# Patient Record
Sex: Male | Born: 1960 | Race: Black or African American | Hispanic: No | Marital: Single | Smoking: Former smoker
Health system: Southern US, Community
[De-identification: ages and names within clinical notes are randomized; demographics above are authoritative.]

## PROBLEM LIST (undated history)

## (undated) DIAGNOSIS — Z973 Presence of spectacles and contact lenses: Secondary | ICD-10-CM

## (undated) DIAGNOSIS — I779 Disorder of arteries and arterioles, unspecified: Secondary | ICD-10-CM

## (undated) DIAGNOSIS — E119 Type 2 diabetes mellitus without complications: Secondary | ICD-10-CM

## (undated) DIAGNOSIS — B1920 Unspecified viral hepatitis C without hepatic coma: Secondary | ICD-10-CM

## (undated) DIAGNOSIS — R519 Headache, unspecified: Secondary | ICD-10-CM

## (undated) DIAGNOSIS — I739 Peripheral vascular disease, unspecified: Secondary | ICD-10-CM

## (undated) DIAGNOSIS — I10 Essential (primary) hypertension: Secondary | ICD-10-CM

## (undated) DIAGNOSIS — G4700 Insomnia, unspecified: Secondary | ICD-10-CM

## (undated) DIAGNOSIS — M545 Low back pain, unspecified: Secondary | ICD-10-CM

## (undated) DIAGNOSIS — G8929 Other chronic pain: Secondary | ICD-10-CM

## (undated) DIAGNOSIS — E785 Hyperlipidemia, unspecified: Secondary | ICD-10-CM

## (undated) DIAGNOSIS — R51 Headache: Secondary | ICD-10-CM

## (undated) HISTORY — PX: COLONOSCOPY: SHX174

## (undated) HISTORY — PX: ANTERIOR CERVICAL DECOMP/DISCECTOMY FUSION: SHX1161

## (undated) HISTORY — DX: Insomnia, unspecified: G47.00

## (undated) HISTORY — PX: BACK SURGERY: SHX140

## (undated) HISTORY — DX: Peripheral vascular disease, unspecified: I73.9

## (undated) HISTORY — DX: Hyperlipidemia, unspecified: E78.5

## (undated) HISTORY — DX: Unspecified viral hepatitis C without hepatic coma: B19.20

## (undated) HISTORY — PX: CARPAL TUNNEL RELEASE: SHX101

## (undated) HISTORY — DX: Essential (primary) hypertension: I10

## (undated) HISTORY — DX: Disorder of arteries and arterioles, unspecified: I77.9

---

## 2003-11-15 ENCOUNTER — Emergency Department (HOSPITAL_COMMUNITY): Admission: EM | Admit: 2003-11-15 | Discharge: 2003-11-15 | Payer: Self-pay | Admitting: Emergency Medicine

## 2003-12-03 ENCOUNTER — Inpatient Hospital Stay (HOSPITAL_COMMUNITY): Admission: EM | Admit: 2003-12-03 | Discharge: 2003-12-05 | Payer: Self-pay | Admitting: Emergency Medicine

## 2003-12-06 ENCOUNTER — Emergency Department (HOSPITAL_COMMUNITY): Admission: EM | Admit: 2003-12-06 | Discharge: 2003-12-06 | Payer: Self-pay | Admitting: Family Medicine

## 2004-01-04 ENCOUNTER — Ambulatory Visit: Payer: Self-pay | Admitting: Family Medicine

## 2004-01-30 ENCOUNTER — Ambulatory Visit: Payer: Self-pay | Admitting: Family Medicine

## 2004-02-06 ENCOUNTER — Emergency Department (HOSPITAL_COMMUNITY): Admission: EM | Admit: 2004-02-06 | Discharge: 2004-02-06 | Payer: Self-pay | Admitting: Family Medicine

## 2004-02-20 ENCOUNTER — Ambulatory Visit: Payer: Self-pay | Admitting: Family Medicine

## 2004-02-26 ENCOUNTER — Ambulatory Visit: Payer: Self-pay | Admitting: Sports Medicine

## 2004-03-12 ENCOUNTER — Ambulatory Visit: Payer: Self-pay

## 2004-04-14 ENCOUNTER — Ambulatory Visit: Payer: Self-pay | Admitting: Family Medicine

## 2004-05-16 ENCOUNTER — Ambulatory Visit: Payer: Self-pay | Admitting: Sports Medicine

## 2004-07-17 ENCOUNTER — Ambulatory Visit: Payer: Self-pay | Admitting: Family Medicine

## 2004-10-02 ENCOUNTER — Ambulatory Visit: Payer: Self-pay | Admitting: Family Medicine

## 2004-10-27 ENCOUNTER — Ambulatory Visit: Payer: Self-pay | Admitting: Family Medicine

## 2004-11-26 ENCOUNTER — Ambulatory Visit: Payer: Self-pay | Admitting: Physical Medicine & Rehabilitation

## 2004-11-26 ENCOUNTER — Encounter
Admission: RE | Admit: 2004-11-26 | Discharge: 2005-02-24 | Payer: Self-pay | Admitting: Physical Medicine & Rehabilitation

## 2005-01-29 ENCOUNTER — Ambulatory Visit: Payer: Self-pay | Admitting: Physical Medicine & Rehabilitation

## 2005-02-18 ENCOUNTER — Ambulatory Visit: Payer: Self-pay | Admitting: Family Medicine

## 2005-02-20 ENCOUNTER — Ambulatory Visit: Payer: Self-pay | Admitting: Sports Medicine

## 2005-02-27 ENCOUNTER — Encounter
Admission: RE | Admit: 2005-02-27 | Discharge: 2005-05-28 | Payer: Self-pay | Admitting: Physical Medicine & Rehabilitation

## 2005-04-29 ENCOUNTER — Ambulatory Visit: Payer: Self-pay | Admitting: Physical Medicine & Rehabilitation

## 2005-05-06 ENCOUNTER — Ambulatory Visit: Payer: Self-pay | Admitting: Family Medicine

## 2005-05-15 ENCOUNTER — Ambulatory Visit: Payer: Self-pay | Admitting: Sports Medicine

## 2005-05-29 ENCOUNTER — Ambulatory Visit: Payer: Self-pay | Admitting: Family Medicine

## 2005-06-11 ENCOUNTER — Ambulatory Visit: Payer: Self-pay | Admitting: Physical Medicine & Rehabilitation

## 2005-06-11 ENCOUNTER — Encounter
Admission: RE | Admit: 2005-06-11 | Discharge: 2005-09-09 | Payer: Self-pay | Admitting: Physical Medicine & Rehabilitation

## 2005-07-14 ENCOUNTER — Ambulatory Visit: Payer: Self-pay | Admitting: Physical Medicine & Rehabilitation

## 2005-08-13 ENCOUNTER — Ambulatory Visit: Payer: Self-pay | Admitting: Physical Medicine & Rehabilitation

## 2005-10-08 ENCOUNTER — Ambulatory Visit: Payer: Self-pay | Admitting: Family Medicine

## 2005-10-08 ENCOUNTER — Ambulatory Visit: Payer: Self-pay | Admitting: Physical Medicine & Rehabilitation

## 2005-10-08 ENCOUNTER — Encounter
Admission: RE | Admit: 2005-10-08 | Discharge: 2006-01-06 | Payer: Self-pay | Admitting: Physical Medicine & Rehabilitation

## 2006-01-05 ENCOUNTER — Ambulatory Visit: Payer: Self-pay | Admitting: Physical Medicine & Rehabilitation

## 2006-01-05 ENCOUNTER — Encounter
Admission: RE | Admit: 2006-01-05 | Discharge: 2006-04-05 | Payer: Self-pay | Admitting: Physical Medicine & Rehabilitation

## 2006-03-30 ENCOUNTER — Ambulatory Visit: Payer: Self-pay | Admitting: Physical Medicine & Rehabilitation

## 2006-05-26 ENCOUNTER — Ambulatory Visit: Payer: Self-pay | Admitting: Physical Medicine & Rehabilitation

## 2006-05-26 ENCOUNTER — Encounter
Admission: RE | Admit: 2006-05-26 | Discharge: 2006-08-24 | Payer: Self-pay | Admitting: Physical Medicine & Rehabilitation

## 2006-07-01 DIAGNOSIS — E785 Hyperlipidemia, unspecified: Secondary | ICD-10-CM

## 2006-07-01 DIAGNOSIS — F17200 Nicotine dependence, unspecified, uncomplicated: Secondary | ICD-10-CM | POA: Insufficient documentation

## 2006-07-01 DIAGNOSIS — E782 Mixed hyperlipidemia: Secondary | ICD-10-CM | POA: Insufficient documentation

## 2006-07-01 DIAGNOSIS — E1159 Type 2 diabetes mellitus with other circulatory complications: Secondary | ICD-10-CM | POA: Insufficient documentation

## 2006-07-01 DIAGNOSIS — E119 Type 2 diabetes mellitus without complications: Secondary | ICD-10-CM | POA: Insufficient documentation

## 2006-07-01 DIAGNOSIS — E1169 Type 2 diabetes mellitus with other specified complication: Secondary | ICD-10-CM | POA: Insufficient documentation

## 2006-07-01 HISTORY — DX: Type 2 diabetes mellitus without complications: E11.9

## 2006-07-01 HISTORY — DX: Mixed hyperlipidemia: E78.2

## 2006-07-21 ENCOUNTER — Ambulatory Visit: Payer: Self-pay | Admitting: Physical Medicine & Rehabilitation

## 2006-09-14 ENCOUNTER — Ambulatory Visit: Payer: Self-pay | Admitting: Physical Medicine & Rehabilitation

## 2006-09-14 ENCOUNTER — Encounter
Admission: RE | Admit: 2006-09-14 | Discharge: 2006-12-13 | Payer: Self-pay | Admitting: Physical Medicine & Rehabilitation

## 2006-11-12 ENCOUNTER — Ambulatory Visit: Payer: Self-pay | Admitting: Physical Medicine & Rehabilitation

## 2007-01-14 ENCOUNTER — Ambulatory Visit: Payer: Self-pay | Admitting: Family Medicine

## 2007-01-14 LAB — CONVERTED CEMR LAB
Cholesterol, target level: 200 mg/dL
LDL Goal: 100 mg/dL

## 2007-01-21 ENCOUNTER — Ambulatory Visit: Payer: Self-pay | Admitting: Family Medicine

## 2007-01-21 ENCOUNTER — Encounter (INDEPENDENT_AMBULATORY_CARE_PROVIDER_SITE_OTHER): Payer: Self-pay | Admitting: Family Medicine

## 2007-01-21 LAB — CONVERTED CEMR LAB
BUN: 13 mg/dL (ref 6–23)
CO2: 21 meq/L (ref 19–32)
Cholesterol: 157 mg/dL (ref 0–200)
Creatinine, Ser: 1.07 mg/dL (ref 0.40–1.50)
Glucose, Bld: 100 mg/dL — ABNORMAL HIGH (ref 70–99)
HDL: 54 mg/dL (ref >39)
Sodium: 140 meq/L (ref 135–145)
Total Bilirubin: 0.3 mg/dL (ref 0.3–1.2)
Total Protein: 7.4 g/dL (ref 6.0–8.3)
Triglycerides: 135 mg/dL (ref <150)
VLDL: 27 mg/dL (ref 0–40)

## 2007-02-08 ENCOUNTER — Ambulatory Visit: Payer: Self-pay | Admitting: Physical Medicine & Rehabilitation

## 2007-02-08 ENCOUNTER — Encounter
Admission: RE | Admit: 2007-02-08 | Discharge: 2007-02-09 | Payer: Self-pay | Admitting: Physical Medicine & Rehabilitation

## 2007-02-16 ENCOUNTER — Ambulatory Visit: Payer: Self-pay | Admitting: Family Medicine

## 2007-03-25 ENCOUNTER — Ambulatory Visit: Payer: Self-pay | Admitting: Family Medicine

## 2007-05-03 ENCOUNTER — Encounter
Admission: RE | Admit: 2007-05-03 | Discharge: 2007-07-14 | Payer: Self-pay | Admitting: Physical Medicine & Rehabilitation

## 2007-05-03 ENCOUNTER — Ambulatory Visit: Payer: Self-pay | Admitting: Physical Medicine & Rehabilitation

## 2007-05-27 ENCOUNTER — Ambulatory Visit: Payer: Self-pay | Admitting: Family Medicine

## 2007-05-27 DIAGNOSIS — L280 Lichen simplex chronicus: Secondary | ICD-10-CM | POA: Insufficient documentation

## 2007-06-08 ENCOUNTER — Telehealth: Payer: Self-pay | Admitting: *Deleted

## 2007-06-13 ENCOUNTER — Ambulatory Visit: Payer: Self-pay | Admitting: Sports Medicine

## 2007-06-13 ENCOUNTER — Encounter (INDEPENDENT_AMBULATORY_CARE_PROVIDER_SITE_OTHER): Payer: Self-pay | Admitting: Family Medicine

## 2007-06-13 DIAGNOSIS — L0100 Impetigo, unspecified: Secondary | ICD-10-CM

## 2007-06-13 DIAGNOSIS — R042 Hemoptysis: Secondary | ICD-10-CM | POA: Insufficient documentation

## 2007-06-13 LAB — CONVERTED CEMR LAB
ALT: 58 units/L — ABNORMAL HIGH (ref 0–53)
AST: 35 units/L (ref 0–37)
Bilirubin, Direct: 0.1 mg/dL (ref 0.0–0.3)

## 2007-06-15 ENCOUNTER — Telehealth (INDEPENDENT_AMBULATORY_CARE_PROVIDER_SITE_OTHER): Payer: Self-pay | Admitting: Family Medicine

## 2007-06-24 ENCOUNTER — Encounter (INDEPENDENT_AMBULATORY_CARE_PROVIDER_SITE_OTHER): Payer: Self-pay | Admitting: *Deleted

## 2007-06-24 ENCOUNTER — Ambulatory Visit: Payer: Self-pay | Admitting: Family Medicine

## 2007-06-24 DIAGNOSIS — L02219 Cutaneous abscess of trunk, unspecified: Secondary | ICD-10-CM

## 2007-06-24 DIAGNOSIS — L03319 Cellulitis of trunk, unspecified: Secondary | ICD-10-CM

## 2007-06-25 ENCOUNTER — Encounter (INDEPENDENT_AMBULATORY_CARE_PROVIDER_SITE_OTHER): Payer: Self-pay | Admitting: *Deleted

## 2007-07-06 ENCOUNTER — Encounter (INDEPENDENT_AMBULATORY_CARE_PROVIDER_SITE_OTHER): Payer: Self-pay | Admitting: Family Medicine

## 2007-07-06 ENCOUNTER — Telehealth (INDEPENDENT_AMBULATORY_CARE_PROVIDER_SITE_OTHER): Payer: Self-pay | Admitting: Family Medicine

## 2007-07-21 ENCOUNTER — Encounter
Admission: RE | Admit: 2007-07-21 | Discharge: 2007-07-22 | Payer: Self-pay | Admitting: Physical Medicine & Rehabilitation

## 2007-07-21 ENCOUNTER — Ambulatory Visit: Payer: Self-pay | Admitting: Physical Medicine & Rehabilitation

## 2010-06-22 ENCOUNTER — Encounter: Payer: Self-pay | Admitting: *Deleted

## 2010-08-05 ENCOUNTER — Encounter: Payer: Self-pay | Admitting: Home Health Services

## 2010-08-27 ENCOUNTER — Encounter: Payer: Self-pay | Admitting: Home Health Services

## 2010-08-27 ENCOUNTER — Ambulatory Visit (INDEPENDENT_AMBULATORY_CARE_PROVIDER_SITE_OTHER): Payer: Medicare Other | Admitting: Home Health Services

## 2010-08-27 VITALS — BP 141/87 | HR 105 | Temp 97.9°F | Ht 67.0 in | Wt 211.8 lb

## 2010-08-27 DIAGNOSIS — Z0000 Encounter for general adult medical examination without abnormal findings: Secondary | ICD-10-CM

## 2010-08-27 NOTE — Progress Notes (Signed)
Patient here for annual wellness visit, patient reports: Risk Factors/Conditions needing evaluation or treatment: Patient does not have any new risk factors that need evaluation. Home Safety: Patient is currently in the process of purchasing a home in [place].  Other Information: Corrective lens: Patient has daily corrective lens and visits eye doctor as needed. Dentures: Patient does not have dentures and visits dentist as needed.  Memory: Patient reports some memory loss. Patient's Mini Mental Score (recorded in doc. flowsheet): 30   Balance max value patientvalue  Sitting balance 1 1  Arise 2 2  Attempts to arise 2 2  Immediate standing balance 2 2  Standing balance 1 1  Nudge 2 2  Eyes closed 1 1  360 degree turn 1 1  Sitting down 2 2   Gait max value patient value  Initiation of gait 1 1  Step length-left 1 1  Step length-right 1 1  Step height-left 1 1  Step height-right 1 1  Step symmetry 1 1  Step continuity 1 1  Path 2 2  Trunk 2 2  Walking stance 1 1   Balance/Gait Score: 26/26    Annual Wellness Visit Requirements Recorded Today In  Medical, family, social history Past Medical, Family, Social History Section  Current providers Care team  Current medications Medications  Wt, BP, Ht, BMI Vital signs  Hearing assessment (welcome visit) declined  Tobacco, alcohol, illicit drug use History  ADL Nurse Assessment  Depression Screening Nurse Assessment  Cognitive impairment Nurse Assessment  Mini Mental Status Document Flowsheet  Fall Risk Nurse Assessment  Home Safety Progress Note  End of Life Planning (welcome visit) Social Documentation  Medicare preventative services Progress Note  Risk factors/conditions needing evaluation/treatment Progress Note  Personalized health advice Patient Instructions, goals, letter  Diet & Exercise Social Documentation  Emergency Contact Social Documentation  Seat Belts Social Documentation  Sun exposure/protection  Social Documentation    Medicare Prevention Plan: Patient has not had an appointment with [facility] for past two years.  He will complete most of the preventative service upon a visit with his pcp.  Recommended Medicare Prevention Screenings Men over 50 Test For Frequency Date of Last- BOLD if needed  Colorectal Cancer 1-10 yrs Recommended when pt turns 50   Prostate Cancer Never or yearly Discuss with pcp  Aortic Aneurysm Once if 65-75 with hx of smoking Not indicated  Cholesterol 5 yrs 9/08  Diabetes yearly Will check with pcp  HIV yearly Declined  Influenza Shot yearly recommended  Pneumonia Shot once recommended  Zostavax Shot once recommended

## 2010-08-27 NOTE — Patient Instructions (Signed)
1. Congratulations on quitting smoking!  Keep up the great work! 2. Continue to take all your medicines regularly. 3. Start to exercise 2-3 times a week.

## 2010-09-11 ENCOUNTER — Ambulatory Visit (INDEPENDENT_AMBULATORY_CARE_PROVIDER_SITE_OTHER): Payer: Medicare Other | Admitting: Family Medicine

## 2010-09-11 ENCOUNTER — Encounter: Payer: Self-pay | Admitting: Family Medicine

## 2010-09-11 VITALS — BP 128/75 | HR 106 | Temp 97.4°F | Ht 67.0 in | Wt 215.3 lb

## 2010-09-11 DIAGNOSIS — Z20828 Contact with and (suspected) exposure to other viral communicable diseases: Secondary | ICD-10-CM

## 2010-09-11 DIAGNOSIS — E119 Type 2 diabetes mellitus without complications: Secondary | ICD-10-CM

## 2010-09-11 DIAGNOSIS — M549 Dorsalgia, unspecified: Secondary | ICD-10-CM

## 2010-09-11 DIAGNOSIS — R300 Dysuria: Secondary | ICD-10-CM

## 2010-09-11 DIAGNOSIS — Z202 Contact with and (suspected) exposure to infections with a predominantly sexual mode of transmission: Secondary | ICD-10-CM

## 2010-09-11 DIAGNOSIS — R7309 Other abnormal glucose: Secondary | ICD-10-CM

## 2010-09-11 DIAGNOSIS — Z113 Encounter for screening for infections with a predominantly sexual mode of transmission: Secondary | ICD-10-CM

## 2010-09-11 DIAGNOSIS — G8929 Other chronic pain: Secondary | ICD-10-CM

## 2010-09-11 DIAGNOSIS — R739 Hyperglycemia, unspecified: Secondary | ICD-10-CM

## 2010-09-11 NOTE — Patient Instructions (Signed)
We'll wait for the records from the pain clinic. I'll let you know the results from your test. Your A1C was 7.2.

## 2010-09-12 LAB — GC/CHLAMYDIA PROBE AMP, URINE
Chlamydia, Swab/Urine, PCR: NEGATIVE
GC Probe Amp, Urine: NEGATIVE

## 2010-09-12 LAB — RPR

## 2010-09-13 ENCOUNTER — Encounter: Payer: Self-pay | Admitting: Family Medicine

## 2010-09-14 NOTE — Progress Notes (Signed)
  Subjective:    Patient ID: Wyatt Sandoval, male    DOB: [birth year], 50 y.o.   MRN: [id number]  HPI Patient previously a patient here but has not been seen for past 3 years so filled out "new patient" paperwork.  Lived in [place] for past several years, now moving back to [place] and wants to establish care again.     1.  Diabetes:  Currently on Metformin.  No adverse effects.  No hypoglycemic events.  No paresthesia or peripheral nerve pain.  Measures blood sugars at home every:     Lab Results  Component Value Date   HGBA1C 7.2 09/11/2010      Review of Systems See HPI above for review of systems.       Objective:   Physical Exam Gen:  Alert, cooperative patient who appears stated age in no acute distress.  Vital signs reviewed. HEENT:  [place]/AT.  EOMI, PERRL.  MMM, tonsils non-erythematous, non-edematous.  External ears WNL, Bilateral TM's normal without retraction, redness or bulging.  Cardiac:  Regular rate and rhythm without murmur auscultated.  Good S1/S2. Pulm:  Clear to auscultation bilaterally with good air movement.  No wheezes or rales noted.   Abd:  Soft/nondistended/nontender.  Good bowel sounds throughout all four quadrants.  No masses noted.  MSK:  some tenderness along lumbosacral back bilaterally to palpation.   Ext:  No clubbing/cyanosis/erythema.  No edema noted bilateral lower extremities.      Assessment & Plan:

## 2010-09-15 DIAGNOSIS — G8929 Other chronic pain: Secondary | ICD-10-CM | POA: Insufficient documentation

## 2010-09-15 HISTORY — DX: Other chronic pain: G89.29

## 2010-09-15 NOTE — Assessment & Plan Note (Signed)
A1C 7.2, on Metformin.  Plan to continue current dose of Metformin, patient has been using infrequently.  Recheck A1C in 3 months, await for records from current PCP.

## 2010-09-15 NOTE — Assessment & Plan Note (Signed)
Awaiting records from [facility] pain clinic.  Patient asking for Oxycodone today but has prescription from pain clinic.  Takes high dose narcotics, will refer to pain clinic here rather than provide these.

## 2010-09-16 NOTE — Assessment & Plan Note (Signed)
Wyatt Sandoval returns to the clinic today for followup evaluation.  He  reports that he is getting reasonable relief from his methadone and his  oxycodone as noted below.  Does need refills on each of those over the  next couple days. He reports that he still has no primary care  physician, but plans to follow up with [facility] where  he has received prior medical attention.  He also needs to see an  ophthalmologist as he still reports vision difficulties, but he has set  no appointment at this time.  He reports his blood sugars has been as  low as 70 and occasionally are elevated above 200.   MEDICATIONS:  1. Trazodone 50 mg 3 tablets p.o. nightly p.r.n.  2. Methadone 10 mg 2 tablets b.i.d.  3. Oxycodone 5 mg 1-2 tablets p.o. t.i.d.  p.r.n.  (approximately 6      per day).  4. Lipitor 10 mg p.o. nightly.  5. Metformin 500 mg b.i.d.   REVIEW OF SYSTEMS:  Positive for weight gain.   PHYSICAL EXAMINATION:  A well-appearing, well-nourished adult male in  mild to no acute discomfort.  Blood pressure 119/70 with a pulse of 93,  respiratory rate 18 and O2 saturation 97% on room air.  He has 5/5 strength throughout. He ambulates without any assistive  device.   IMPRESSION:  1. Lumbar degenerative disc disease with probable S1 nerve root      irritation causing left lower extremity pain.  2. Cervical degenerative disc disease with history of cervical pain.    In the office today, we did refill the patient's methadone along with  his oxycodone as of November 19, 2006.  We will plan on seeing the patient  in followup in approximately 3 to 4 months' time with refills prior to  that appointment as necessary.           ______________________________  Ellwood Dense, M.D.     DC/MedQ  D:  11/15/2006 10:32:49  T:  11/15/2006 15:53:42  Job #:  [id number]

## 2010-09-16 NOTE — Progress Notes (Signed)
I have reviewed this visit and discussed with Suzanne Lineberry and agree with her documentation  

## 2010-09-16 NOTE — Assessment & Plan Note (Signed)
[name] returns to clinic today for follow-up evaluation.  Due to  the shortage of oxycodone, we have had to change him to Percocet 5/325  used 1-2 tablets p.o. t.i.d. p.r.n.  He does not feel that he is getting  the same amount of relief with the Percocet 5/325 as he was with the  oxycodone 5 mg.  I have tried to explain to him that the only difference  is the addition of Tylenol, but he is fairly sure that the relief is not  exactly the same.  He continues to take the methadone 2 tablets twice a  day.  His blood sugars have been under reasonable control according to  him.   MEDICATIONS:  Trazodone 50 mg 3 tablets p.o. q.h.s. p.r.n.; methadone 10  mg 2 tablets b.i.d.; Percocet 5/325 1-2 tablets p.o. t.i.d. p.r.n.;  Lipitor 10 mg p.o. q.h.s.; metformin 500 mg p.o. b.i.d.   REVIEW OF SYSTEMS:  Positive for high blood sugar.   PHYSICAL EXAMINATION:  Well-appearing, well-nourished adult male in mild  to no acute discomfort.  Blood pressure 119/54 with pulse 94.  Respiratory rate 18.  O2 saturation 96% on room air.  He has 5/5  strength throughout.  He ambulates without any assistive device.   IMPRESSION:  1. Lumbar degenerative disc disease with probable S1 nerve root      irritation causing left lower extremity pain.  2. Cervical degenerative disc disease with history of cervical pain.   In the office today we did switch the patient to a Percocet 7.5/325  instead of a 5/325.  It would still allow him the use of maximum of 6  per day.  We have also refilled his methadone and each was written for  August 05, 2007.  We will plan on seeing the patient in follow-up in  approximately 2-3 months' time with refills prior to that appointment as  necessary.           ______________________________  Ellwood Dense, M.D.     DC/MedQ  D:  07/22/2007 10:23:44  T:  07/22/2007 11:47:53  Job #:  [id number]

## 2010-09-16 NOTE — Assessment & Plan Note (Signed)
[name] returns to the clinic today for followup evaluation.  He  reports the [facility] has recommended that he  start taking blood pressure medicine and aspirin.  He has really not  filled those prescriptions and he is not sure that he wants to at the  present time.  His blood sugars have been in the 95-100 range with  occasional readings being the 50-60 range, tested first thing in the  morning.  He does not check his blood sugar other times during the day.   He is getting reasonable relief from the methadone and oxycodone and  needs refills on each of those over the next few days.   MEDICATIONS:  1. Trazodone 50 mg, three tablets p.o. q.h.s. p.r.n.  2. Methadone 10 mg two tablets b.i.d.  3. Oxycodone 5 mg one to two tablets p.o. t.i.d. p.r.n. (approximately      6 per day).  4. Lipitor 10 mg p.o. q.h.s.  5. Metformin 500 mg p.o. b.i.d.   REVIEW OF SYSTEMS:  Noncontributory.   PHYSICAL EXAMINATION:  GENERAL:  A well-appearing, well-nourished, adult  male in mild to no acute discomfort.  VITAL SIGNS:  Blood pressure 148/92, with a pulse of 89, respiratory  rate is 18, and O2 saturation 98% on room air.  MUSCULOSKELETAL:  He has 5/5 strength throughout.  He ambulates without  any assistive device.   IMPRESSION:  1. Lumbar degenerative disk disease with probable S1 nerve root      irritation causing left lower extremity  pain.  2. Cervical degenerative disk disease with a history of cervical pain.   1. In the office today, we did refill the patient's methadone along      with his oxycodone as of May 13, 2007.  2. I have asked him to check his blood sugar in the afternoon before      the evening meal to see if he as good control as he does  first thing in the morning.  1. I have also asked him to reconsider using the blood pressure      medicines since he has had occasional readings which have been      elevated in the office.  Other times his blood  pressure has been      well controlled off medication when we have seen him in followup.      He needs to make that decision on his own but it has been      recommended and he may be developing more hypertension lately with      the readings slightly more elevated.  2. We will plan to see the patient in followup in this office in      approximately 3 months' time with refills prior to that appointment      as necessary.           ______________________________  Ellwood Dense, M.D.     DC/MedQ  D:  05/06/2007 13:31:46  T:  05/06/2007 14:37:23  Job #:  [id number]

## 2010-09-16 NOTE — Assessment & Plan Note (Signed)
The patient returns to clinic today for followup evaluation.  He puts it  overall he is doing well.  He continues to use his methadone and  oxycodone - as noted below.  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  He  has lost 20 pounds as he has been more active.  He tries to stay as  active as possible even when he is not working.  He has not been  checking his blood sugars but knows he needs to get back in to see his  primary care physician for diabetes and blood pressure along with  cholesterol control.   MEDICATIONS:  1. Trazodone 50 mg, 3 tablets p.o. q. h.s. p.r.n.  2. Methadone 10 mg, 2 tablets p.o. b.i.d.  3. Oxycodone 5 mg, 1-2 tablets p.o. t.i.d. p.r.n. (approximately 6 per      day).  4. Lipitor 10 mg q. h.s.  5. Metformin 500 mg b.i.d.   REVIEW OF SYSTEMS:  Positive for high blood sugar.   PHYSICAL EXAMINATION:  A reasonably well-appearing, well-nourished,  adult male in mild to no acute discomfort.  Blood pressure is 117/55  with a pulse of 90, respiratory rate 16 and 02 saturation 96% on room  air.  He has 5/5 strength throughout.  He ambulates without any  assistive device.   IMPRESSION:  1. Lumbar degenerative disc disease with probable S1 nerve root      irritation causing left lower extremity pain.  2. Cervical degenerative disc disease with history of cervical pain.   PLAN:  In the office today, we did refill the patient's methadone and  his oxycodone - each as of Sep 23, 2006.  We will plan on seeing him in  followup in approximately 2-3 months' time with refills prior to that  appointment as necessary.  He continues to get good analgesic effect.  I  will allow him to return to work.  No significant adverse side effects  are noted.           ______________________________  Ellwood Dense, M.D.     DC/MedQ  D:  09/16/2006 10:41:51  T:  09/16/2006 11:59:40  Job #:  [id number]

## 2010-09-16 NOTE — Assessment & Plan Note (Signed)
[name] returns to clinic today for followup evaluation.  He  continues to get his primary care through [facility].  The patient reports that he is getting good relief from the methadone  and oxycodone as noted below.  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  He also reports that he tries to be  active, walking on a regular basis.  He reports that he has increased  pain when he is seated for any prolonged period of time, especially if  he is leaning towards the right.  He reports that his blood sugars have  been in the 95 to 100 range.   MEDICATIONS:  1. Trazodone 50 mg 3 tablets p.o. nightly p.r.n.  2. Methadone 10 mg 2 tablets b.i.d.  3. Oxycodone 5 mg 1-2 tablets p.o. t.i.d. p.r.n. (approximately 6 per      day).  4. Lipitor 10 mg p.o. nightly.  5. Metformin 500 mg p.o. b.i.d.   REVIEW OF SYSTEMS:  Noncontributory.   PHYSICAL EXAMINATION:  A well-appearing, well-nourished adult male in  mild, acute discomfort.  Blood pressure 104/48 with a pulse of 107, respiratory rate 18, and O2  saturation 96% on room air.  He has 5/5 strength throughout the bilateral upper and lower  extremities.  He has decreased range of motion of his lumbar spine.  He  ambulates without any assistive device.   IMPRESSION:  1. Lumbar degenerative disk disease with probable S1 nerve root      irritation causing left lower extremity pain.  2. Cervical degenerative disk disease with history of cervical pain.  3. In the office today, we did refill the patient's oxycodone and      methadone as of February 13, 2007.  Will plan on seeing the patient      in followup in this office in approximately 3 months' time with      refills prior to that appointment if necessary.           ______________________________  Ellwood Dense, M.D.     DC/MedQ  D:  02/09/2007 09:20:00  T:  02/09/2007 12:12:06  Job #:  [id number]

## 2010-09-17 ENCOUNTER — Other Ambulatory Visit: Payer: Self-pay | Admitting: Family Medicine

## 2010-09-17 DIAGNOSIS — M549 Dorsalgia, unspecified: Secondary | ICD-10-CM

## 2010-09-18 ENCOUNTER — Telehealth: Payer: Self-pay | Admitting: Family Medicine

## 2010-09-18 NOTE — Telephone Encounter (Signed)
I am going to fax this to another pain clinic.Wyatt Sandoval

## 2010-09-18 NOTE — Telephone Encounter (Signed)
April calling to let MD know their doctor reviewed pts chart, pt was referred there in the past by previous doctor & is declining to see pt there, he does not have anything to offer pt.

## 2010-09-19 NOTE — Assessment & Plan Note (Signed)
INTERVAL HISTORY:  Mr. Pottenger returns to clinic today for followup  evaluation.  I last saw the patient in this office April 30, 2005.  We  had been using oxycodone 5 mg one to two tablets p.o. t.i.d. p.r.n. a  maximum of six per day had been prescribed.  Unfortunately, the patient has  experienced substantially increased low back pain without any exacerbating  event.  He apparently has used 153 tablets in the past 13 days.  He has 27  remaining from a prescription dated May 31, 2005.  The patient is not  getting adequate relief with the oxycodone and would like to have his pain  medicines adjusted.  He reports that he is using the trazodone 50 mg three  tablets at night and only getting mild help with his sleep.  He also has  previously tried Lorcet, hydrocodone and Percocet without significant  benefit.   MEDICATIONS:  1.  Trazodone 50 mg three tablets p.o. at bedtime.  2.  Oxycodone 5 mg one to two tablets p.o. five to six times per day (10-12      per day).   REVIEW OF SYSTEMS:  Noncontributory.   PHYSICAL EXAMINATION:  Well-appearing fit adult male in mild acute  discomfort.  Blood pressure __________ .  Reflexes were 2+ and symmetrical  and sensation was intact to light touch throughout.  Lumbar range of motion  was decreased in all planes.   IMPRESSION:  1.  Lumbar degenerative disk disease with probable S1 nerve root irritation      causing lower extremity pain.  2.  Cervical degenerative disk disease with history of cervical pain.   In the office today we did refill the patient's oxycodone but asked him to  use no more than six tablets per day.  We have added oxycodone extended  release 20 mg one tablet q.12 h and that prescription is written for today.  The oxycodone that I mentioned above is written for June 15, 2005 as he  has 27 remaining from his last prescription.  I have told him that he needs  to limit the use of oxycodone   Dictation ended at this  point.           ______________________________  Ellwood Dense, M.D.     DC/MedQ  D:  06/12/2005 13:23:41  T:  06/12/2005 23:21:46  Job #:  [id number]

## 2010-09-19 NOTE — Assessment & Plan Note (Signed)
HISTORY OF PRESENT ILLNESS:  [name] returns to the clinic today for  follow up evaluation.  Overall, he is doing reasonably well.  He still gets  a good amount of relief with his methadone and oxycodone as noted below.  He  does still have persistent numbness in the toes of his left foot.  He does  need a refill on his Trazodone which he uses for his sleep.   The patient reports that his blood sugars have been in the 110 to 120 range  at home.   MEDICATIONS:  1. Trazodone 50 mg 3 tablets p.o. q.h.s.  2. Methadone 10 mg 2 tablets p.o. b.i.d.  3. Oxycodone 5 mg 1-2 tablet p.o. t.i.d. p.r.n. (approximately 2-6 per      day).  4. Lipitor 10 mg daily.  5. Metformin 500 mg b.i.d.   REVIEW OF SYSTEMS:  Noncontributory.   PHYSICAL EXAMINATION:  GENERAL APPEARANCE:  Well-appearing, well-nourished  adult male in mild acute discomfort.  VITAL SIGNS:  Blood pressure 152/70, pulse 113, respirations 18, O2  saturation 98% on room air.  EXTREMITIES:  He has 5/5 strength throughout the bilateral upper and lower  extremities.  Bulk and tone were normal.  Reflexes were 3+ and symmetrical.   IMPRESSION:  1. Lumbar degenerative disk disease with probable S1 nerve root irritation      causing left lower extremity pain.  2. Cervical degenerative disk disease with history of cervical pain.   PLAN:  In the office today, we did refill the patient's Oxycodone, methadone  and trazodone as noted above.  He is getting good analgesic effect, and  there is no sign of adverse side effects.  We plan on seeing him in follow  up in this office in approximately three months time with refills prior to  that appointment if necessary.           ______________________________  Ellwood Dense, M.D.     DC/MedQ  D:  01/06/2006 15:16:07  T:  01/07/2006 06:50:22  Job #:  [id number]

## 2010-09-19 NOTE — Group Therapy Note (Signed)
MEDICAL RECORD #[id number]   REFERRAL:  [name] (primary care physician).   PURPOSE OF EVALUATION:  Evaluate and treat chronic back pain and neck pain.   HISTORY OF THE PRESENT ILLNESS:  Wyatt Sandoval is a 50 year old adult male  referred to this office by his primary care physician for chronic pain  management.   I have some minimal medical records that I will review in preparation of  this dictation.   It appears that the patient was referred to [name], a local  neurosurgeon, November 26, 2003 on referral from the emergency room for low-back  pain. At that time, [name] requested an MRI scan of his lumbar spine  and prescribed the patient Vicodin and Motrin.   November 29, 2003 an MRI scan of the lumbar spine reportedly showed L5-S1  posterolateral disc protrusion on the left posteriorly, displacing the left  S1 nerve root. At L4-5 there was shallow extraforaminal disc protrusion on  the right which could encroach on the right L4 nerve root. There was mild  disc bulge at L3-4 without resulting spinal stenosis or nerve root  encroachment. There was mild facet degenerative changes bilaterally in the  lower three levels.   The patient was seen back by [name] December 11, 2003. At that time the  patient declined back injections. He did not wish for any type of surgery.  He was started on Voltaren and told to follow up with [name] to evaluate  his cervical spine. The patient had a history of multiple neck surgery with  persistent neck pain.   January 29, 2004 the patient saw [name], a local orthopedist. At that  time, [name] requested a cervical MRI scan. The patient was requesting  narcotics and [name] gave him Ultram t.i.d. p.r.n.   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 AP diameter of the canal from C4 to the  level of C5-6 is 7-8 mm with deformation at these levels. No abnormal cord  signal was identified.   February 13, 2004 [name] saw the patient in follow-up. He reported that  the MRI scan showed solid fusion at C3-4 and C6-7. Again, the patient  declined epidural steroid injections for the lumbar herniated nucleus  pulposus. They apparently discussed surgery but the patient decided to  decline. [name] had recommended no narcotic medications for this patient.   July 17, 2004 the patient saw [name] back and continued to complain of  neck pain. He was referred here at that time.   Since being seen by [name], the patient reports that he did see a Dr.  Ladon Applebaum at [facility] He was apparently prescribed hydrocodone  7.5/650 one tablet p.o. t.i.d. to q.i.d. as of approximately 2-3 months ago.  He has been using that and getting some relief.   Presently, the patient complains of low-back and cervical pain. He reports  that his pain is worse in his low back with radiation into his left leg. He  reports that he does get some relief with the hydrocodone. He also reports  very poor sleep despite using Ambien 10 mg q.h.s. He also reports having  used Ambien CR in the past. He reports getting no relief with the Ultram. He  reports that his blood sugars have been in the 100-120 range. He denies any  bowel or bladder incontinence. He reports that he tries to do what he can  around the home and tries to walk as much as he can tolerate. He complains  of a slight numbness of his left hand with good upper extremity strength. He  reports that his back is stiff, especially in the morning.   The patient subsequently almost casually reports that he has had abnormal  liver function tests. He is not sure exactly the cause of this but he  reports that he was told by an unknown physician that he had  abnormalities  of his liver.   PAST MEDICAL HISTORY:  1.  Non-insulin-dependent diabetes mellitus on Glucophage.  2.  Tobacco abuse.  3.  Dyslipidemia, on Lipitor.  4.  Cervical surgery x2 in 2000 and 2003.  5.  Reported abnormal liver function.   FAMILY HISTORY:  Positive for heart disease, hypertension, cerebral palsy.  His mother has cerebral palsy and is living. His dad is deceased from heart  disease.   ALLERGIES:  No known drug allergies.   SOCIAL HISTORY:  The patient is single although he has fathered three  children. He lives with a girlfriend at this time. He is on disability since  2005 for neck pain. He smokes one-and-a-half packs per day of cigarettes and  diagnosis alcohol intake.   MEDICATIONS:  1.  Hydrocodone 7.5/650 one tablet t.i.d. p.r.n.  2.  Ambien 10 mg q.h.s.  3.  Metformin 500 mg b.i.d.  4.  Lipitor 10 mg daily.   REVIEW OF SYSTEMS:  Positive for high blood sugar.   PHYSICAL EXAMINATION:  A reasonably well-appearing, moderately-overweight  adult male in mild to no acute discomfort. Blood pressure 114/64 with a  pulse of 94, respiratory rate 16, and O2 saturation 98% on room air. The  patient was pleasant and cooperative throughout the evaluation. He reports a  height of 5 feet 7 inches and a weight of 230 pounds. He is able to toe-walk  and heel-walk without difficulty. Lumbar range of motion was only minimally  decreased in flexion and good in extension. Lateral bending and rotation  were within normal limits. Cervical range of motion showed only slight  decrease in forward flexion and extension along with lateral bending and  rotation. Upper extremity range of motion was normal bilaterally. Upper  extremity exam showed 5-/5 strength throughout. Normal sensation was  reported and bulk and tone were normal. Reflexes are 2+ and symmetrical. The  patient did complain of some slight numbness of his left hand but then reported that his sensation was  normal in exam. Lower extremity exam showed  5/5 strength with slight decrease in sensation of the left instep. Reflexes  were 2+ at the bilateral knees and ankles. In the supine position, straight  leg raise was negative bilaterally. Hip range of motion was normal  bilaterally.   IMPRESSION:  1.  Lumbar degenerative disc disease without neurological compromise.  2.  Cervical degenerative disc disease with history of cervical surgery x2      with persistent pain.   In the office today, the patient certainly has abnormalities of his lumbar  spine as evidence by the November 29, 2003 MRI study. He has denied offering of  epidural steroid injections and [name] had also discussed surgery with  the patient but he is not interested in that. He has had less than full  benefit  from the cervical surgery and he is worried that the lumbar surgery  would be as problematic. The patient does get a fair amount of relief with  the hydrocodone but he also reports that he has had some liver  abnormalities. I have told him that we will need to get a liver function  test in the office today to see exactly what the status of his liver is  before we can prescription him any medication. He seems to actually want  stronger medication than the hydrocodone but I have told him that we need to  at least evaluate his liver, given the reports that he is giving [place] in the  office. I have offered to give him sleep medication and have written a  prescription for trazodone 50 mg one to two tablets p.o. q.h.s. I have told  him that we will see him follow-up in approximately 1 month's time to review  his liver function tests at that time to see if we can treat him more  aggressively than he is at present. Will plan on seeing him in follow-up in  approximately 1 month's time.       DC/MedQ  D:  11/28/2004 12:13:24  T:  11/28/2004 15:16:05  Job #:  [id number]

## 2010-09-19 NOTE — Assessment & Plan Note (Signed)
MEDICAL RECORD NUMBER:  [id number]   [name] returns to clinic today for follow-up evaluation. He reports  that he is getting reasonable relief with his oxycodone used 5 mg one to two  tablets p.o. t.i.d. He has occasionally used greater than six tablets per  day but for the most part uses up to six per day. 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 He needs to be certified as disabled,  according to the patient, before the vocational people will help him get a  future job which does not seem to make sense, but that is what he tells me.  He continues to smoke two packs of cigarettes per day and asks about  possible Nicorette gum for smoking cessation.   MEDICATIONS:  1.  Trazodone 50 mg three tablets p.o. q.h.s.  2.  Oxycodone 5 mg one to two tablets p.o. t.i.d. p.r.n. (maximum six per      day).   REVIEW OF SYSTEMS:  Positive for high blood sugar.   PHYSICAL EXAMINATION:  Well-appearing, fit adult male in mild acute  discomfort. Blood pressure 110/69 with a pulse of 75, respiratory rate 16,  and O2 saturation 99% on room air. He has 5/5 strength throughout the  bilateral upper and lower extremities. Reflexes were 2+ and symmetrical. He  ambulates without any assistive device.   IMPRESSION:  1.  Lumbar degenerative disc disease with probable S1 nerve root irritation      causing lower extremity pain.  2.  Cervical degenerative disc disease with history of cervical surgery x2      with persistent pain.   In the office today we did refill the patient's oxycodone at 5 mg one to two  tablets p.o. t.i.d. p.r.n., a total of 180. We also prescribed Nicorette  chewing gum 4 mg one every 2 hours, approximately eight to nine pieces per  day. He understands that he should not smoke when he uses the Nicorette gum.  We will plan on seeing the patient in follow-up in approximately 2-3 months'   time.           ______________________________  Ellwood Dense, M.D.     DC/MedQ  D:  04/30/2005 10:09:18  T:  04/30/2005 11:28:13  Job #:  [id number]

## 2010-09-19 NOTE — H&P (Signed)
NAME:  Wyatt Sandoval, Wyatt Sandoval NO.:  [id number]   MEDICAL RECORD NO.:  [id number]                   PATIENT TYPE:  INP   LOCATION:  3315                                 FACILITY:  [facility]   PHYSICIAN:  Jackie Plum, M.D.             DATE OF BIRTH:  [birth year]   DATE OF ADMISSION:  12/03/2003  DATE OF DISCHARGE:                                HISTORY & PHYSICAL   CHIEF COMPLAINT:  Polyuria, polydipsia and weakness.   HISTORY OF PRESENT ILLNESS:  The patient is a 50 year old African American  gentleman without any prior history of diabetes mellitus.  He presented with  2-week history of progressively worsening polyuria, polydipsia and visual  blurring.  Apparently, he had had some abdominal pain which was nonspecific  and complained of this to the ED staff, however, at the time of my  evaluation, the patient denied any abdominal pain whatsoever and did not  have any nausea or vomiting, though he had vomited once previously.  He said  that he was apparently in his usual state of health until about a week ago,  when he started having worsening neck and back pain (he has history of a  herniated disk).  At that time, he was started on prednisone with other  medicines.  Since then, he has been having problems with polyuria,  polydipsia and subsequent visual blurring.  He denies any history of nausea,  loss of appetite, diarrhea, constipation, weight loss, black or bloody  stools or dysuria.  He denies any history of fever or chills, headaches,  sore throat, cough, chest pain, shortness of breath, any skin rash or joint  pains.   PAST MEDICAL HISTORY:  Past medical history is notable for history of  herniated disk, status post cervical surgery in [place] in 2003.  He  denies any history of diabetes mellitus, heart disease or high blood  pressure.   CURRENT MEDICINES:  His current medicines include hydrocodone and prednisone  as well as a muscle relaxant.   These medications were prescribed for him by  Dr. Vanita Panda. Magnus Ivan, who also requested for an MRI for further  evaluation of his spine problems.  MRI report is not available in the  computer at [facility] at the time of my evaluation.   ALLERGIES:  He does not have any known medication allergies.   FAMILY HISTORY:  Family history is positive for heart disease in his  grandmother.  His grandfather had heart disease and diabetes mellitus as  well.   SOCIAL HISTORY:  The patient is divorced, has 3 children.  He does not drink  alcohol.  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  He smokes 1-1/2 packs of cigarettes per day for more than  20 years.   REVIEW OF SYSTEMS:  Significant positive and negatives as stated in HPI but  other review of systems  was unremarkable.   PHYSICAL EXAMINATION:  VITAL SIGNS:  BP was 140/95, temperature 97.8 degrees  Fahrenheit, heart rate of 98, respiratory rate of 20, saturation of 96% on  room air.  GENERAL:  During his admission, he was not acutely toxic-looking.  He was in  no acute cardiopulmonary distress.  HEENT:  Normocephalic, atraumatic.  Pupils were equal, round and reactive to  light.  Extraocular movements were intact.  His oropharynx was dry.  He did  not have any exudation or tumor of his pharynx.  NECK:  Neck was supple with no JVD.  LUNGS:  The lungs were clear to auscultation.  CARDIAC:  Exam was notable for mildly tachycardic without any gallops or  murmur.  ABDOMEN:  Abdomen was obese.  It was soft and nontender.  Bowel sounds were  present and normoactive.  EXTREMITIES:  He did not have any edema.  Dorsalis pedis pulses were  present.  They were adequate.  NEUROLOGIC:  The patient was alert and oriented x3 with no acute focal  deficits.   LABORATORY WORK:  His pH is 7.349, PCO2 of 51.9, bicarbonate of 28.6.  He  has a WBC of 10.4, hemoglobin 18.7, __________ , hematocrit is 63.0, MCV of   78.7, platelet count of 224,000.  Sodium was 119, potassium 6.6, chloride  94, glucose more than 700, CO2 26, BUN 51, creatinine 1.7.  Calcium 10.6.  Acetone negative; specimen was said to be hemolyzed.   IMPRESSION:  1. Hyperosmolar hyperglycemic state.  There is no evidence of diabetic     ketoacidosis.  2. Dehydration with prerenal azotemia.  3. Hyponatremia, likely pseudo-hyponatremia secondary to hyperglycemia.  4. Polycythemia likely secondary to dehydration.  5. Hyperkalemia probably related to hemolyzed specimen.   PLAN OF CARE:  The patient will be admitted to a telemetry bed.  We will  continue Glucomander protocol that has been initiated in the ED, however, we  will switch from DKA protocol to hyperglycemic protocol.  We will monitor  his electrolytes carefully and hydrate him adequately.  We will check his  hemoglobin A1c, his UA for urine protein.  The patient does not have a  primary care physician and we will take the opportunity to obtain a lipid  panel.  We will also obtain a 12-lead EKG for completeness-sake.  The  patient surely has diabetes mellitus and therefore he is newly diagnosed and  he will need diabetes nurse educator to see the patient and also possibly  outpatient followup with diabetes center on discharge.  The patient states  that he was supposed to have seen [name] tomorrow, so Dr. Eliberto Ivory  office will be notified so that if he desires, he may see the patient while  he is in the hospital.  On discharge, he will have an appointment for  dilated eye exam by ophthalmology as well as other routine diabetic care  including podiatrist care.                                                Jackie Plum, M.D.    GO/MEDQ  D:  12/04/2003  T:  12/04/2003  Job:  [id number]   cc:   Vanita Panda. Magnus Ivan, M.D.  Fax: [phone]

## 2010-09-19 NOTE — Assessment & Plan Note (Signed)
Wyatt Sandoval returns to the clinic today for follow up evaluation. He  reports that he is doing reasonably well over all. He continues to get  benefit from the methadone and Oxycodone as noted below. He does need  refills on both of those in the office today. He still has increased  pain in his back after he is an awkward position for any prolonged  period of time, especially while seated. He reports that his blood  sugars have been generally less than 126.   MEDICATIONS:  1. Trazodone 50 mg three tablets p.o. q.h.s. p.r.n.  2. Methadone 10 mg two tablets b.i.d.  3. Oxycodone 5 mg 1-2 tablets p.o. t.i.d. p.r.n. (approximately 6 per      day).  4. Lipitor 10 mg q.s.  5. Metformin 500 mg b.i.d.   REVIEW OF SYSTEMS:  Noncontributory.   PHYSICAL EXAMINATION:  A well-appearing, well-nourished adult male in  mild to no acute discomfort. Blood pressure 127/58 with a pulse of 97,  respiratory rate 16 and O2 saturation 99% on room air. He has 5/5  strength throughout the bilateral upper and lower extremities. Reflexes  were 2+ and symmetrical. Lumbar range of motion was decreased especially  in flexion.   IMPRESSION:  1. Lumbar degenerative disc disease with probable S1 nerve root      irritation causing left lower extremity pain.  2. Cervical degenerative disc disease with history of cervical pain.   In the office today we did refill the patient's methadone along with his  Oxycodone as of May 29, 2006. We will plan on seeing the patient in  follow up in approximately two months time with refills prior to that  appointment as necessary.           ______________________________  Ellwood Dense, M.D.     DC/MedQ  D:  05/27/2006 09:28:36  T:  05/27/2006 09:50:14  Job #:  [id number]

## 2010-09-19 NOTE — Assessment & Plan Note (Signed)
SUBJECTIVE:  [name] returns to the clinic today for followup  evaluation. He reports that he does have increased numbness and tingling  of his left leg, especially in the calf and heel region when he leans  towards the left side while seated. This appears to put more pressure on  the S1 nerve root where he has had degenerative changes present. He  reports that his blood sugars have been in the 150 to 180 range. He had  been seen by family physicians at [facility], but had not  been back to see them yet. The patient does need a refill on his  methadone and oxycodone over the next few days.   MEDICATIONS:  1. Trazodone 50 mg 3 tablets p.o. nightly p.r.n.  2. Methadone 10 mg 2 tablets b.i.d.  3. Oxycodone 5 mg 1 to 2 tablets p.o. t.i.d. p.r.n. (approximately 6      per day)  4. Lipitor 10 mg nightly  5. Metformin 500 mg b.i.d.   REVIEW OF SYSTEMS:  Noncontributory.   PHYSICAL EXAMINATION:  GENERAL:  Well-appearing, well-nourished adult  male in mild to no acute discomfort.  VITAL SIGNS:  Blood pressure 145/55 with a pulse of 91, respiratory rate  16 and O2 saturation 99% on room air.  EXTREMITIES:  He had 5/5 strength throughout. Bulk and tone were normal.   IMPRESSION:  1. Lumbar degenerative disc disease with probable S1 nerve root      irritation causing left lower extremity pain  2. Cervical degenerative disc disease with history of cervical pain   PLAN:  In the office today, we did refill the patient's methadone and  oxycodone each as of March 25th, 2008. We will plan on seeing the  patient in followup in this office in approximately 2 to 3 months' time  with refills prior to that appointment as necessary.           ______________________________  Wyatt Sandoval, M.D.     DC/MedQ  D:  07/22/2006 10:27:03  T:  07/22/2006 11:47:20  Job #:  [id number]
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
## 2010-09-19 NOTE — Assessment & Plan Note (Signed)
MEDICAL RECORD NUMBER:  [id number].   Mr. Glance returns to clinic today for followup evaluation. I last saw the  patient in this office January 30, 2005 for evaluation and treatment of  chronic neck and low back pain. We had decided to try him on Lyrica at 75 mg  daily. He reports that he did not get any relief with that medication. We  also did switch him from hydrocodone to oxycodone, and he was allowed to  take 5 mg 1 to 2 tablets p.o. t.i.d. p.r.n. He reports that he is getting a  fair amount of relief from that medicine and would like to continue that. He  also uses the trazodone at 150 mg nightly, and I have told him that I cannot  recommend using a chronic sleeping medication as it will build up dependency  on his part. We have decided to increase his trazodone medication. We have  also decided to discontinue the Lyrica since he was not getting any benefit,  and he was not really anxious to go up on the dose.   MEDICATIONS:  1.  Trazodone 50 mg 3 tablets p.o. nightly.  2.  Oxycodone 5 mg 1 to 2 tablets p.o. t.i.d. p.r.n. maximum 6 per day.   REVIEW OF SYSTEMS:  Positive for high blood sugar.   PHYSICAL EXAMINATION:  Well appearing fit adult male in no acute discomfort.  Blood pressure 130/64 with a pulse of 100, respiratory rate 16 and O2  saturation 100% on room air. He has 5/5 strength throughout the bilateral  upper and lower extremities. Bulk and tone were normal. Reflexes were 2+ and  symmetrical. Sensation was intact to light touch throughout the bilateral  upper and lower extremities. He ambulates without any assistive device.   IMPRESSION:  1.  Lumbar degenerative disk disease with probable S1 nerve root irrigation      causing lower extremity pain.  2.  Cervical degenerative disk disease with history of cervical surgery x2      with persistent pain.   In the office today, we did refill the patient's oxycodone at 5 mg as  written above. We also increased his  trazodone to 4 tablets p.o. nightly for  a total 200 mg as needed. We have asked him to discontinue his Lyrica, asked  him to follow up in two months' time. We will refill his medications as  necessary one month from now.           ______________________________  Ellwood Dense, M.D.     DC/MedQ  D:  03/02/2005 13:58:30  T:  03/02/2005 14:21:27  Job #:  [id number]

## 2010-09-19 NOTE — Assessment & Plan Note (Signed)
HISTORY OF PRESENT ILLNESS:  Mr. Ruffini returned to the clinic today for  follow up evaluation.  I last saw the patient in this office June 12, 2005.  At that time, we had tried to switch his pain medications, especially  the long term medication that he had been on.  When we first saw him in July  2006, we had him taking hydrocodone 7.5/650 one tablet t.i.d..  That has  been progressed to Oxycodone, but when I saw him in February 2007, he was  taking as many as 10-12 Oxycodone per day.  At that time, we started him on  OxyContin Extended Release 20 mg q.12 h.  He had someone to call us back  saying that his insurance would not pay for the OxyContin but they would pay  for morphine.  At that time, then, we started him on morphine sulfate  extended release 15 mg q.12 h.  He is coming into the office today saying  that he is not liking the side effects, especially the sexual dysfunction  that he feels is related to the morphine.  He would like to be tried on a  different long-term medication.  He continues to take the oxycodone  immediate release, up to approximately six tablets per day.   MEDICATIONS:  1.  Trazodone 50 mg three tablets p.o. q.h.s.  2.  Oxycodone 5 mg 1-2 tablets p.o. t.i.d. p.r.n.  3.  Morphine sulfate extended release 15 mg q.12 h.   REVIEW OF SYSTEMS:  Noncontributory.   PHYSICAL EXAMINATION:  GENERAL APPEARANCE:  Well-appearing, fit, adult male  in mild to no acute discomfort.  VITAL SIGNS:  Blood pressure 113/63, pulse 97, respirations 16, O2  saturation 98% on room air.  He has decreased lumbar range of motion in all  planes.  Upper and lower extremity strength was generally 5-/5 with normal  bulk and tone.   IMPRESSION:  1.  Lumbar degenerative disk disease with probable S1 nerve root irritation      causing lower extremity pain.  2.  Cervical degenerative disk disease with history of cervical pain.   PLAN:  In the office today, we did refill the  patient's oxycodone immediate  release at 5 mg 1-2 tablet p.o. t.i.d. p.r.n.  In place of his morphine  sulfate, which was in place of his OxyContin, we decided to start him on  methadone 10 mg two tablet p.o. b.i.d.  We will see how that does as his  long term pain relieving medicine.  Hopefully, we can get him down to maybe  using only that and not requiring any extra Oxycodone.  He has been told to  try to keep the Oxycodone down to maximum of six per day only on an as  needed basis.  We will plan on seeing him in follow up in approximately two  months time.  We did discard the morphine sulfate extended release that he  brings into the office today stating he does not like the side effects as  noted above.           ______________________________  Ellwood Dense, M.D.     DC/MedQ  D:  08/14/2005 12:31:12  T:  08/14/2005 14:18:03  Job #:  [id number]

## 2010-09-19 NOTE — Assessment & Plan Note (Signed)
MEDICAL RECORD NUMBER:  [id number].   Mr. Nall returns to clinic today for followup evaluation. He is a 50-year-  old adult male who I first and last saw in this office November 28, 2004 for  evaluation and treatment of chronic neck and low back pain. At the initial  visit, we had decided not to give him any pain medicines as he was reporting  abnormal liver function. We did obtain liver function tests on that day,  and they came back within normal limits apart from a SGPT of 60. It was also  positive for opiates on a urine drug screen but was taking hydrocodone at  that time.   The patient reports that he still has numbness of the posterior portion of  his left leg in the calf and foot region. Has a MRI scan dated from July  2005 which showed L5-S1 posterior lateral disk protrusion on the left,  displacing the left SI nerve root. There were also some degenerative changes  with disk protrusion at L3-4 and L4-5. He also has a history of cervical  fusion done twice in the past, and [name] has seen him for that problem.  He mostly complains of pain of his left leg at this time. The patient is on  disability.   MEDICATIONS:  Trazodone 50 mg 3 tablets p.o. nightly.   REVIEW OF SYSTEMS:  Noncontributory.   PHYSICAL EXAMINATION:  Well-appearing, adult male in mild acute discomfort.  Blood pressure 122/66 with a pulse of 96, respiratory rate 16, and O2  saturation 100% on room air. He has 5-/5 strength throughout the bilateral  upper and lower extremities. Bulk and tone were normal, and reflexes were 2+  and symmetrical. Sensation was intact to light touch throughout the upper  and lower extremities. The patient ambulates without any assistive device.   IMPRESSION:  1.  Lumbar degenerative disk disease with probably S1 nerve root irritation      causing lower extremity pain.  2.  Cervical degenerative disk disease with history of cervical surgery x2      with persistent pain.   In the  office today, we did start the patient on Lyrica 75 mg daily. We have  given him samples in the office today and asked him to try that over the  next month. We have decided to not use the hydrocodone secondary to the  Tylenol that is present combined with the hydrocodone. In place of that, we  have stared him on oxycodone 5 mg to be used 1 to 2 tablets p.o. t.i.d.  p.r.n. I have asked him to use it only as absolutely necessary and up to a  maximum of six tablets per day. I have encouraged him to use only what is  absolutely needed for control of his pain. We have also give him a refill on  his trazodone 50 mg 3 tablets p.o. nightly. I have asked him to be as active  as possible and to get out and do at least walking on a daily basis. We will  plan on seeing him in followup in approximately one month's time to see how  he does on a combination of the oxycodone and Lyrica.           ______________________________  Ellwood Dense, M.D.     DC/MedQ  D:  01/30/2005 10:05:44  T:  01/30/2005 11:12:31  Job #:  [id number]

## 2010-09-19 NOTE — Assessment & Plan Note (Signed)
HISTORY:  Mr. Hoogendoorn returns today for followup evaluation.  Overall, he is  doing well.  When we last saw him in this office in April 2007, we switched  him to methadone which was in place of morphine sulfate which was in place  of OxyContin.  On the methadone, he used 10 mg two tablets twice a day and  reports that he is getting good relief.  He also uses oxycodone 5 mg one to  two tablets p.o. t.i.d. for break-through pain.  He needs a refill on both  of those in the office today.  He reports that he is getting good analgesic  effect.  He is not having any adverse side effects and tolerating the  medicine well.   MEDICATIONS:  1.  Trazodone 50 mg three tablets p.o. q.h.s.  2.  Oxycodone 5 mg one to two tablets p.o. t.i.d. p.r.n. (approximately 2 to      4 per day).  3.  Methadone 10 mg two tablets p.o. b.i.d.  4.  Lipitor 10 mg daily.  5.  Metformin 500 mg b.i.d.   REVIEW OF SYSTEMS:  Positive for high blood sugar.   PHYSICAL EXAMINATION:  Well-appearing, fit adult male in mild to no acute  discomfort.  Blood pressure 146/80, pulse 89, respiratory rate 16, O2  saturation 96% on room air.  He has 5/5 strength in bilateral upper  extremities and lower extremities.  Bulk and tone normal.   IMPRESSION:  1.  Lumbar degenerative disk disease with probable S1 nerve root irritation      causing lower extremity pain.  2.  Cervical degenerative disk disease with history of cervical pain.   In the office today, we did refill the patient's OxyContin and methadone as  of today.  Will plan on seeing him in followup in approximately two months'  time with refills prior to that appointment as necessary at one month's  time.           ______________________________  Ellwood Dense, M.D.     DC/MedQ  D:  11/09/2005 13:26:44  T:  11/09/2005 15:29:51  Job #:  [id number]

## 2010-09-30 ENCOUNTER — Telehealth: Payer: Self-pay | Admitting: Family Medicine

## 2010-09-30 NOTE — Telephone Encounter (Signed)
Wants to know if other medical records has come in b/c he is wanting to be referred to pain clinic.

## 2010-10-01 NOTE — Telephone Encounter (Signed)
LVM for patient to call back. ?

## 2010-10-02 NOTE — Telephone Encounter (Signed)
Spoke with patient and informed him that I re-[facility] He has agreed with me that this is fine and that if we don't hear anything or if he doesn't by Monday/Tuesday of next week a follow up phone call with center will be made

## 2010-10-02 NOTE — Telephone Encounter (Signed)
Spoke with patient and he was asking about his pain clinic referral. Informed him that referral was sent in 5/17. Haven't heard anything so I will follow up with pain clinic and give him an update

## 2010-10-02 NOTE — Telephone Encounter (Signed)
Faxed out referral again

## 2010-10-07 NOTE — Telephone Encounter (Signed)
Pt called back to see if we have rec'd records.  Was told to call back this week.

## 2010-10-08 NOTE — Telephone Encounter (Signed)
Going to [facility] due to a [facility] call from [facility] stating that patient was seen there and they don't have anything to offer patient

## 2010-10-14 NOTE — Telephone Encounter (Signed)
Wants to know if an appt has been for pain clinic.

## 2010-10-15 ENCOUNTER — Telehealth: Payer: Self-pay | Admitting: Family Medicine

## 2010-10-15 NOTE — Telephone Encounter (Signed)
[facility] faxed over refill request [phone], has not gotten a response and patient is out of all of his meds.  And needs to know the status of referral to pain clinic.

## 2010-10-16 NOTE — Telephone Encounter (Signed)
Called and lvm to inform pt that the referral for pain clinic has been sent and they will call him with the time and date of his appt. Looked for the refill request and could not find it asked pt to call pharmacy to have them to fax request again.Marland KitchenMarland KitchenLoralee Pacas [place]

## 2010-10-17 ENCOUNTER — Other Ambulatory Visit: Payer: Self-pay | Admitting: *Deleted

## 2010-10-17 DIAGNOSIS — E785 Hyperlipidemia, unspecified: Secondary | ICD-10-CM

## 2010-10-17 DIAGNOSIS — M549 Dorsalgia, unspecified: Secondary | ICD-10-CM

## 2010-10-17 MED ORDER — SIMVASTATIN 40 MG PO TABS: 40.0000 mg | ORAL_TABLET | Freq: Every day | ORAL | Status: DC

## 2010-10-17 MED ORDER — METFORMIN HCL 500 MG PO TABS: 500.0000 mg | ORAL_TABLET | Freq: Two times a day (BID) | ORAL | Status: DC

## 2010-10-17 MED ORDER — AMITRIPTYLINE HCL 75 MG PO TABS: 75.0000 mg | ORAL_TABLET | Freq: Every day | ORAL | Status: DC

## 2010-10-17 NOTE — Assessment & Plan Note (Signed)
Received fax request for 40 mg simvastatin refill - yet our records indicated he was on 80 mg dose.  Given recent info that 80 mg dose has more myositis, will go with 40 mg dose.

## 2010-10-17 NOTE — Assessment & Plan Note (Signed)
Refilled neuropathic pain med via fax request

## 2010-10-17 NOTE — Telephone Encounter (Signed)
Dr. Leveda Anna gave 6 refills on metformin rx

## 2010-12-05 ENCOUNTER — Telehealth: Payer: Self-pay | Admitting: Family Medicine

## 2010-12-05 NOTE — Telephone Encounter (Signed)
Pt has never heard anything about pain clinic referral - pls advise

## 2010-12-08 ENCOUNTER — Telehealth: Payer: Self-pay | Admitting: Family Medicine

## 2010-12-08 NOTE — Telephone Encounter (Signed)
Pt moved to [place] from [place] and pharmacy here needs new scripts in order to fill - needs it called to [facility] Metformin Zocor Elavil  Also hasn't heard about pain clinic yet.

## 2010-12-09 NOTE — Telephone Encounter (Signed)
Pt is out of these meds and needs asap

## 2010-12-10 ENCOUNTER — Other Ambulatory Visit: Payer: Self-pay | Admitting: Family Medicine

## 2010-12-10 DIAGNOSIS — G8929 Other chronic pain: Secondary | ICD-10-CM

## 2010-12-10 DIAGNOSIS — E785 Hyperlipidemia, unspecified: Secondary | ICD-10-CM

## 2010-12-10 MED ORDER — METFORMIN HCL 500 MG PO TABS: 500.0000 mg | ORAL_TABLET | Freq: Two times a day (BID) | ORAL | Status: DC

## 2010-12-10 MED ORDER — SIMVASTATIN 40 MG PO TABS: 40.0000 mg | ORAL_TABLET | Freq: Every day | ORAL | Status: DC

## 2010-12-10 MED ORDER — AMITRIPTYLINE HCL 75 MG PO TABS: 75.0000 mg | ORAL_TABLET | Freq: Every day | ORAL | Status: DC

## 2010-12-10 NOTE — Telephone Encounter (Signed)
Refilled meds.  Will flag and ask [facility] if they have heard pain clinic alternatives.  [facility] states they have nothing further to offer this patient.

## 2010-12-15 NOTE — Telephone Encounter (Signed)
Pt is calling again about appt with another pain clinic

## 2010-12-18 ENCOUNTER — Telehealth: Payer: Self-pay | Admitting: *Deleted

## 2010-12-18 NOTE — Telephone Encounter (Signed)
Faxing referral to [facility] in [place]

## 2010-12-23 ENCOUNTER — Ambulatory Visit (INDEPENDENT_AMBULATORY_CARE_PROVIDER_SITE_OTHER): Payer: Medicare Other | Admitting: Family Medicine

## 2010-12-23 DIAGNOSIS — I499 Cardiac arrhythmia, unspecified: Secondary | ICD-10-CM

## 2010-12-23 DIAGNOSIS — R042 Hemoptysis: Secondary | ICD-10-CM

## 2010-12-23 DIAGNOSIS — Z87891 Personal history of nicotine dependence: Secondary | ICD-10-CM

## 2010-12-23 DIAGNOSIS — Z111 Encounter for screening for respiratory tuberculosis: Secondary | ICD-10-CM

## 2010-12-23 DIAGNOSIS — F17201 Nicotine dependence, unspecified, in remission: Secondary | ICD-10-CM

## 2010-12-23 MED ORDER — ASPIRIN 81 MG PO TABS: 81.0000 mg | ORAL_TABLET | Freq: Every day | ORAL | Status: DC

## 2010-12-23 MED ORDER — MIRTAZAPINE 30 MG PO TBDP: 30.0000 mg | ORAL_TABLET | Freq: Every day | ORAL | Status: AC

## 2010-12-23 NOTE — Progress Notes (Addendum)
Addended by: Edd Arbour on: 12/23/2010 01:59 PM  Modules accepted: Level of Service I have reviewed this chart and agree with assessment and plan as stated.

## 2010-12-23 NOTE — Progress Notes (Signed)
  Subjective:    Patient ID: Wyatt Sandoval, male    DOB: 1961-03-15, 50 y.o.   MRN: [id number]  HPI 1. Hemoptysis Patient quit smoking in June. He experienced hemoptysis shortly after. This went away but returned a couple weeks ago. He says he wakes up in the morning and spits up blood clots. He does not experience this throughout the day and does not have a persistent cough. He has no fevers, chills, or night sweats. He has no weight loss. He has no difficulty breathing or swallowing. He is experiencing some left sided chest sensation, like a pinch. He has no left arm or left jaw involvement. He c/o fatigue and headaches. He has a family history of lung CA and CA in the family.  2. Insomnia Currently taking Amitriptyline for his difficulty falling asleep.   3. Hx of Irregular heart rate Was apparently followed by a cardiologist who recommended a stress test. He doesn't have a specific diagnosis. His rate and rhythm were normal today on PE.  No syncope, no palpitations, no drug abuse.  4. Tobacco abuse in remission Quit in June. Experiencing some hemoptysis.   Review of Systems  Constitutional: Positive for fatigue. Negative for fever, chills, diaphoresis, activity change, appetite change and unexpected weight change.  HENT: Negative for nosebleeds, congestion, sore throat, rhinorrhea, mouth sores, trouble swallowing, neck pain, voice change and sinus pressure.   Eyes: Negative for visual disturbance.  Respiratory: Positive for cough. Negative for apnea, chest tightness, shortness of breath and wheezing.   Cardiovascular: Positive for chest pain. Negative for palpitations and leg swelling.  Gastrointestinal: Negative for nausea, vomiting, abdominal pain, diarrhea, constipation and blood in stool.  Genitourinary: Negative for urgency, hematuria, flank pain and difficulty urinating.  Musculoskeletal: Positive for back pain. Negative for myalgias, joint swelling and arthralgias.  Skin:  Negative for rash.  Neurological: Positive for headaches.  Hematological: Negative for adenopathy. Does not bruise/bleed easily.  Psychiatric/Behavioral: Positive for sleep disturbance.       Objective:   Physical Exam  Constitutional: He appears well-developed and well-nourished. No distress.  HENT:  Head: Normocephalic and atraumatic.  Nose: Nose normal.  Mouth/Throat: Oropharynx is clear and moist. No oropharyngeal exudate.  Cardiovascular: Normal rate and regular rhythm.   No murmur heard. Pulmonary/Chest: Effort normal and breath sounds normal. No respiratory distress. He has no wheezes. He has no rales. He exhibits no tenderness.  Skin: No rash noted. He is not diaphoretic.      Assessment & Plan:  1. 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 His respiratory tract is coming back to life and expectorating all the debris. Cannot rule out Lung CA or TB. - Chest Xray - TB skin test - close follow up - pt. Advised to call clinic if symptoms persist past a couple weeks.  2. Insomnia Currently taking Amitriptyline for his difficulty falling asleep.  - adding Remeron  3. Hx of Irregular heart rate - referral to Cardiology - he was seeing one before he move here. - no indication for a stress test at this time  4. Tobacco abuse in remission Continue cessation

## 2010-12-23 NOTE — Patient Instructions (Signed)
It was great seeing you today.  Congratulations on quitting smoking, that's a huge step toward a healthy life.  Make sure you get the chest xray. I will call you with the results.  I prescribed you remeron, which is a sleeping pill.  Please call the office if you continue to spit up blood over the next few weeks.

## 2010-12-24 ENCOUNTER — Encounter: Payer: Self-pay | Admitting: Family Medicine

## 2010-12-24 ENCOUNTER — Ambulatory Visit (HOSPITAL_COMMUNITY)
Admission: RE | Admit: 2010-12-24 | Discharge: 2010-12-24 | Disposition: A | Discharge status: Home / Self Care | Payer: Medicare Other | Source: Ambulatory Visit | Attending: Family Medicine | Admitting: Family Medicine

## 2010-12-24 DIAGNOSIS — R042 Hemoptysis: Secondary | ICD-10-CM | POA: Insufficient documentation

## 2010-12-25 ENCOUNTER — Telehealth: Payer: Self-pay | Admitting: Family Medicine

## 2010-12-25 ENCOUNTER — Ambulatory Visit (INDEPENDENT_AMBULATORY_CARE_PROVIDER_SITE_OTHER): Payer: Medicare Other | Admitting: *Deleted

## 2010-12-25 DIAGNOSIS — IMO0001 Reserved for inherently not codable concepts without codable children: Secondary | ICD-10-CM

## 2010-12-25 DIAGNOSIS — Z111 Encounter for screening for respiratory tuberculosis: Secondary | ICD-10-CM

## 2010-12-25 LAB — TB SKIN TEST: TB Skin Test: NEGATIVE mm

## 2010-12-25 NOTE — Telephone Encounter (Signed)
Told patient about his TB and chest xray results. He is still having small morning hemoptysis. I asked him to schedule an appointment to see me in 3 weeks.

## 2010-12-25 NOTE — Progress Notes (Signed)
PPD negative-0 mm. 

## 2011-01-01 ENCOUNTER — Telehealth: Payer: Self-pay | Admitting: Family Medicine

## 2011-01-01 NOTE — Telephone Encounter (Addendum)
Pt agreable to attend pain clinic in [place].  Also need referral to neurologist

## 2011-01-02 NOTE — Telephone Encounter (Signed)
Faxed ROI to Dr. Jonah Blue office for release of pain records

## 2011-01-07 NOTE — Telephone Encounter (Signed)
I do not know why he needs a Neurology referral.  He will need an OV first.  Thanks

## 2011-01-07 NOTE — Telephone Encounter (Signed)
Wyatt Sandoval, Does this patient need to get a referral for a neurologist? ----[place] Dec

## 2011-01-08 NOTE — Telephone Encounter (Signed)
LVM for patient to call back to inform of below 

## 2011-02-17 ENCOUNTER — Ambulatory Visit (INDEPENDENT_AMBULATORY_CARE_PROVIDER_SITE_OTHER): Payer: Medicare Other | Admitting: Family Medicine

## 2011-02-17 ENCOUNTER — Encounter: Payer: Self-pay | Admitting: Family Medicine

## 2011-02-17 VITALS — BP 132/87 | HR 94 | Temp 97.3°F | Ht 67.0 in | Wt 215.0 lb

## 2011-02-17 DIAGNOSIS — Z23 Encounter for immunization: Secondary | ICD-10-CM

## 2011-02-17 DIAGNOSIS — R042 Hemoptysis: Secondary | ICD-10-CM

## 2011-02-17 DIAGNOSIS — G5600 Carpal tunnel syndrome, unspecified upper limb: Secondary | ICD-10-CM

## 2011-02-17 DIAGNOSIS — E119 Type 2 diabetes mellitus without complications: Secondary | ICD-10-CM

## 2011-02-17 DIAGNOSIS — G5603 Carpal tunnel syndrome, bilateral upper limbs: Secondary | ICD-10-CM

## 2011-02-17 DIAGNOSIS — F17200 Nicotine dependence, unspecified, uncomplicated: Secondary | ICD-10-CM

## 2011-02-17 HISTORY — DX: Carpal tunnel syndrome, bilateral upper limbs: G56.03

## 2011-02-17 LAB — POCT GLYCOSYLATED HEMOGLOBIN (HGB A1C): Hemoglobin A1C: 6.7

## 2011-02-17 MED ORDER — GLUCOSE BLOOD VI STRP: ORAL_STRIP | Status: DC

## 2011-02-17 NOTE — Progress Notes (Signed)
  Subjective:    Patient ID: Wyatt Sandoval, male    DOB: 12-May-1960, 50 y.o.   MRN: [id number]  HPI 1.   Diabetes:  Currently on Metformin.  Last A1C was 7.2.  No adverse effects from medication.  No hypoglycemic events.  No paresthesia or peripheral nerve pain.  Measures blood sugars at home every:  Day.  Of note, he was using a new blood glucose monitor he received on Friday. He states his blood glucose would very between 110 and 170, often only 1-2 minutes apart and checked from the same finger. He did bring his blood glucose monitor to the office today. He had several readings ranging from 105 to 130s. These are the same finger only several seconds apart.  Patient desires ophthalmology followup. Lab Results  Component Value Date   HGBA1C 6.7 02/17/2011   2.  Concern for carpal tunnel syndrome: Patient complains of bilateral hand pain mostly in the first 3 digits. This has been present for the past several years. He states no other physicians really helpful this before in the past. Pain awakens him from sleep at night. Also often has pain first thing the morning. Also reports pain with prolonged driving. Reports numbness and tingling that occurs in all 5 fingers but is worse in the first 3. Unable to tell me which hand hurts worse as he states that the pain is about equal in both hands. The oxycodone he gets from the pain clinic does help with this but again awakens him from sleep at night. No fevers or chills  3.  hemoptysis: Several episodes occurred when he first quit smoking several months ago. Did start smoking about 3 weeks ago the last 4 weeks then quit again. The mom says is totally resolved. His chest x-ray is negative. No fevers chills night sweats. No weight gain or loss.   Review of Systems See HPI above for review of systems.       Objective:   Physical Exam Gen:  Alert, cooperative patient who appears stated age in no acute distress.  Vital signs reviewed. Pulm:  Clear to  auscultation bilaterally with good air movement.  No wheezes or rales noted.   Cardiac:  Regular rate and rhythm without murmur auscultated.  Good S1/S2. MSK:  Phalen's test positive. No paresthesias or numbness currently. No tenderness to palpation bilateral hands currently. Strength 4/5 bilateral hands, limited by pain. Strength 5 out of 5 bilateral upper and lower extremities otherwise except handgrip. Neuro:  No decreased sensation bilateral hands.       Assessment & Plan:

## 2011-02-17 NOTE — Assessment & Plan Note (Signed)
A1C 6.7.   Encouraged patient to continue good control and diet control. Continue Metformin for now.  FU in 3 months

## 2011-02-17 NOTE — Assessment & Plan Note (Signed)
Re-started cigarettes x 1 week, has stopped again about 3 weeks ago

## 2011-02-17 NOTE — Assessment & Plan Note (Signed)
Bilateral. Discussed treatment options, including conservative, splinting, steroid injection, surgery. Patient desires steroid injection.  Admits compliance may be an issue with wearing splint everyday.  [facility]er to [facility] today for ultrasound confirmation of carpal tunnel and possible treatment with steroid injection.

## 2011-02-17 NOTE — Patient Instructions (Signed)
Make an appt on your way out to be seen at [facility] when it is best for you. I will send in the referral to the dentist and the eye doctor. I will also send in test strips for your glucose meter. Nice work on you A1C!

## 2011-02-17 NOTE — Assessment & Plan Note (Signed)
CXR negative. Resolved

## 2011-02-18 ENCOUNTER — Telehealth: Payer: Self-pay | Admitting: *Deleted

## 2011-02-18 NOTE — Telephone Encounter (Signed)
Pt asked that I leave appt information on his vm as follows:  appt made for 10.25.2012 @ 815 am with Dr. Fawn Kirk  [facility] [address] [place], [place] [address] Informed pt that if he cannot keep this appt to call their office at [phone] to cancel/reschedule.Loralee Pacas [place]

## 2011-02-19 ENCOUNTER — Ambulatory Visit (INDEPENDENT_AMBULATORY_CARE_PROVIDER_SITE_OTHER): Payer: Medicare Other | Admitting: Family Medicine

## 2011-02-19 ENCOUNTER — Encounter: Payer: Self-pay | Admitting: Family Medicine

## 2011-02-19 VITALS — BP 134/85 | HR 89 | Ht 67.0 in | Wt 215.0 lb

## 2011-02-19 DIAGNOSIS — G5601 Carpal tunnel syndrome, right upper limb: Secondary | ICD-10-CM

## 2011-02-19 DIAGNOSIS — G5600 Carpal tunnel syndrome, unspecified upper limb: Secondary | ICD-10-CM

## 2011-02-19 DIAGNOSIS — G5602 Carpal tunnel syndrome, left upper limb: Secondary | ICD-10-CM

## 2011-02-19 NOTE — Progress Notes (Signed)
Subjective:    Patient ID: Wyatt Sandoval, male    DOB: July 03, 1960, 50 y.o.   MRN: [id number]  HPI  Wyatt Sandoval is a pleasant 50 yo male patient complaining of pain, numbness and tingling in both of his hand for the last 11 years. The right hand is worse than the left. He was diagnosed in 2001 with B/L carpal tunnel syndrome and had NCS /EMG which reported B/L carpal tunnel. He wakes up in the middle of the night with his both hand numb worse in the right side. He states that he may be a weaker grip compare with before.   Patient Active Problem List  Diagnoses  . DIABETES MELLITUS II, UNCOMPLICATED  . HYPERLIPIDEMIA  . TOBACCO DEPENDENCE  . IMPETIGO, BULLOUS  . LICHEN SIMPLEX CHRONICUS  . Chronic back pain  . Irregular heart rhythm  . Carpal tunnel syndrome, bilateral    Current Outpatient Prescriptions on File Prior to Visit  Medication Sig Dispense Refill  . amitriptyline (ELAVIL) 75 MG tablet Take 1 tablet (75 mg total) by mouth at bedtime.  30 tablet  3  . aspirin 81 MG tablet Take 1 tablet (81 mg total) by mouth daily.  30 tablet  11  . Blood Glucose Monitoring Suppl (ONE TOUCH ULTRA 2) W/DEVICE KIT by Does not apply route.        . enalapril (VASOTEC) 2.5 MG tablet Take 2.5 mg by mouth daily.        . fish oil-omega-3 fatty acids 1000 MG capsule Take 2 g by mouth daily.        Marland Kitchen glucose blood (ONE TOUCH ULTRA TEST) test strip Use as instructed  35 each  3  . metFORMIN (GLUCOPHAGE) 500 MG tablet Take 1 tablet (500 mg total) by mouth 2 (two) times daily with a meal.  60 tablet  3  . oxyCODONE (OXYCONTIN) 15 MG TB12 Take 15 mg by mouth every 12 (twelve) hours.        . simvastatin (ZOCOR) 40 MG tablet Take 1 tablet (40 mg total) by mouth at bedtime.  30 tablet  3  . tiZANidine (ZANAFLEX) 4 MG tablet Take 4 mg by mouth every 6 (six) hours as needed.         No Known Allergies     Review of Systems  Constitutional: Negative for fever, chills, diaphoresis and fatigue.    Musculoskeletal: Negative for myalgias, back pain, joint swelling and gait problem.  Neurological: Positive for numbness.       Objective:   Physical Exam  Constitutional: He appears well-developed and well-nourished.       BP 134/85  Pulse 89  Ht 5\' 7"  (1.702 m)  Wt 215 lb (97.523 kg)  BMI 33.67 kg/m2   Pulmonary/Chest: Effort normal.  Musculoskeletal:       B/L wrist with FROM. Fingers with FROM. B/L positive tinel sing. B/L positive compression test B/L positive Phalen test. Sensation decrease in the tip of the right 1st,2nd and 3rd finger.  Neurological: He is alert.  Skin: Skin is warm. No rash noted. No erythema. No pallor.   MSK U/S    R wrist median nerve looks flatter, 0.5 diameter. L wrist median nerve look more round diameter 0.30 with and accessory bundle.       Assessment & Plan:   1. Carpal tunnel syndrome of left wrist   2. Carpal tunnel syndrome of right wrist   B/L wrist splints. Sleep with the wrist splints. Ref for EMG/NCS  with Dr. Maurice Small . Appointment 02/24/11 at 9 am. We will call you with study reports.

## 2011-02-19 NOTE — Patient Instructions (Signed)
You have been scheduled for an appointment for a nerve conduction study 02/24/11 at 9 am at [facility]  Their office is located at [address] [place], [place] [address], phone number is [phone]  The test will take about 1 hour.  Please wear short sleeves, and do not wear jewelry or lotion on your arms.

## 2011-03-03 ENCOUNTER — Ambulatory Visit: Payer: Medicare Other | Attending: Anesthesiology | Admitting: Rehabilitative and Restorative Service Providers"

## 2011-03-03 DIAGNOSIS — M2569 Stiffness of other specified joint, not elsewhere classified: Secondary | ICD-10-CM | POA: Insufficient documentation

## 2011-03-03 DIAGNOSIS — M545 Low back pain, unspecified: Secondary | ICD-10-CM | POA: Insufficient documentation

## 2011-03-03 DIAGNOSIS — M25569 Pain in unspecified knee: Secondary | ICD-10-CM | POA: Insufficient documentation

## 2011-03-03 DIAGNOSIS — M25559 Pain in unspecified hip: Secondary | ICD-10-CM | POA: Insufficient documentation

## 2011-03-03 DIAGNOSIS — IMO0001 Reserved for inherently not codable concepts without codable children: Secondary | ICD-10-CM | POA: Insufficient documentation

## 2011-03-04 ENCOUNTER — Ambulatory Visit: Payer: Medicare Other | Admitting: Physical Therapy

## 2011-03-11 ENCOUNTER — Encounter: Payer: Medicare Other | Admitting: Physical Therapy

## 2011-03-16 ENCOUNTER — Ambulatory Visit: Payer: Medicare Other | Attending: Anesthesiology | Admitting: Rehabilitative and Restorative Service Providers"

## 2011-03-16 DIAGNOSIS — M2569 Stiffness of other specified joint, not elsewhere classified: Secondary | ICD-10-CM | POA: Insufficient documentation

## 2011-03-16 DIAGNOSIS — M25559 Pain in unspecified hip: Secondary | ICD-10-CM | POA: Insufficient documentation

## 2011-03-16 DIAGNOSIS — M25569 Pain in unspecified knee: Secondary | ICD-10-CM | POA: Insufficient documentation

## 2011-03-16 DIAGNOSIS — M545 Low back pain, unspecified: Secondary | ICD-10-CM | POA: Insufficient documentation

## 2011-03-16 DIAGNOSIS — IMO0001 Reserved for inherently not codable concepts without codable children: Secondary | ICD-10-CM | POA: Insufficient documentation

## 2011-03-18 ENCOUNTER — Ambulatory Visit: Payer: Medicare Other | Admitting: Rehabilitative and Restorative Service Providers"

## 2011-03-30 ENCOUNTER — Ambulatory Visit: Payer: Medicare Other | Admitting: Physical Therapy

## 2011-04-01 ENCOUNTER — Ambulatory Visit: Payer: Medicare Other | Admitting: Rehabilitative and Restorative Service Providers"

## 2011-04-06 ENCOUNTER — Ambulatory Visit: Payer: Medicare Other | Attending: Anesthesiology | Admitting: Rehabilitative and Restorative Service Providers"

## 2011-04-06 DIAGNOSIS — M545 Low back pain, unspecified: Secondary | ICD-10-CM | POA: Insufficient documentation

## 2011-04-06 DIAGNOSIS — M25559 Pain in unspecified hip: Secondary | ICD-10-CM | POA: Insufficient documentation

## 2011-04-06 DIAGNOSIS — M2569 Stiffness of other specified joint, not elsewhere classified: Secondary | ICD-10-CM | POA: Insufficient documentation

## 2011-04-06 DIAGNOSIS — M25569 Pain in unspecified knee: Secondary | ICD-10-CM | POA: Insufficient documentation

## 2011-04-06 DIAGNOSIS — IMO0001 Reserved for inherently not codable concepts without codable children: Secondary | ICD-10-CM | POA: Insufficient documentation

## 2011-04-08 ENCOUNTER — Ambulatory Visit: Payer: Medicare Other | Admitting: Rehabilitative and Restorative Service Providers"

## 2011-04-13 ENCOUNTER — Ambulatory Visit: Payer: Medicare Other | Admitting: Physical Therapy

## 2011-04-15 ENCOUNTER — Ambulatory Visit: Payer: Medicare Other | Admitting: Rehabilitative and Restorative Service Providers"

## 2011-05-06 ENCOUNTER — Other Ambulatory Visit: Payer: Self-pay | Admitting: Family Medicine

## 2011-05-06 DIAGNOSIS — E785 Hyperlipidemia, unspecified: Secondary | ICD-10-CM

## 2011-05-06 NOTE — Telephone Encounter (Signed)
Refill request

## 2011-05-06 NOTE — Telephone Encounter (Signed)
Addended by: Kenova Cellar on: 05/06/2011 01:11 PM   Modules accepted: Orders

## 2011-06-03 ENCOUNTER — Other Ambulatory Visit: Payer: Self-pay | Admitting: Family Medicine

## 2011-06-03 MED ORDER — OXYCODONE HCL 15 MG PO TB12: 15.0000 mg | ORAL_TABLET | Freq: Two times a day (BID) | ORAL | Status: DC

## 2011-09-04 ENCOUNTER — Other Ambulatory Visit: Payer: Self-pay | Admitting: Family Medicine

## 2011-10-03 ENCOUNTER — Other Ambulatory Visit: Payer: Self-pay | Admitting: Family Medicine

## 2011-10-13 ENCOUNTER — Ambulatory Visit (INDEPENDENT_AMBULATORY_CARE_PROVIDER_SITE_OTHER): Payer: Medicare Other | Admitting: Family Medicine

## 2011-10-13 ENCOUNTER — Encounter: Payer: Self-pay | Admitting: Family Medicine

## 2011-10-13 VITALS — BP 170/90 | Temp 98.1°F | Ht 67.0 in | Wt 205.0 lb

## 2011-10-13 DIAGNOSIS — L0233 Carbuncle of buttock: Secondary | ICD-10-CM

## 2011-10-13 DIAGNOSIS — E119 Type 2 diabetes mellitus without complications: Secondary | ICD-10-CM

## 2011-10-13 DIAGNOSIS — E785 Hyperlipidemia, unspecified: Secondary | ICD-10-CM

## 2011-10-13 DIAGNOSIS — L0232 Furuncle of buttock: Secondary | ICD-10-CM

## 2011-10-13 DIAGNOSIS — F17200 Nicotine dependence, unspecified, uncomplicated: Secondary | ICD-10-CM

## 2011-10-13 DIAGNOSIS — J069 Acute upper respiratory infection, unspecified: Secondary | ICD-10-CM

## 2011-10-13 LAB — LIPID PANEL
LDL Cholesterol: 89 mg/dL (ref 0–99)
Total CHOL/HDL Ratio: 3.2 Ratio
VLDL: 22 mg/dL (ref 0–40)

## 2011-10-13 LAB — CBC
Hemoglobin: 15.7 g/dL (ref 13.0–17.0)
MCH: 26.9 pg (ref 26.0–34.0)
MCHC: 34 g/dL (ref 30.0–36.0)
MCV: 79.1 fL (ref 78.0–100.0)
RBC: 5.84 MIL/uL — ABNORMAL HIGH (ref 4.22–5.81)

## 2011-10-13 LAB — COMPREHENSIVE METABOLIC PANEL
AST: 70 U/L — ABNORMAL HIGH (ref 0–37)
Albumin: 4.2 g/dL (ref 3.5–5.2)
Alkaline Phosphatase: 80 U/L (ref 39–117)
BUN: 14 mg/dL (ref 6–23)
Glucose, Bld: 114 mg/dL — ABNORMAL HIGH (ref 70–99)
Potassium: 4.3 mEq/L (ref 3.5–5.3)
Sodium: 139 mEq/L (ref 135–145)
Total Bilirubin: 0.3 mg/dL (ref 0.3–1.2)

## 2011-10-13 MED ORDER — BUPROPION HCL ER (SR) 150 MG PO TB12: 150.0000 mg | ORAL_TABLET | Freq: Two times a day (BID) | ORAL | Status: DC

## 2011-10-13 MED ORDER — NICOTINE 21 MG/24HR TD PT24: 1.0000 | MEDICATED_PATCH | TRANSDERMAL | Status: AC

## 2011-10-13 MED ORDER — DOXYCYCLINE HYCLATE 100 MG PO TABS: 100.0000 mg | ORAL_TABLET | Freq: Two times a day (BID) | ORAL | Status: AC

## 2011-10-13 NOTE — Patient Instructions (Signed)
Quit-Line 24/7 stop smoking Line  1-800-QUIT-NOW (719)731-8360 [facility] They should be able to get you patches. The other medication I have sent in is called Zyban.  Use this once in the AM and once in the PM.    I have sent in an antibiotic for you. It is called Doxycycline.  Take this twice a day for the next 2 weeks.   If the boil is not improving, come back to see me.  Keep taking the Metformin like you have been.

## 2011-10-15 NOTE — Progress Notes (Signed)
  Subjective:    Patient ID: Wyatt Sandoval, male    DOB: [birth year], 51 y.o.   MRN: [id number]  HPI Diabetes:  Currently on Metformin.  Not taking regularly.    No adverse effects from medication.  No hypoglycemic events.  No paresthesia or peripheral nerve pain.  Measures blood sugars at home every:    Does not measure at home. Lab Results  Component Value Date   HGBA1C 7.1 10/13/2011   Boil:  Present on buttocks, 2 of them, for past week or so.  Resolving.  Drained on their own.  No further pain.  No bleeding.  Located on cheek of buttocks, not hemorrhoid.  URI symptoms:  Present for past 5 - 7 days.  Describes rhinorrhea, sinus congestion, mild cough.  Has tried Mucinex without relief.  Sick contacts are none.  No fevers or chills. No nausea or vomiting.    Tobacco cessation:  Desires to quit.  Long-term smoker of over 20 years.  Has quit successfully in past.  States that too expensive now.  Quit before for about 6 months with Nicotine patch.  Does not want Chantix b/c of side effects he has heard about.    The following portions of the patient's history were reviewed and updated as appropriate: allergies, current medications, past medical history, family and social history, and problem list.  Patient is a current smoker.   Review of Systems See HPI above for review of systems.       Objective:   Physical Exam Gen:  Alert, cooperative patient who appears stated age in no acute distress.  Vital signs reviewed. HEENT:  [place]/AT.  EOMI, PERRL.  MMM, tonsils non-erythematous, non-edematous.  External ears WNL, Bilateral TM's normal without retraction, redness or bulging.  Cardiac:  Regular rate and rhythm without murmur auscultated.  Good S1/S2. Pulm:  Clear to auscultation bilaterally with good air movement.  No wheezes or rales noted.   Skin:  Deferred check of his boils Ext:  No edema noted BL       Assessment & Plan:

## 2011-10-16 DIAGNOSIS — L0232 Furuncle of buttock: Secondary | ICD-10-CM | POA: Insufficient documentation

## 2011-10-16 DIAGNOSIS — J069 Acute upper respiratory infection, unspecified: Secondary | ICD-10-CM | POA: Insufficient documentation

## 2011-10-16 NOTE — Assessment & Plan Note (Signed)
Ready to quit. Discussed options - patch plus quitline plus Zyban. FU in 1 month to re-assess

## 2011-10-16 NOTE — Assessment & Plan Note (Signed)
A1C up, not quite at goal today. Stressed compliance as he has not been taking medications.  Was controlled on 500 mg Metformin previously. FU in 3 months.

## 2011-10-16 NOTE — Assessment & Plan Note (Signed)
Patient deferred exam. Bactrim and FU if worsening, systemic symptoms, or not resolving

## 2011-10-16 NOTE — Assessment & Plan Note (Signed)
Symptomatic treatment. Worsened by smoking Bactrim for boil will help if bacterial, most likely viral

## 2011-10-16 NOTE — Assessment & Plan Note (Signed)
Lipid panel today

## 2011-10-19 ENCOUNTER — Encounter: Payer: Medicare Other | Admitting: Home Health Services

## 2011-10-20 ENCOUNTER — Ambulatory Visit: Payer: Medicare Other | Admitting: *Deleted

## 2011-10-20 VITALS — BP 138/87

## 2011-10-20 NOTE — Progress Notes (Signed)
Patient ID: Wyatt Sandoval, male   DOB: 1960-09-02, 51 y.o.   MRN: [id number] Pt was originally scheduled on MD list, but provider advised that if he was only here for a BP check then he did not need to be seen by PCP, unless had other issues.  Pt states that he did not have other issues.  BP check and precepted with PCP and pt was sent home.

## 2011-10-22 NOTE — Addendum Note (Signed)
Addended byGwendolyn Grant, Gaetano Romberger H on: 10/22/2011 02:20 PM   Modules accepted: Orders

## 2011-10-27 ENCOUNTER — Telehealth: Payer: Self-pay | Admitting: *Deleted

## 2011-10-27 NOTE — Telephone Encounter (Signed)
Message copied by Aram Beecham on Tue Oct 27, 2011 11:53 AM ------      Message from: Gwendolyn Grant, JEFFREY H      Created: Thu Oct 22, 2011  2:20 PM       Can we call Mr. Goeller in the next day or so and tell him his liver enzymes were elevated?  Not sure why, we will need to repeat them as well as some other tests. I've already put in the orders for them as future orders.  I'll call him once those results come in.            Thanks!            Trey Paula                  ----- Message -----         From: Bonnie Swaziland         Sent: 10/13/2011   9:45 AM           To: Tobey Grim, MD

## 2011-10-27 NOTE — Telephone Encounter (Signed)
Spoke with patient and informed him of message from MD, scheduled patient to come in for labs tomorrow.

## 2011-10-28 ENCOUNTER — Other Ambulatory Visit: Payer: Medicare Other

## 2011-10-28 DIAGNOSIS — E785 Hyperlipidemia, unspecified: Secondary | ICD-10-CM

## 2011-10-28 DIAGNOSIS — R7401 Elevation of levels of liver transaminase levels: Secondary | ICD-10-CM

## 2011-10-28 LAB — COMPREHENSIVE METABOLIC PANEL
ALT: 62 U/L — ABNORMAL HIGH (ref 0–53)
Albumin: 4.3 g/dL (ref 3.5–5.2)
CO2: 25 mEq/L (ref 19–32)
Chloride: 105 mEq/L (ref 96–112)
Glucose, Bld: 170 mg/dL — ABNORMAL HIGH (ref 70–99)
Potassium: 4.3 mEq/L (ref 3.5–5.3)
Sodium: 139 mEq/L (ref 135–145)
Total Protein: 6.8 g/dL (ref 6.0–8.3)

## 2011-10-28 LAB — HEPATIC FUNCTION PANEL
ALT: 60 U/L — ABNORMAL HIGH (ref 0–53)
Alkaline Phosphatase: 65 U/L (ref 39–117)
Bilirubin, Direct: 0.1 mg/dL (ref 0.0–0.3)
Indirect Bilirubin: 0.3 mg/dL (ref 0.0–0.9)
Total Protein: 6.8 g/dL (ref 6.0–8.3)

## 2011-10-28 LAB — LIPID PANEL
Cholesterol: 153 mg/dL (ref 0–200)
LDL Cholesterol: 85 mg/dL (ref 0–99)
Triglycerides: 110 mg/dL (ref <150)
VLDL: 22 mg/dL (ref 0–40)

## 2011-10-28 NOTE — Progress Notes (Signed)
LABS DRAWN TODAY Wyatt Sandoval

## 2011-10-29 LAB — HEPATITIS PANEL, ACUTE
Hep A IgM: NEGATIVE
Hep B C IgM: NEGATIVE

## 2011-10-30 ENCOUNTER — Other Ambulatory Visit: Payer: Self-pay | Admitting: Family Medicine

## 2011-10-30 ENCOUNTER — Telehealth: Payer: Self-pay | Admitting: Family Medicine

## 2011-10-30 NOTE — Telephone Encounter (Signed)
Called and discussed Hepatitis C results with patient.  Obviously upset.  Answered all of his questions.  Plan to refer to Hep C Clinic.  He will await their call.

## 2011-11-02 ENCOUNTER — Telehealth: Payer: Self-pay | Admitting: *Deleted

## 2011-11-02 ENCOUNTER — Other Ambulatory Visit: Payer: Self-pay | Admitting: Family Medicine

## 2011-11-02 DIAGNOSIS — B1920 Unspecified viral hepatitis C without hepatic coma: Secondary | ICD-10-CM

## 2011-11-02 NOTE — Telephone Encounter (Signed)
error 

## 2011-11-12 ENCOUNTER — Other Ambulatory Visit: Payer: Self-pay | Admitting: *Deleted

## 2011-12-07 ENCOUNTER — Other Ambulatory Visit: Payer: Self-pay | Admitting: Family Medicine

## 2012-01-06 ENCOUNTER — Other Ambulatory Visit: Payer: Self-pay | Admitting: Family Medicine

## 2012-01-20 ENCOUNTER — Other Ambulatory Visit: Payer: Self-pay | Admitting: Family Medicine

## 2012-01-20 NOTE — Telephone Encounter (Signed)
Approved medication. Patient needs to make an appointment for yearly physical and meet his new PCP in October/November to continue receiving refills on medications.   Thank you.

## 2012-02-03 ENCOUNTER — Other Ambulatory Visit: Payer: Self-pay | Admitting: Family Medicine

## 2012-02-12 ENCOUNTER — Ambulatory Visit (INDEPENDENT_AMBULATORY_CARE_PROVIDER_SITE_OTHER): Payer: Medicare Other | Admitting: Family Medicine

## 2012-02-12 ENCOUNTER — Encounter: Payer: Self-pay | Admitting: Family Medicine

## 2012-02-12 VITALS — BP 155/82 | HR 115 | Temp 98.5°F | Ht 67.0 in | Wt 207.4 lb

## 2012-02-12 DIAGNOSIS — R768 Other specified abnormal immunological findings in serum: Secondary | ICD-10-CM

## 2012-02-12 DIAGNOSIS — I10 Essential (primary) hypertension: Secondary | ICD-10-CM | POA: Insufficient documentation

## 2012-02-12 DIAGNOSIS — F329 Major depressive disorder, single episode, unspecified: Secondary | ICD-10-CM

## 2012-02-12 DIAGNOSIS — E119 Type 2 diabetes mellitus without complications: Secondary | ICD-10-CM

## 2012-02-12 DIAGNOSIS — R894 Abnormal immunological findings in specimens from other organs, systems and tissues: Secondary | ICD-10-CM

## 2012-02-12 DIAGNOSIS — B1920 Unspecified viral hepatitis C without hepatic coma: Secondary | ICD-10-CM | POA: Insufficient documentation

## 2012-02-12 DIAGNOSIS — Z23 Encounter for immunization: Secondary | ICD-10-CM

## 2012-02-12 HISTORY — DX: Essential (primary) hypertension: I10

## 2012-02-12 MED ORDER — BUPROPION HCL ER (SR) 150 MG PO TB12: 150.0000 mg | ORAL_TABLET | Freq: Two times a day (BID) | ORAL | Status: DC

## 2012-02-12 MED ORDER — ENALAPRIL MALEATE 2.5 MG PO TABS: 2.5000 mg | ORAL_TABLET | Freq: Every day | ORAL | Status: DC

## 2012-02-12 NOTE — Patient Instructions (Addendum)
Hepatitis C Hepatitis C is a liver infection. It is caused by a germ called hepatitis C virus (HCV). This germ enters the body through blood and other bodily fluids. HOME CARE  Do not drink alcohol.  Only take medicines as told by your doctor.  Keep all blood test visits as told by your doctor.  Do not have sex until your doctor says it is okay.  Avoid activities that could put other people in contact with your blood. Do not share a toothbrush, nail clippers, razors, or needles.  Follow your doctor's advice to avoid spreading the infection to others. GET HELP RIGHT AWAY IF:  You feel very tired or weak.  You have a temperature by mouth above 102 F (38.9 C), not controlled by medicine.  You do not feel like eating (loss of appetite).  You feel sick to your stomach (nauseous) or throw up (vomit).  Your skin or the whites of your eyes turn yellow (jaundice).  You bruise or bleed easily.  You have any severe problems after treatment. MAKE SURE YOU:  Understand these instructions.  Will watch your condition.  Will get help right away if you are not doing well or get worse. Document Released: 04/02/2008 Document Revised: 07/13/2011 Document Reviewed: 08/21/2010 [facility] Patient Information 2013 [place], [place].    [facility] [place] [facility] [place] at [facility] Department of Medicine [facility] liver program [address] [place], [place] [address] [phone]

## 2012-02-12 NOTE — Addendum Note (Signed)
Addended by: Felix Pacini A on: 02/12/2012 02:59 PM   Modules accepted: Orders

## 2012-02-12 NOTE — Progress Notes (Signed)
Subjective:     Patient ID: Wyatt Sandoval, male   DOB: 25-Dec-1960, 51 y.o.   MRN: [id number]  HPI Hypertension: Patient did not follow through with his medications last visit. His blood pressure today is elevated. He has been having headaches and is fatigued. He is ready to do something about it.  Hep C: Patient had a Hep C screening in June that was positive. He was appropriately contacted yet he did not want to admit the diagnosis so he ignored the correspondences and just now is coming in to look into it. He has felt fatigued, but denies fevers, night sweats or abd pain.    Review of Systems    See above HPI Objective:   Physical Exam  Constitutional: He appears well-developed and well-nourished. No distress.  HENT:  Head: Normocephalic and atraumatic.  Eyes: Conjunctivae normal and EOM are normal. Pupils are equal, round, and reactive to light. Right eye exhibits no discharge. Left eye exhibits no discharge. No scleral icterus.  Neck: Normal range of motion. Neck supple.  Cardiovascular: Normal rate, regular rhythm, normal heart sounds and intact distal pulses.  Exam reveals no gallop and no friction rub.   No murmur heard. Pulmonary/Chest: Effort normal and breath sounds normal. No respiratory distress. He has no wheezes. He has no rales.  Abdominal: Soft. Bowel sounds are normal. He exhibits no distension and no mass. There is no tenderness. There is no rebound and no guarding.  Lymphadenopathy:    He has no cervical adenopathy.  Psychiatric: He has a normal mood and affect.   BP 155/82  Pulse 115  Temp 98.5 F (36.9 C) (Oral)  Ht 5\' 7"  (1.702 m)  Wt 207 lb 6.4 oz (94.076 kg)  BMI 32.48 kg/m2

## 2012-02-12 NOTE — Assessment & Plan Note (Signed)
BP 155/82  Pulse 115  Temp 98.5 F (36.9 C) (Oral)  Ht 5\' 7"  (1.702 m)  Wt 207 lb 6.4 oz (94.076 kg)  BMI 32.48 kg/m2 - Patient did not follow through with medications last visit.  - re-prescribed enalapril today 2.5 - recheck BP in 1 mo

## 2012-02-12 NOTE — Assessment & Plan Note (Signed)
-   Previous history of depression. - asked after appointment closed if he could get back on the Wellbutrin - Will f/u in 1 month

## 2012-02-12 NOTE — Assessment & Plan Note (Addendum)
-   A1C today: 6.8 - Continue current regimen

## 2012-02-12 NOTE — Assessment & Plan Note (Addendum)
-   Previous Hep C screen positive - Drawing quant Hep C today - will call patient with the result - Information on Hep C and Hep C clinic given  [facility] [place] [facility] [place] at [facility] Department of Medicine [facility] liver program [address] [place], [place] [address] [phone]

## 2012-02-15 LAB — HEPATITIS C RNA QUANTITATIVE: HCV Quantitative: 159302 IU/mL — ABNORMAL HIGH (ref <15)

## 2012-02-16 ENCOUNTER — Telehealth: Payer: Self-pay | Admitting: Family Medicine

## 2012-02-16 ENCOUNTER — Encounter: Payer: Self-pay | Admitting: Family Medicine

## 2012-02-16 NOTE — Telephone Encounter (Signed)
Message left to return call. Will forward to Dr. Claiborne Billings also.

## 2012-02-16 NOTE — Telephone Encounter (Signed)
Patient states he started medication on Sat or Sun. Since starting stomach feels uneasy, like butterflies in stomach. Sweats then has cold chills. Heart racing.  Advised will forward message to MD and call him back.

## 2012-02-16 NOTE — Telephone Encounter (Signed)
Was put on Wellbutrin and feels really funny with it and wants to know if he can stop taking it.

## 2012-02-17 ENCOUNTER — Other Ambulatory Visit: Payer: Self-pay | Admitting: Family Medicine

## 2012-02-29 ENCOUNTER — Other Ambulatory Visit: Payer: Self-pay | Admitting: Family Medicine

## 2012-03-10 ENCOUNTER — Encounter: Payer: Self-pay | Admitting: Family Medicine

## 2012-03-10 ENCOUNTER — Ambulatory Visit (INDEPENDENT_AMBULATORY_CARE_PROVIDER_SITE_OTHER): Payer: Medicare Other | Admitting: Family Medicine

## 2012-03-10 VITALS — BP 145/77 | HR 88 | Temp 97.5°F | Ht 67.0 in | Wt 209.2 lb

## 2012-03-10 DIAGNOSIS — G5600 Carpal tunnel syndrome, unspecified upper limb: Secondary | ICD-10-CM

## 2012-03-10 DIAGNOSIS — I10 Essential (primary) hypertension: Secondary | ICD-10-CM

## 2012-03-10 DIAGNOSIS — F329 Major depressive disorder, single episode, unspecified: Secondary | ICD-10-CM

## 2012-03-10 DIAGNOSIS — G5603 Carpal tunnel syndrome, bilateral upper limbs: Secondary | ICD-10-CM

## 2012-03-10 DIAGNOSIS — F17200 Nicotine dependence, unspecified, uncomplicated: Secondary | ICD-10-CM

## 2012-03-10 DIAGNOSIS — E119 Type 2 diabetes mellitus without complications: Secondary | ICD-10-CM

## 2012-03-10 DIAGNOSIS — B1920 Unspecified viral hepatitis C without hepatic coma: Secondary | ICD-10-CM

## 2012-03-10 LAB — POCT URINALYSIS DIPSTICK
Bilirubin, UA: NEGATIVE
Blood, UA: NEGATIVE
Ketones, UA: NEGATIVE
Nitrite, UA: NEGATIVE
Spec Grav, UA: <=1.005
pH, UA: 6

## 2012-03-10 LAB — POCT UA - MICROALBUMIN
Albumin/Creatinine Ratio, Urine, POC: ABNORMAL
Creatinine, POC: 10 mg/dL

## 2012-03-10 MED ORDER — AMITRIPTYLINE HCL 150 MG PO TABS: 150.0000 mg | ORAL_TABLET | Freq: Every day | ORAL | Status: DC

## 2012-03-10 MED ORDER — ENALAPRIL MALEATE 5 MG PO TABS: 5.0000 mg | ORAL_TABLET | Freq: Every day | ORAL | Status: DC

## 2012-03-10 MED ORDER — AMITRIPTYLINE HCL 75 MG PO TABS: 112.5000 mg | ORAL_TABLET | Freq: Every day | ORAL | Status: DC

## 2012-03-10 MED ORDER — ENALAPRIL MALEATE 2.5 MG PO TABS: 5.0000 mg | ORAL_TABLET | Freq: Every day | ORAL | Status: DC

## 2012-03-10 NOTE — Progress Notes (Signed)
Subjective:     Patient ID: Wyatt Sandoval, male   DOB: [birth year], 51 y.o.   MRN: [id number]  HPI  Hypertension: Wyatt Sandoval is here today for a BP check. It is still slightly high and not at goal for his diabetic condition. He has had issues with impotence, which he does not know if it was from this mediation, diabetes or age. His last A1c was 6.8 and he is in control of his diabetes. He is not experiencing headaches or visual changes. However he is still experiencing fatigue.  Smoking Cessation: Last visit he asked to be placed back on Wellbutrin. However, he called stating it made him ill this time. He did  Continue to take this medication and now is doing well. He has been smoke free for over a week.  Review of Systems See above HPI    Objective:   Physical Exam BP 145/77  Pulse 88  Temp 97.5 F (36.4 C) (Oral)  Ht 5\' 7"  (1.702 m)  Wt 209 lb 3.2 oz (94.892 kg)  BMI 32.77 kg/m2  Gen: Well NAD HEENT: MMM Lungs: CTABL Heart: RRR no murmur BMW:UXLKG. NT.ND. No HSM.

## 2012-03-10 NOTE — Assessment & Plan Note (Signed)
-   smoking cessation begun 10/11 - Wellbutrin 150 mg BID - Doing well now, believes he has it "licked" - Smoke-free over a week. - F/u: PRN

## 2012-03-10 NOTE — Progress Notes (Deleted)
Patient ID: Wyatt Sandoval, male   DOB: [birth year], 51 y.o.   MRN: [id number]

## 2012-03-10 NOTE — Assessment & Plan Note (Signed)
-   INR, BMET in two weeks

## 2012-03-10 NOTE — Assessment & Plan Note (Signed)
-   UA today with microalbumin

## 2012-03-10 NOTE — Patient Instructions (Signed)
Hepatitis C Hepatitis C is a liver infection. It is caused by a germ called hepatitis C virus (HCV). This germ enters the body through blood and other bodily fluids. HOME CARE  Do not drink alcohol.  Only take medicines as told by your doctor.  Keep all blood test visits as told by your doctor.  Do not have sex until your doctor says it is okay.  Avoid activities that could put other people in contact with your blood. Do not share a toothbrush, nail clippers, razors, or needles.  Follow your doctor's advice to avoid spreading the infection to others. GET HELP RIGHT AWAY IF:  You feel very tired or weak.  You have a temperature by mouth above 102 F (38.9 C), not controlled by medicine.  You do not feel like eating (loss of appetite).  You feel sick to your stomach (nauseous) or throw up (vomit).  Your skin or the whites of your eyes turn yellow (jaundice).  You bruise or bleed easily.  You have any severe problems after treatment. MAKE SURE YOU:  Understand these instructions.  Will watch your condition.  Will get help right away if you are not doing well or get worse. Document Released: 04/02/2008 Document Revised: 07/13/2011 Document Reviewed: 08/21/2010 [facility] Patient Information 2013 [place], [place].

## 2012-03-10 NOTE — Assessment & Plan Note (Addendum)
-   Not meeting diabetic goal for HTN - Discussed diet/exercise: He is going to attempt to do better - Increased vasotec to 5mg  daily today. - INR and BMET in two weeks for Hep c - FU: 3 months

## 2012-03-11 ENCOUNTER — Encounter: Payer: Self-pay | Admitting: Family Medicine

## 2012-03-23 ENCOUNTER — Other Ambulatory Visit: Payer: Medicare Other

## 2012-03-23 DIAGNOSIS — B1920 Unspecified viral hepatitis C without hepatic coma: Secondary | ICD-10-CM

## 2012-03-23 LAB — BASIC METABOLIC PANEL
CO2: 25 mEq/L (ref 19–32)
Chloride: 101 mEq/L (ref 96–112)
Glucose, Bld: 169 mg/dL — ABNORMAL HIGH (ref 70–99)
Potassium: 4.5 mEq/L (ref 3.5–5.3)
Sodium: 137 mEq/L (ref 135–145)

## 2012-03-23 NOTE — Progress Notes (Signed)
BMP AND PT DONE TODAY Wyatt Sandoval

## 2012-03-24 ENCOUNTER — Other Ambulatory Visit: Payer: Medicare Other

## 2012-03-26 ENCOUNTER — Other Ambulatory Visit: Payer: Self-pay | Admitting: Family Medicine

## 2012-05-09 ENCOUNTER — Other Ambulatory Visit: Payer: Self-pay | Admitting: Family Medicine

## 2012-05-12 ENCOUNTER — Telehealth: Payer: Self-pay | Admitting: Family Medicine

## 2012-05-12 ENCOUNTER — Other Ambulatory Visit: Payer: Self-pay | Admitting: *Deleted

## 2012-05-12 NOTE — Telephone Encounter (Signed)
Pt is trying to get his 2 meds from pharmacy and they are telling him that they do not have it yet.  Clearly says that it has been sent electronically

## 2012-05-12 NOTE — Telephone Encounter (Signed)
Called pharmacy and RX have been ready for pick up for couple of days. Patient notified.

## 2012-05-13 ENCOUNTER — Encounter: Payer: Self-pay | Admitting: Family Medicine

## 2012-05-13 ENCOUNTER — Ambulatory Visit (INDEPENDENT_AMBULATORY_CARE_PROVIDER_SITE_OTHER): Payer: Medicare Other | Admitting: Family Medicine

## 2012-05-13 ENCOUNTER — Other Ambulatory Visit: Payer: Self-pay | Admitting: Family Medicine

## 2012-05-13 VITALS — BP 108/70 | HR 118 | Temp 98.1°F | Ht 67.0 in | Wt 214.0 lb

## 2012-05-13 DIAGNOSIS — F329 Major depressive disorder, single episode, unspecified: Secondary | ICD-10-CM

## 2012-05-13 DIAGNOSIS — B1920 Unspecified viral hepatitis C without hepatic coma: Secondary | ICD-10-CM

## 2012-05-13 DIAGNOSIS — R21 Rash and other nonspecific skin eruption: Secondary | ICD-10-CM | POA: Insufficient documentation

## 2012-05-13 DIAGNOSIS — F17200 Nicotine dependence, unspecified, uncomplicated: Secondary | ICD-10-CM

## 2012-05-13 MED ORDER — BUPROPION HCL ER (SR) 150 MG PO TB12: 150.0000 mg | ORAL_TABLET | Freq: Two times a day (BID) | ORAL | Status: DC

## 2012-05-13 MED ORDER — CLOTRIMAZOLE 1 % EX CREA: TOPICAL_CREAM | Freq: Two times a day (BID) | CUTANEOUS | Status: DC

## 2012-05-13 MED ORDER — DOXYCYCLINE HYCLATE 100 MG PO TABS: 100.0000 mg | ORAL_TABLET | Freq: Two times a day (BID) | ORAL | Status: DC

## 2012-05-13 NOTE — Assessment & Plan Note (Addendum)
Looks like tinea. Not in an area of friction. I checked his shoes as well and he does not appear to have any rubbing. He does have some redness and therefore, along with a history of his questionable purulence, we'll treat him with doxycycline to ensure that he does not have cellulitis. Skin KOH sent. Lotrimin to treat presumptively

## 2012-05-13 NOTE — Patient Instructions (Addendum)
Lotrimin for your foot.    Take the Doxycyline (antiobiotic) for next 10 days.    Hepatitis B shot today.    Welbutrin sent in.  Will call with results of skin test.    Call Hepatitis Clinic to be seen between March and May.

## 2012-05-13 NOTE — Progress Notes (Signed)
  Subjective:    Patient ID: Wyatt Sandoval, male    DOB: Aug 07, 1960, 52 y.o.   MRN: [id number]  HPI #1. Wound on foot: Patient has had a "spot" on his foot for the past month or so. He denies any pain. He has good sensation in his feet. He does complain of intense pruritus. He did note about one to 2 weeks ago it looked like pus drained out of the area. He has not noticed any heat or redness. No fevers or chills. No nausea or vomiting. He has not tried to put anything on the spot.  #2. Smoking cessation: Patient quit smoking for about 2 months with Wellbutrin in the past. However over the holidays he says his stress increased and therefore he started smoking again. He would like refill for Wellbutrin today to help with his smoking. Smoking about one half to one pack a day.  Review of Systems See HPI above for review of systems.       Objective:   Physical Exam  BP 108/70  Pulse 118  Temp 98.1 F (36.7 C)  Ht 5\' 7"  (1.702 m)  Wt 214 lb (97.07 kg)  BMI 33.52 kg/m2 Gen: Well NAD HEENT: EOMI,  MMM Lungs: CTABL Nl WOB Heart: RRR no MRG Exts: Non edematous BL  LE, warm and well perfused.  Foot:  Right foot within normal limits. Left foot with scaly erythematous rash on medial aspect of that foot. Does not appear to be in an area of friction. I could not palpate much warmth here. No fluctuance or induration. Easily scraped off skin for KOH. No streaking up the leg.    Assessment & Plan:

## 2012-05-13 NOTE — Assessment & Plan Note (Signed)
Recommended hepatitis B shot for him today. Agreed to this initially. However then he stated he had to leave he'll get his hepatitis B vaccination next appointment.

## 2012-05-13 NOTE — Assessment & Plan Note (Signed)
Did well initially Wellbutrin. Wants a refill today. Refill provided. Declined any nicotine replacement

## 2012-05-15 ENCOUNTER — Other Ambulatory Visit: Payer: Self-pay | Admitting: Family Medicine

## 2012-06-06 ENCOUNTER — Ambulatory Visit (INDEPENDENT_AMBULATORY_CARE_PROVIDER_SITE_OTHER): Payer: Medicare Other | Admitting: Family Medicine

## 2012-06-06 ENCOUNTER — Encounter: Payer: Self-pay | Admitting: Family Medicine

## 2012-06-06 VITALS — BP 152/88 | HR 100 | Temp 98.0°F | Ht 67.0 in | Wt 213.5 lb

## 2012-06-06 DIAGNOSIS — Z23 Encounter for immunization: Secondary | ICD-10-CM

## 2012-06-06 DIAGNOSIS — R21 Rash and other nonspecific skin eruption: Secondary | ICD-10-CM

## 2012-06-06 DIAGNOSIS — E119 Type 2 diabetes mellitus without complications: Secondary | ICD-10-CM

## 2012-06-06 DIAGNOSIS — B1920 Unspecified viral hepatitis C without hepatic coma: Secondary | ICD-10-CM

## 2012-06-06 DIAGNOSIS — I10 Essential (primary) hypertension: Secondary | ICD-10-CM

## 2012-06-06 DIAGNOSIS — G5603 Carpal tunnel syndrome, bilateral upper limbs: Secondary | ICD-10-CM

## 2012-06-06 DIAGNOSIS — G5600 Carpal tunnel syndrome, unspecified upper limb: Secondary | ICD-10-CM

## 2012-06-06 DIAGNOSIS — F329 Major depressive disorder, single episode, unspecified: Secondary | ICD-10-CM

## 2012-06-06 MED ORDER — METFORMIN HCL 500 MG PO TABS: 500.0000 mg | ORAL_TABLET | Freq: Three times a day (TID) | ORAL | Status: DC

## 2012-06-06 MED ORDER — CLOTRIMAZOLE 1 % EX CREA: TOPICAL_CREAM | Freq: Two times a day (BID) | CUTANEOUS | Status: DC

## 2012-06-06 MED ORDER — BUPROPION HCL ER (SR) 150 MG PO TB12: 150.0000 mg | ORAL_TABLET | Freq: Two times a day (BID) | ORAL | Status: DC

## 2012-06-06 MED ORDER — ENALAPRIL MALEATE 5 MG PO TABS: 5.0000 mg | ORAL_TABLET | Freq: Every day | ORAL | Status: DC

## 2012-06-06 MED ORDER — AMITRIPTYLINE HCL 150 MG PO TABS: 150.0000 mg | ORAL_TABLET | Freq: Every day | ORAL | Status: DC

## 2012-06-06 NOTE — Assessment & Plan Note (Signed)
-   Rash is basically the same with small improvement. No areas of erythema or drainage, suggest no bacteria infection. Doxycycline course has been completed for a week.  - appears fungal in nature, will await culture results and alter therapy at that time accordingly or derm referral.  - Fungal Cultures pending with a little over a week left for growth. KOH was negative during last visit - Lotrimin reordered today.  - Will call patient with results once available.

## 2012-06-06 NOTE — Assessment & Plan Note (Signed)
-   Discussed diet and exercise today in length. Patient is fully aware of his lack of compliance with his diabetic diet and lifestyle. He has decided to adjust his diet, by staying away from complex carbs and exercising on his bike 3-5 times a week.  - His BP has been elevated higher than it should with his DM. At this time control of his DM and lifestyle changes will be attempted. Patient is under a great deal of stress with his Hep C diagnosis and this is also affecting his BP. He has an appointment this Wednesday with [facility] to have test performed concerning his Hep C.  - Re-evaluate his BP and weight in 6-8 weeks after changes have been implemented and stressful situating has been resolved. If BP still not in range will add/increase medications at that visit.

## 2012-06-06 NOTE — Patient Instructions (Signed)
It was good to see you again today. Work on [employer] and exercise as discussed, and I would like to see ou back in 6-8 weeks to check your BP and foot. Good luck this week with your appt. At [facility].  Please make an appt on your way for me in 6-8 weeks and Dr. Raymondo Band as soon as you can get in for smoking cessation.  Diets for Diabetes, Food Labeling Look at food labels to help you decide how much of a product you can eat. You will want to check the amount of total carbohydrate in a serving to see how the food fits into your meal plan. In the list of ingredients, the ingredient present in the largest amount by weight must be listed first, followed by the other ingredients in descending order. STANDARD OF IDENTITY Most products have a list of ingredients. However, foods that the Food and Drug Administration (FDA) has given a standard of identity do not need a list of ingredients. A standard of identity means that a food must contain certain ingredients if it is called a particular name. Examples are mayonnaise, peanut butter, ketchup, jelly, and cheese. LABELING TERMS There are many terms found on food labels. Some of these terms have specific definitions. Some terms are regulated by the FDA, and the FDA has clearly specified how they can be used. Others are not regulated or well-defined and can be misleading and confusing. SPECIFICALLY DEFINED TERMS Nutritive Sweetener.  A sweetener that contains calories,such as table sugar or honey. Nonnutritive Sweetener.  A sweetener with few or no calories,such as saccharin, aspartame, sucralose, and cyclamate. LABELING TERMS REGULATED BY THE FDA Free.  The product contains only a tiny or small amount of fat, cholesterol, sodium, sugar, or calories. For example, a "fat-free" product will contain less than 0.5 g of fat per serving. Low.  A food described as "low" in fat, saturated fat, cholesterol, sodium, or calories could be eaten fairly often without  exceeding dietary guidelines. For example, "low in fat" means no more than 3 g of fat per serving. Lean.  "Lean" and "extra lean" are U.S. Department of Agriculture Architect) terms for use on meat and poultry products. "Lean" means the product contains less than 10 g of fat, 4 g of saturated fat, and 95 mg of cholesterol per serving. "Lean" is not as low in fat as a product labeled "low." Extra Lean.  "Extra lean" means the product contains less than 5 g of fat, 2 g of saturated fat, and 95 mg of cholesterol per serving. While "extra lean" has less fat than "lean," it is still higher in fat than a product labeled "low." Reduced, Less, Fewer.  A diet product that contains 25% less of a nutrient or calories than the regular version. For example, hot dogs might be labeled "25% less fat than our regular hot dogs." Light/Lite.  A diet product that contains  fewer calories or  the fat of the original. For example, "light in sodium" means a product with  the usual sodium. More.  One serving contains at least 10% more of the daily value of a vitamin, mineral, or fiber than usual. Good Source Of.  One serving contains 10% to 19% of the daily value for a particular vitamin, mineral, or fiber. Excellent Source Of.  One serving contains 20% or more of the daily value for a particular nutrient. Other terms used might be "high in" or "rich in." Enriched or Fortified.  The product  contains added vitamins, minerals, or protein. Nutrition labeling must be used on enriched or fortified foods. Imitation.  The product has been altered so that it is lower in protein, vitamins, or minerals than the usual food,such as imitation peanut butter. Total Fat.  The number listed is the total of all fat found in a serving of the product. Under total fat, food labels must list saturated fat and trans fat, which are associated with raising bad cholesterol and an increased risk of heart blood vessel disease. Saturated  Fat.  Mainly fats from animal-based sources. Some examples are red meat, cheese, cream, whole milk, and coconut oil. Trans Fat.  Found in some fried snack foods, packaged foods, and fried restaurant foods. It is recommended you eat as close to 0 g of trans fat as possible, since it raises bad cholesterol and lowers good cholesterol. Polyunsaturated and Monounsaturated Fats.  More healthful fats. These fats are from plant sources. Total Carbohydrate.  The number of carbohydrate grams in a serving of the product. Under total carbohydrate are listed the other carbohydrate sources, such as dietary fiber and sugars. Dietary Fiber.  A carbohydrate from plant sources. Sugars.  Sugars listed on the label contain all naturally occurring sugars as well as added sugars. LABELING TERMS NOT REGULATED BY THE FDA Sugarless.  Table sugar (sucrose) has not been added. However, the manufacturer may use another form of sugar in place of sucrose to sweeten the product. For example, sugar alcohols are used to sweeten foods. Sugar alcohols are a form of sugar but are not table sugar. If a product contains sugar alcohols in place of sucrose, it can still be labeled "sugarless." Low Salt, Salt-Free, Unsalted, No Salt, No Salt Added, Without Added Salt.  Food that is usually processed with salt has been made without salt. However, the food may contain sodium-containing additives, such as preservatives, leavening agents, or flavorings. Natural.  This term has no legal meaning. Organic.  Foods that are certified as organic have been inspected and approved by the USDA to ensure they are produced without pesticides, fertilizers containing synthetic ingredients, bioengineering, or ionizing radiation. Document Released: 04/23/2003 Document Revised: 07/13/2011 Document Reviewed: 11/08/2008 [facility] Patient Information 2013 [place], [place].

## 2012-06-06 NOTE — Progress Notes (Signed)
  Subjective:    Patient ID: Wyatt Sandoval, male    DOB: 19-Dec-1960, 52 y.o.   MRN: [id number]  HPI Diabetes Mellitus: Patient is present today for a f/u to his diabetes. He reports he has been not following his diet and eating more sweets, especially at night. He endorses to taking his medications as prescribed. He admits to increase in anxiety that he believes is affecting his BP and diabetes. He does not report numbness, tingling in his extremities, difficulty with urination, dizziness or increased in fatigue. He is having occasional problems with impotence, he believes he is still "alright" and just needs to get his diabetes and diet under control.  Foot infection: Patient was recently seen in the clinic for a possible cellulitis vs fungal infection of the foot. His left foot has a patch of scaly skin that he reports is tender. The area has not become larger, but has not improved much either. He has been using the Lotrimin cream on it as prescribed, and finished his course of doxycycline. He has denied fever or drainage from the wound. He does not recall any trauma to the area originally. Fungal cultures are in process.  Preventive Medicine: Patient has not had a Tdap in well over 10 years. He would like a vaccination today, but can not afford the co-pay.  Patient is a chronic Hep C patient. Hep B vaccination is desired. 1:3 will be given today.  Smoking Cessation:  Patient has repeatedly attempted to quit smoking. He believes the Wellbutrin has helped in the past, but he quit taking it as prescribed a few months ago. He has picked back up to at least a pack a day. He desires more intervention today.     Review of Systems  Constitutional: Positive for appetite change, fatigue and unexpected weight change. Negative for fever, diaphoresis and activity change.  Eyes: Negative for visual disturbance.  All other systems reviewed and are negative.      Objective:   Physical Exam   Constitutional: He is oriented to person, place, and time. He appears well-developed and well-nourished.       Obese.  Eyes: Conjunctivae normal and EOM are normal. Pupils are equal, round, and reactive to light. Right eye exhibits no discharge. Left eye exhibits no discharge. No scleral icterus.  Cardiovascular: Normal rate and regular rhythm.   Pulmonary/Chest: Effort normal and breath sounds normal.  Abdominal: Soft. Bowel sounds are normal.  Musculoskeletal:       Foot Exam: Bilateral LE +2/4 PT pulses, equal. Bilateral feet well perfused with brisk cap refill. Untrimmed nails, no signs of  A bacteria infection. Left medial foot (arch) with scaly, tender patch about 3 inches wide, with smaller areas spreading to bottom of foot. Microfilament test WNL bilaterally with exception of left 4th toe with slightly diminished sensation.  Neurological: He is alert and oriented to person, place, and time.  Skin: Skin is warm and dry.   BP 152/88  Pulse 100  Temp 98 F (36.7 C) (Oral)  Ht 5\' 7"  (1.702 m)  Wt 213 lb 8 oz (96.843 kg)  BMI 33.44 kg/m2

## 2012-06-09 ENCOUNTER — Other Ambulatory Visit: Payer: Self-pay | Admitting: Family Medicine

## 2012-06-27 ENCOUNTER — Encounter: Payer: Self-pay | Admitting: Pharmacist

## 2012-06-27 ENCOUNTER — Ambulatory Visit (INDEPENDENT_AMBULATORY_CARE_PROVIDER_SITE_OTHER): Payer: Medicare Other | Admitting: Pharmacist

## 2012-06-27 VITALS — BP 153/89 | HR 109 | Wt 211.1 lb

## 2012-06-27 DIAGNOSIS — E119 Type 2 diabetes mellitus without complications: Secondary | ICD-10-CM

## 2012-06-27 DIAGNOSIS — R21 Rash and other nonspecific skin eruption: Secondary | ICD-10-CM

## 2012-06-27 DIAGNOSIS — F17200 Nicotine dependence, unspecified, uncomplicated: Secondary | ICD-10-CM

## 2012-06-27 MED ORDER — BUPROPION HCL ER (XL) 150 MG PO TB24: 150.0000 mg | ORAL_TABLET | Freq: Every day | ORAL | Status: DC

## 2012-06-27 MED ORDER — TERBINAFINE HCL 250 MG PO TABS: 500.0000 mg | ORAL_TABLET | Freq: Every day | ORAL | Status: DC

## 2012-06-27 MED ORDER — METFORMIN HCL 1000 MG PO TABS: 1000.0000 mg | ORAL_TABLET | Freq: Two times a day (BID) | ORAL | Status: DC

## 2012-06-27 NOTE — Assessment & Plan Note (Signed)
Foot infection - possibly fungal - agree with Drs. Mauricio Po and [place] that a short course of terbinafine will be attempted to clear infection if fungal.   Advised patient to allow good ventilation of foot and good hygiene with this therapy.  Will use 500mg  once daily for 7 days and then reevaluate.  Appears liver function is adequate with only modest AST elevation of 43.    Plan follow up via phone late next week AND then office visit in two weeks with me.

## 2012-06-27 NOTE — Assessment & Plan Note (Signed)
Diabetes - suboptimal control of fasting blood glucose.  Will attempt improve CBGS with increase of metformin to 1000mg  twice daily.

## 2012-06-27 NOTE — Assessment & Plan Note (Signed)
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 His complaint of dysphoria necessitated a dose reduction to attempt improve tolerability.  Changed to 150 XL once daily.  Patient counseled on purpose, proper use, and potential adverse effects, including insomnia, and to monitor for improvement in dysphoria.  Patient will also consider restarting patches.   He was instructed to use 21 mg patches for 2 weeks and then consider decrease to 14mg  patches for 1 month.

## 2012-06-27 NOTE — Progress Notes (Signed)
  Subjective:    Patient ID: Wyatt Sandoval, male    DOB: [birth year], 52 y.o.   MRN: [id number]  HPI Patient arrives in clinic for Tobacco Cessation Assistance.  He arrives early and is patient with waiting for ~10 minutes.   Age when started using tobacco on a daily basis 20. Number of Cigarettes per day routinely 1-1.5 ppd recently cut down to 7 cigs per day. Brand smoked KOOL - filtered.. Estimated Nicotine Content per Cigarette (mg) 1mg .  Estimated Nicotine intake per day 7-8 mg.   Smokes first cigarette < 5 minutes after waking.    Estimated Fagerstrom Score 5-6/10.  Most recent quit attempt Fall 2013 - Quit for 1 month with use of bupropion and 21mg  patch.  Longest time ever been tobacco free 1 month last fall What Medications (NRT, bupropion, varenicline) used in past includes bupropion and patch.  Rates IMPORTANCE of quitting tobacco on 1-10 scale of 8-9 Rates READINESS of quitting tobacco on 1-10 scale of 9. Rates CONFIDENCE of quitting tobacco on 1-10 scale of 6. Triggers to use tobacco include; with coffee in the AM, after meals, after work and with alcohol and family gatherings.   He expresses concern over foot infection (he believes it could be ring worm) for which he wants more antibiotics.    He has treated his foot with steroid cream, clotrimazole topical and shea butter.    He has noticed his blood sugar has been increasing with readings consistently ~180.  He is concerned with higher readings. He is interested in being more active and trying to work out more to lose weight.   Review of Systems     Objective:   Physical Exam Left foot Medial aspect of arch has 3 X 4 inch area of erythema and irritation.  Seen with Dr. Mauricio Po and by Dr Claiborne Billings.       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 His  complaint of dysphoria necessitated a dose reduction to attempt improve tolerability.  Changed to 150 XL once daily.  Patient counseled on purpose, proper use, and potential adverse effects, including insomnia, and to monitor for improvement in dysphoria.  Patient will also consider restarting patches.   He was instructed to use 21 mg patches for 2 weeks and then consider decrease to 14mg  patches for 1 month.    Diabetes - suboptimal control of fasting blood glucose.  Will attempt improve CBGS with increase of metformin to 1000mg  twice daily.     Foot infection - possibly fungal - agree with Drs. Mauricio Po and [place] that a short course of terbinafine will be attempted to clear infection if fungal.   Advised patient to allow good ventilation of foot and good hygiene with this therapy.  Will use 500mg  once daily for 7 days and then reevaluate.  Appears liver function is adequate with only modest AST elevation of 43.    Plan follow up via phone late next week AND then office visit in two weeks with me.

## 2012-06-27 NOTE — Patient Instructions (Addendum)
Bupropion - Stop current dose and START 150mg  XL once daily in the AM.     Start Nicotine Patches 21mg  patches - Start today.   TODAY is your quit date!!!  Increase your Metformin to 1000mg  TWICE daily.   Start 2 X 250mg  terbinafine (Lamisil) Tablets for 1 week.    Stop your antifungal creams and steroid creams on your foot.  Allow your foot to be open to air when you can.

## 2012-06-27 NOTE — Progress Notes (Signed)
Patient ID: Wyatt Sandoval, male   DOB: 10-01-1960, 52 y.o.   MRN: [id number] Reviewed: Agree with Dr. Macky Lower documentation and management.

## 2012-07-06 ENCOUNTER — Ambulatory Visit (INDEPENDENT_AMBULATORY_CARE_PROVIDER_SITE_OTHER): Payer: Medicare Other | Admitting: Family Medicine

## 2012-07-06 ENCOUNTER — Encounter: Payer: Self-pay | Admitting: Family Medicine

## 2012-07-06 VITALS — BP 132/74 | HR 113 | Temp 98.2°F | Ht 67.0 in | Wt 210.0 lb

## 2012-07-06 DIAGNOSIS — L301 Dyshidrosis [pompholyx]: Secondary | ICD-10-CM

## 2012-07-06 MED ORDER — TRIAMCINOLONE ACETONIDE 0.1 % EX OINT: TOPICAL_OINTMENT | Freq: Two times a day (BID) | CUTANEOUS | Status: DC

## 2012-07-06 NOTE — Patient Instructions (Addendum)
Pick up Triamcinolone ointment twice per day for 2 weeks, then come back to clinic to recheck wound.  Eczema Atopic dermatitis, or eczema, is an inherited type of sensitive skin. Often people with eczema have a family history of allergies, asthma, or hay fever. It causes a red itchy rash and dry scaly skin. The itchiness may occur before the skin rash and may be very intense. It is not contagious. Eczema is generally worse during the cooler winter months and often improves with the warmth of summer. Eczema usually starts showing signs in infancy. Some children outgrow eczema, but it may last through adulthood. Flare-ups may be caused by:  Eating something or contact with something you are sensitive or allergic to.  Stress. DIAGNOSIS  The diagnosis of eczema is usually based upon symptoms and medical history. TREATMENT  Eczema cannot be cured, but symptoms usually can be controlled with treatment or avoidance of allergens (things to which you are sensitive or allergic to).  Controlling the itching and scratching.  Use over-the-counter antihistamines as directed for itching. It is especially useful at night when the itching tends to be worse.  Use over-the-counter steroid creams as directed for itching.  Scratching makes the rash and itching worse and may cause impetigo (a skin infection) if fingernails are contaminated (dirty).  Keeping the skin well moisturized with creams every day. This will seal in moisture and help prevent dryness. Lotions containing alcohol and water can dry the skin and are not recommended.  Limiting exposure to allergens.  Recognizing situations that cause stress.  Developing a plan to manage stress. HOME CARE INSTRUCTIONS   Take prescription and over-the-counter medicines as directed by your caregiver.  Do not use anything on the skin without checking with your caregiver.  Keep baths or showers short (5 minutes) in warm (not hot) water. Use mild cleansers  for bathing. You may add non-perfumed bath oil to the bath water. It is best to avoid soap and bubble bath.  Immediately after a bath or shower, when the skin is still damp, apply a moisturizing ointment to the entire body. This ointment should be a petroleum ointment. This will seal in moisture and help prevent dryness. The thicker the ointment the better. These should be unscented.  Keep fingernails cut short and wash hands often. If your child has eczema, it may be necessary to put soft gloves or mittens on your child at night.  Dress in clothes made of cotton or cotton blends. Dress lightly, as heat increases itching.  Avoid foods that may cause flare-ups. Common foods include cow's milk, peanut butter, eggs and wheat.  Keep a child with eczema away from anyone with fever blisters. The virus that causes fever blisters (herpes simplex) can cause a serious skin infection in children with eczema. SEEK MEDICAL CARE IF:   Itching interferes with sleep.  The rash gets worse or is not better within one week following treatment.  The rash looks infected (pus or soft yellow scabs).  You or your child has an oral temperature above 102 F (38.9 C).  Your baby is older than 3 months with a rectal temperature of 100.5 F (38.1 C) or higher for more than 1 day.  The rash flares up after contact with someone who has fever blisters. SEEK IMMEDIATE MEDICAL CARE IF:   Your baby is older than 3 months with a rectal temperature of 102 F (38.9 C) or higher.  Your baby is older than 3 months or younger with a  rectal temperature of 100.4 F (38 C) or higher. Document Released: 04/17/2000 Document Revised: 07/13/2011 Document Reviewed: 02/20/2009 [facility] Patient Information 2013 [place], [place].

## 2012-07-08 ENCOUNTER — Encounter: Payer: Self-pay | Admitting: Family Medicine

## 2012-07-08 DIAGNOSIS — L301 Dyshidrosis [pompholyx]: Secondary | ICD-10-CM | POA: Insufficient documentation

## 2012-07-08 HISTORY — DX: Dyshidrosis (pompholyx): L30.1

## 2012-07-08 NOTE — Progress Notes (Signed)
  Subjective:    Patient ID: Wyatt Sandoval, male    DOB: [birth year], 52 y.o.   MRN: [id number]  HPI  52 year old male here for follow up wound on left foot.  He was last seen in the clinic about 4 weeks ago for a possible cellulitis vs fungal infection of the foot.  Left foot has a patch of scaly skin.  He has been using the Lotrimin cream on it as prescribed, finished course of doxycycline.  He was then seen at Pharmacy Clinic and completed a course of Terbinafine x 7 days.  Patient says lesion is very itchy, but not tender anymore.  He does not think it is getting any better.  He has denied fever or drainage from the wound. He does not recall any trauma to the area originally. Fungal cultures - No growth. Micro in process.  Review of Systems Per HPI    Objective:   Physical Exam General: pleasant, well nourished, in no acute distress Skin: LT medial aspect of foot lesion 3-4 in wide, scaly, dry flakes; non-tender; no redness/erythema or drainage MSK: 2+ pedal pulses; normal sensation    Assessment & Plan:

## 2012-07-08 NOTE — Assessment & Plan Note (Signed)
Fungal culture no growth, micro review pending.  Wound looks better without signs of infection.  Will treat with Triamcinolone BID x 2 weeks, and advised patient to follow up with PCP in 2 weeks or sooner if symptoms worsen.  Discussed side effects of long term use of topical steroids.  He understands plan.

## 2012-07-11 ENCOUNTER — Encounter: Payer: Self-pay | Admitting: Pharmacist

## 2012-07-11 ENCOUNTER — Ambulatory Visit (INDEPENDENT_AMBULATORY_CARE_PROVIDER_SITE_OTHER): Payer: Medicare Other | Admitting: Pharmacist

## 2012-07-11 VITALS — BP 122/77 | HR 110 | Wt 213.0 lb

## 2012-07-11 DIAGNOSIS — R21 Rash and other nonspecific skin eruption: Secondary | ICD-10-CM

## 2012-07-11 DIAGNOSIS — Z23 Encounter for immunization: Secondary | ICD-10-CM

## 2012-07-11 DIAGNOSIS — F17200 Nicotine dependence, unspecified, uncomplicated: Secondary | ICD-10-CM

## 2012-07-11 NOTE — Assessment & Plan Note (Signed)
moderate Nicotine Dependence of 34 years duration in a patient who is fair candidate for success b/c of current level of motivation and past brief success attempts however, his social life includes many smokers.    Initiated nicotine replacement tx 21 patch QAM starting next Monday (3/17- [facility]). Patient counseled on purpose, proper use, and potential adverse effects, including need to rotate sites and possibly removing at bedtime if nightmares occur.   F/U Rx Clinic Visit 3 weeks.   Total time in face-to-face counseling 20 minutes.  Patient seen with  Beatrix Shipper, PharmD candidate.

## 2012-07-11 NOTE — Assessment & Plan Note (Signed)
Modestly improved rash area on his left medical arch.   Continue current topical management at this time.  NO longer taking terbinafine.

## 2012-07-11 NOTE — Progress Notes (Signed)
  Subjective:    Patient ID: Wyatt Sandoval, male    DOB: Sep 13, 1960, 52 y.o.   MRN: [id number]  HPI Age when started using tobacco on a daily basis 18. Number of Cigarettes per day 20. Brand smoked kool filtered. Estimated Nicotine Content per Cigarette (mg) 1-1.2.  Estimated Nicotine intake per day 20mg .   Smokes first cigarette 20 minutes after waking. Denies waking to smoke     Most recent quit attempt last fall - successful for ~1 week with use of patches and bupropion (could NOT tolerate dysphoria) Longest time ever been tobacco free 1 week. What Medications (NRT, bupropion, varenicline) used in past includes NRT (patches), bupropion, varenicline.  Rates IMPORTANCE of quitting tobacco on 1-10 scale of 10. Rates READINESS of quitting tobacco on 1-10 scale of 8. Rates CONFIDENCE of quitting tobacco on 1-10 scale of 3-5. Triggers to use tobacco include; coffee, post meals, social situation with friends.   Has used the 1-800 quit line in the past.  NOT interested in using the quit line again.    Denies itching of foot lesion.   Improved per patient from last visit.   Review of Systems     Objective:   Physical Exam   Left foot arch skin looks improved.  Less red with central areas of clearing.      Assessment & Plan:   moderate Nicotine Dependence of 34 years duration in a patient who is fair candidate for success b/c of current level of motivation and past brief success attempts however, his social life includes many smokers.    Initiated nicotine replacement tx 21 patch QAM starting next Monday (3/17- [facility]). Patient counseled on purpose, proper use, and potential adverse effects, including need to rotate sites and possibly removing at bedtime if nightmares occur.   F/U Rx Clinic Visit 3 weeks.   Total time in face-to-face counseling 20 minutes.  Patient seen with  Beatrix Shipper, PharmD candidate. Marland Kitchen

## 2012-07-11 NOTE — Patient Instructions (Addendum)
Good luck with quit date next Monday (March 17).  Follow-up with Dr. Raymondo Band in the clinic in 3 weeks.

## 2012-07-12 NOTE — Progress Notes (Signed)
  Subjective:    Patient ID: Wyatt Sandoval, male    DOB: 1960/09/06, 52 y.o.   MRN: [id number]  HPI Reviewed: Agree with Dr. Macky Lower management and documentation   Review of Systems     Objective:   Physical Exam        Assessment & Plan:

## 2012-07-12 NOTE — Progress Notes (Signed)
  Subjective:    Patient ID: Wyatt Sandoval, male    DOB: 18-Apr-1961, 52 y.o.   MRN: [id number]  HPI    Review of Systems     Objective:   Physical Exam        Assessment & Plan:

## 2012-07-14 ENCOUNTER — Ambulatory Visit (INDEPENDENT_AMBULATORY_CARE_PROVIDER_SITE_OTHER): Payer: Medicare Other | Admitting: Family Medicine

## 2012-07-14 ENCOUNTER — Encounter: Payer: Self-pay | Admitting: Family Medicine

## 2012-07-14 VITALS — BP 124/83 | HR 111 | Temp 98.2°F | Ht 67.0 in | Wt 213.0 lb

## 2012-07-14 DIAGNOSIS — F17200 Nicotine dependence, unspecified, uncomplicated: Secondary | ICD-10-CM

## 2012-07-14 DIAGNOSIS — L301 Dyshidrosis [pompholyx]: Secondary | ICD-10-CM

## 2012-07-14 NOTE — Patient Instructions (Signed)
It was nice seeing you again today. I am glad your foot is improving. Return on your scheduled date for your last hep B shot. Please keep me updated with your [facility] appointments and hope you do well on the new medications.  The cream for your eczema is called Cetaphil, place a generous amount after your daily shower.     Eczema Atopic dermatitis, or eczema, is an inherited type of sensitive skin. Often people with eczema have a family history of allergies, asthma, or hay fever. It causes a red itchy rash and dry scaly skin. The itchiness may occur before the skin rash and may be very intense. It is not contagious. Eczema is generally worse during the cooler winter months and often improves with the warmth of summer. Eczema usually starts showing signs in infancy. Some children outgrow eczema, but it may last through adulthood. Flare-ups may be caused by:  Eating something or contact with something you are sensitive or allergic to.  Stress. DIAGNOSIS  The diagnosis of eczema is usually based upon symptoms and medical history. TREATMENT    Eczema cannot be cured, but symptoms usually can be controlled with treatment or avoidance of allergens (things to which you are sensitive or allergic to).  Controlling the itching and scratching.  Use over-the-counter antihistamines as directed for itching. It is especially useful at night when the itching tends to be worse.  Use over-the-counter steroid creams as directed for itching.  Scratching makes the rash and itching worse and may cause impetigo (a skin infection) if fingernails are contaminated (dirty).  Keeping the skin well moisturized with creams every day. This will seal in moisture and help prevent dryness. Lotions containing alcohol and water can dry the skin and are not recommended.  Limiting exposure to allergens.  Recognizing situations that cause stress.  Developing a plan to manage stress. HOME CARE INSTRUCTIONS   Take  prescription and over-the-counter medicines as directed by your caregiver.  Do not use anything on the skin without checking with your caregiver.  Keep baths or showers short (5 minutes) in warm (not hot) water. Use mild cleansers for bathing. You may add non-perfumed bath oil to the bath water. It is best to avoid soap and bubble bath.  Immediately after a bath or shower, when the skin is still damp, apply a moisturizing ointment to the entire body. This ointment should be a petroleum ointment. This will seal in moisture and help prevent dryness. The thicker the ointment the better. These should be unscented.  Keep fingernails cut short and wash hands often. If your child has eczema, it may be necessary to put soft gloves or mittens on your child at night.  Dress in clothes made of cotton or cotton blends. Dress lightly, as heat increases itching.  Avoid foods that may cause flare-ups. Common foods include cow's milk, peanut butter, eggs and wheat.  Keep a child with eczema away from anyone with fever blisters. The virus that causes fever blisters (herpes simplex) can cause a serious skin infection in children with eczema. SEEK MEDICAL CARE IF:   Itching interferes with sleep.  The rash gets worse or is not better within one week following treatment.  The rash looks infected (pus or soft yellow scabs).  You or your child has an oral temperature above 102 F (38.9 C).  Your baby is older than 3 months with a rectal temperature of 100.5 F (38.1 C) or higher for more than 1 day.  The rash flares  up after contact with someone who has fever blisters. SEEK IMMEDIATE MEDICAL CARE IF:   Your baby is older than 3 months with a rectal temperature of 102 F (38.9 C) or higher.  Your baby is older than 3 months or younger with a rectal temperature of 100.4 F (38 C) or higher. Document Released: 04/17/2000 Document Revised: 07/13/2011 Document Reviewed: 02/20/2009 [facility] Patient  Information 2013 [place], [place].

## 2012-07-14 NOTE — Progress Notes (Signed)
Subjective:     Patient ID: Wyatt Sandoval, male   DOB: Sep 08, 1960, 52 y.o.   MRN: [id number]  HPI Eczema: Patient here for f/u on his eczema on his left foot. It is healing well. He reports no problems and he feels it is getting better with the steroid cream that he has been applying twice a day. He reports no bleeding or break in skin. No drainage or sign of infection. He reports he had issues with eczema when he was a child.  Smoking cessation: We reviewed he his quit date, which is coming up on Monday. He has good confidence in himself that he will be able to do it this time and is a little apprehension about Monday. He reports he did it before and he can doit again.  Vaccination: He received his 2:3 Hep B vac yesterday.He is aware he needs to return for his 3rd shot and has scheduled that appointment.   Hep C: We have discussed his follow-up with [facility] and he reports he was staged as "3-4". He states they told him there was some scarring on his liver. However, they did state they are going to initiate starting April, with a "new" medication to the market. Otherwise he has been feeling good and has no complaints.   Review of Systems Denies fever, foot pain or edema.    Objective:   Physical Exam BP 124/83  Pulse 111  Temp(Src) 98.2 F (36.8 C) (Oral)  Ht 5\' 7"  (1.702 m)  Wt 213 lb (96.616 kg)  BMI 33.35 kg/m2  Gen: Patient looks well and appears in a good mood.  EXT: Left foot is healing nicely. No erythema, drainage or signs of infection. Not TTP. The borders of the area have decreased greatly. +2/4 pulses PT.

## 2012-07-14 NOTE — Assessment & Plan Note (Signed)
-   Area is much improved from original presentation. - Discussed chronic steroid use, patient understands - Small area appeared on his arm as well, he used same cream and resolved.  - Micro is still pending at this time.  - Consider Lichen planus as a differential if it does not resolve. (small association with Hep C patients)  - Encouraged Cetaphil use after showers to replace chronic topical steroid use.  F/u prn

## 2012-07-18 ENCOUNTER — Telehealth: Payer: Self-pay | Admitting: Family Medicine

## 2012-07-18 NOTE — Telephone Encounter (Signed)
Pt is asking for Chantix - had talked with Dr Raymondo Band and thought he was going to call some in for him.  [name] - [employer]

## 2012-07-20 NOTE — Telephone Encounter (Signed)
Dr. Raymondo Band,  I received a call, through staff, that Wyatt Sandoval was under the impression you were going to prescribe him chantix. I could not find mention of it in your note, nor a prescription for it sent out. Could you address this, if you need me to do anything let me know.

## 2012-07-22 ENCOUNTER — Telehealth: Payer: Self-pay | Admitting: Pharmacist

## 2012-07-22 MED ORDER — VARENICLINE TARTRATE 0.5 MG X 11 & 1 MG X 42 PO MISC: ORAL | Status: DC

## 2012-07-22 NOTE — Telephone Encounter (Signed)
No answer - left message RE tobacco cessation  Patch monotherapy was plan at last visit.    Asked patient to call back and leave update message.

## 2012-07-22 NOTE — Telephone Encounter (Signed)
Patient requested Chantix to be used in place of patches.  Plan to have hime start these in place of patches.   New Rx for Chantix ordered with starting month pack via phone order.   Follow up in clinic in 2-3 weeks.    Patient agreed with plan.

## 2012-11-10 ENCOUNTER — Other Ambulatory Visit: Payer: Self-pay

## 2012-12-05 ENCOUNTER — Ambulatory Visit (INDEPENDENT_AMBULATORY_CARE_PROVIDER_SITE_OTHER): Payer: Medicare Other | Admitting: Family Medicine

## 2012-12-05 ENCOUNTER — Encounter: Payer: Self-pay | Admitting: Family Medicine

## 2012-12-05 ENCOUNTER — Encounter: Payer: Self-pay | Admitting: Internal Medicine

## 2012-12-05 VITALS — BP 118/70 | HR 109 | Temp 99.2°F | Ht 67.0 in | Wt 205.0 lb

## 2012-12-05 DIAGNOSIS — Z299 Encounter for prophylactic measures, unspecified: Secondary | ICD-10-CM | POA: Insufficient documentation

## 2012-12-05 DIAGNOSIS — B1920 Unspecified viral hepatitis C without hepatic coma: Secondary | ICD-10-CM

## 2012-12-05 DIAGNOSIS — IMO0002 Reserved for concepts with insufficient information to code with codable children: Secondary | ICD-10-CM

## 2012-12-05 DIAGNOSIS — E119 Type 2 diabetes mellitus without complications: Secondary | ICD-10-CM

## 2012-12-05 DIAGNOSIS — Z23 Encounter for immunization: Secondary | ICD-10-CM

## 2012-12-05 MED ORDER — TRIAMCINOLONE ACETONIDE 0.1 % EX OINT: TOPICAL_OINTMENT | Freq: Two times a day (BID) | CUTANEOUS | Status: DC

## 2012-12-05 NOTE — Assessment & Plan Note (Signed)
-   hep B #3 today

## 2012-12-05 NOTE — Assessment & Plan Note (Signed)
-   Patient had a mild response to steroid cream, but has been out of medications for 2 months.  - Eczema present bilateral knees, left medial and lateral foot, right eye - Refilled steroid cream today and Derm referral ordered

## 2012-12-05 NOTE — Patient Instructions (Signed)
Colonoscopy  A colonoscopy is an exam to evaluate your entire colon. In this exam, your colon is cleansed. A long fiberoptic tube is inserted through your rectum and into your colon. The fiberoptic scope (endoscope) is a long bundle of enclosed and very flexible fibers. These fibers transmit light to the area examined and send images from that area to your caregiver. Discomfort is usually minimal. You may be given a drug to help you sleep (sedative) during or prior to the procedure. This exam helps to detect lumps (tumors), polyps, inflammation, and areas of bleeding. Your caregiver may also take a small piece of tissue (biopsy) that will be examined under a microscope.  LET YOUR CAREGIVER KNOW ABOUT:   · Allergies to food or medicine.  · Medicines taken, including vitamins, herbs, eyedrops, over-the-counter medicines, and creams.  · Use of steroids (by mouth or creams).  · Previous problems with anesthetics or numbing medicines.  · History of bleeding problems or blood clots.  · Previous surgery.  · Other health problems, including diabetes and kidney problems.  · Possibility of pregnancy, if this applies.  BEFORE THE PROCEDURE   · A clear liquid diet may be required for 2 days before the exam.  · Ask your caregiver about changing or stopping your regular medications.  · Liquid injections (enemas) or laxatives may be required.  · A large amount of electrolyte solution may be given to you to drink over a short period of time. This solution is used to clean out your colon.  · You should be present 60 minutes prior to your procedure or as directed by your caregiver.  AFTER THE PROCEDURE   · If you received a sedative or pain relieving medication, you will need to arrange for someone to drive you home.  · Occasionally, there is a little blood passed with the first bowel movement. Do not be concerned.  FINDING OUT THE RESULTS OF YOUR TEST  Not all test results are available during your visit. If your test results are  not back during the visit, make an appointment with your caregiver to find out the results. Do not assume everything is normal if you have not heard from your caregiver or the medical facility. It is important for you to follow up on all of your test results.  HOME CARE INSTRUCTIONS   · It is not unusual to pass moderate amounts of gas and experience mild abdominal cramping following the procedure. This is due to air being used to inflate your colon during the exam. Walking or a warm pack on your belly (abdomen) may help.  · You may resume all normal meals and activities after sedatives and medicines have worn off.  · Only take over-the-counter or prescription medicines for pain, discomfort, or fever as directed by your caregiver. Do not use aspirin or blood thinners if a biopsy was taken. Consult your caregiver for medicine usage if biopsies were taken.  SEEK IMMEDIATE MEDICAL CARE IF:   · You have a fever.  · You pass large blood clots or fill a toilet with blood following the procedure. This may also occur 10 to 14 days following the procedure. This is more likely if a biopsy was taken.  · You develop abdominal pain that keeps getting worse and cannot be relieved with medicine.  Document Released: 04/17/2000 Document Revised: 07/13/2011 Document Reviewed: 12/01/2007  Exit[employer]® Patient Information ©2014 [employer].

## 2012-12-05 NOTE — Progress Notes (Signed)
Subjective:     Patient ID: Wyatt Sandoval, male   DOB: [birth year], 52 y.o.   MRN: [id number]  HPI diabetic follow up:  Per the patient his sugars have been running between 120-125 at home. Repeat A1c today is 6.7  chronic back pain which was rated at an 8/10. He is seeing a chronic pain specialist and being treated with oxycodone, per patient, which seems to be helping. He experiences some tingling in his legs which he has been told is originating from his back.  Rash: His other concern today is an un-resolving rash on his left foot and now his knees bilaterally. The rash on his foot has been there for several months and continues to be pruritic. He has used hydrocortisone cream with some success in the past. His right eye also has been very itchy for the past 2 weeks and he says eyedrops make it worse.  Hep C:  He also states he is due for his final HepB vaccination, and says he has had trouble getting into contact with [facility] doctors that were treating him for [facility] about a new medication he may be eligible for.   Preventive health: Patient admits to smoking 1ppd. Finally, he is interested in setting up an appointment for a colonoscopy because he is turning 52 in October.   Review of Systems He admits to some intermittent headaches and some mild dizziness when he stands up quickly on seldom occasions. He denies visual changes. He denies chest pain, SOB, abdominal or flank pain, N/V/D/C, hematuria, hematochezia, dysuria, or melena. He denies any other rashes besides those described and also denies any past history of rashes on the soles of his feet or anywhere else.     Negative except for what is described in the HPI.  Objective:   Physical Exam  Constitutional: He is oriented to person, place, and time.  Eyes: EOM are normal. Pupils are equal, round, and reactive to light.  Right medial sclera is slightly injected.   Cardiovascular: Normal rate, regular rhythm and normal heart  sounds.  Exam reveals no gallop and no friction rub.   No murmur heard. Pulmonary/Chest: Effort normal and breath sounds normal. He has no wheezes. He has no rales.  Abdominal: Soft. Bowel sounds are normal. There is no tenderness.  Lymphadenopathy:    He has no cervical adenopathy.  Neurological: He is alert and oriented to person, place, and time.  Skin: Skin is warm and dry. Rash noted.  Rash present right eye medial canthus,  Superior/anterior knee (quarter size) bilaterally, medial arch of left foot and left lateral foot.  BP 118/70  Pulse 109  Temp(Src) 99.2 F (37.3 C) (Oral)  Ht 5\' 7"  (1.702 m)  Wt 205 lb (92.987 kg)  BMI 32.1 kg/m2     Assessment:     1. Dyshidrotic Eczema 2. Diabetes Mellitus, type II, uncomplicated 3. Hepatitis B prevention.  4. Hepatitis C, chronic 5. Preventative medicine, colonoscopy.     Plan:     1. Dyshidrotic Eczema: refill prescription for triamcinolone cream to use 2x per day applied to clean skin of the affected areas. Continue to moisturize skin liberally. If rash becomes more pruritic, begins to become painful, or spread, patient instructed to contact the office.  2. Diabetes Mellitus, type II, uncomplicated: A1C was 6.7 today, so continue to take metformin and monitor blood glucose as indicated.  3. Hepatitis B prevention.: 3rd shot in series of 3 given today. Continue to monitor condition.  4. Hepatitis C, chronic: Sending a letter to [facility] to inquire about HepC medication he was told he may be eligible for.  5. Preventative medicine, colonoscopy: Patient referral made and literature provided to patient. Instructed to call the GI office in a few weeks to make an appointment.

## 2012-12-05 NOTE — Assessment & Plan Note (Addendum)
-   A1c 6.7 today. Patient has made dietary changes and feels good.  - F./U: 6 mos

## 2012-12-20 ENCOUNTER — Ambulatory Visit: Payer: Medicare Other | Admitting: Family Medicine

## 2012-12-25 ENCOUNTER — Encounter: Payer: Self-pay | Admitting: Family Medicine

## 2013-01-03 ENCOUNTER — Telehealth: Payer: Self-pay | Admitting: Family Medicine

## 2013-01-03 NOTE — Telephone Encounter (Signed)
Patient calls, very distressed. He received a letter from Dr. Claiborne Billings on Friday stating that he has stage "F4" cirrhosis of the liver. He is wanting an appt with Dr. Claiborne Billings , and she has no schedule for the entire week. Offered patient a appt w/ another MD but he states he is only comfortable w/ Dr. Claiborne Billings and that she knows his issues better. Patient would like to speak w/ Dr. Claiborne Billings, if possible about this recent diagnosis.

## 2013-01-04 ENCOUNTER — Telehealth: Payer: Self-pay | Admitting: Family Medicine

## 2013-01-04 DIAGNOSIS — K7460 Unspecified cirrhosis of liver: Secondary | ICD-10-CM

## 2013-01-04 DIAGNOSIS — B1920 Unspecified viral hepatitis C without hepatic coma: Secondary | ICD-10-CM

## 2013-01-04 NOTE — Telephone Encounter (Signed)
Informed patient of test results from [facility] that we received copies of recently. He was distressed over the recent letter I sent out to him about recommended screening procedures for someone with stage 4 cirrhosis. He acted as if this was new news to him, although these test were completed in February in [place]. We discussed the test and he is willing to have his liver ultrasound and he will call his GI doctor to attempt to have screening upper EGD with his colonoscopy that is scheduled in the next few weeks.

## 2013-01-05 ENCOUNTER — Telehealth: Payer: Self-pay | Admitting: *Deleted

## 2013-01-05 NOTE — Telephone Encounter (Signed)
Left message for patient to return call. Please tell him he has an appointment at [facility] for his [place] on 01/09/13 at 7:45 am, NPO after midnight the night before. If he needs to reschedule he will need to call [phone].Radonna Bracher, Rodena Medin

## 2013-01-09 ENCOUNTER — Telehealth: Payer: Self-pay | Admitting: Family Medicine

## 2013-01-09 ENCOUNTER — Ambulatory Visit
Admission: RE | Admit: 2013-01-09 | Discharge: 2013-01-09 | Disposition: A | Discharge status: Home / Self Care | Payer: Medicare Other | Source: Ambulatory Visit | Attending: Family Medicine | Admitting: Family Medicine

## 2013-01-09 DIAGNOSIS — I10 Essential (primary) hypertension: Secondary | ICD-10-CM

## 2013-01-09 DIAGNOSIS — K7460 Unspecified cirrhosis of liver: Secondary | ICD-10-CM

## 2013-01-09 DIAGNOSIS — B1920 Unspecified viral hepatitis C without hepatic coma: Secondary | ICD-10-CM

## 2013-01-09 NOTE — Telephone Encounter (Signed)
Related message,patient voiced understanding.Patient states he needs an order for endoscopy per [facility],he is schedule for the colonoscopy but there needing an order for the endo.thank you. Alajah Witman, Virgel Bouquet

## 2013-01-09 NOTE — Telephone Encounter (Signed)
Please call Wyatt Sandoval and make him aware his liver ultrasound showed no signs of cancer. We will likely repeat this screening yearly.  Thanks

## 2013-01-10 NOTE — Telephone Encounter (Signed)
Another referral to GI is required in order for Wyatt Sandoval to have endoscopy completed (as well as his colonoscopy already scheduled) this October. Wyatt Sandoval has Hepatitis C and it is recommended he be evaluated  by endoscopy for complications from chronic Hep C including esophageal tears. I have place that referral today.

## 2013-01-10 NOTE — Addendum Note (Signed)
Addended by: Felix Pacini A on: 01/10/2013 07:17 PM   Modules accepted: Orders

## 2013-02-01 ENCOUNTER — Ambulatory Visit (AMBULATORY_SURGERY_CENTER): Payer: Self-pay | Admitting: *Deleted

## 2013-02-01 ENCOUNTER — Encounter: Payer: Self-pay | Admitting: Internal Medicine

## 2013-02-01 VITALS — Ht 67.0 in | Wt 203.4 lb

## 2013-02-01 DIAGNOSIS — Z1211 Encounter for screening for malignant neoplasm of colon: Secondary | ICD-10-CM

## 2013-02-01 MED ORDER — MOVIPREP 100 G PO SOLR: 1.0000 | Freq: Once | ORAL | Status: DC

## 2013-02-01 NOTE — Progress Notes (Signed)
No egg or soy allergy. No anesthesia problems.  

## 2013-02-15 ENCOUNTER — Encounter: Payer: Self-pay | Admitting: Internal Medicine

## 2013-02-15 ENCOUNTER — Ambulatory Visit (AMBULATORY_SURGERY_CENTER): Payer: Medicare Other | Admitting: Internal Medicine

## 2013-02-15 VITALS — BP 119/76 | HR 87 | Temp 96.2°F | Resp 19 | Ht 67.0 in | Wt 203.0 lb

## 2013-02-15 DIAGNOSIS — Z1211 Encounter for screening for malignant neoplasm of colon: Secondary | ICD-10-CM

## 2013-02-15 LAB — GLUCOSE, CAPILLARY
Glucose-Capillary: 222 mg/dL — ABNORMAL HIGH (ref 70–99)
Glucose-Capillary: 121 mg/dL — ABNORMAL HIGH (ref 70–99)

## 2013-02-15 MED ORDER — SODIUM CHLORIDE 0.9 % IV SOLN: 500.0000 mL | INTRAVENOUS | Status: DC

## 2013-02-15 NOTE — Progress Notes (Signed)
No complaint noted in the recovery room. Maw   

## 2013-02-15 NOTE — Progress Notes (Signed)
Procedure ends, to recovery, report given and VSS. 

## 2013-02-15 NOTE — Op Note (Signed)
[facility] [address] [place], [address]   COLONOSCOPY PROCEDURE REPORT  PATIENT: Wyatt Sandoval, Wyatt Sandoval  MR#: [id number] BIRTHDATE: 1960-09-09 , 52  yrs. old GENDER: Male ENDOSCOPIST: Beverley Fiedler, MD PROCEDURE DATE:  02/15/2013 PROCEDURE:   Colonoscopy, screening First Screening Colonoscopy - Avg.  risk and is 50 yrs.  old or older Yes.  Prior Negative Screening - Now for repeat screening. N/A  History of Adenoma - Now for follow-up colonoscopy & has been > or = to 3 yrs.  N/A  Polyps Removed Today? No.  Recommend repeat exam, <10 yrs? No. ASA CLASS:   Class II INDICATIONS:average risk screening and first colonoscopy. MEDICATIONS: MAC sedation, administered by CRNA and Propofol (Diprivan) 230 mg IV  DESCRIPTION OF PROCEDURE:   After the risks benefits and alternatives of the procedure were thoroughly explained, informed consent was obtained.  A digital rectal exam revealed no rectal mass.   The LB CF-H180AL Loaner [id number]  endoscope was introduced through the anus and advanced to the cecum, which was identified by both the appendix and ileocecal valve. No adverse events experienced.   The quality of the prep was good, using MoviPrep The instrument was then slowly withdrawn as the colon was fully examined.    COLON FINDINGS: A normal appearing cecum, ileocecal valve, and appendiceal orifice were identified.  The ascending, hepatic flexure, transverse, splenic flexure, descending, sigmoid colon and rectum appeared unremarkable.  No polyps or cancers were seen. Retroflexed views revealed no abnormalities. The time to cecum=2 minutes 34 seconds.  Withdrawal time=10 minutes 18 seconds.  The scope was withdrawn and the procedure completed.  COMPLICATIONS: There were no complications.  ENDOSCOPIC IMPRESSION: Normal colon  RECOMMENDATIONS: You should continue to follow colorectal cancer screening guidelines for "routine risk" patients with a repeat  colonoscopy in 10 years. There is no need for FOBT (stool) testing for at least 5 years.   eSigned:  Beverley Fiedler, MD 02/15/2013 9:21 AM   cc: The Patient; Dr. Felix Pacini, D.O.

## 2013-02-15 NOTE — Patient Instructions (Signed)
YOU HAD AN ENDOSCOPIC PROCEDURE TODAY AT THE [facility]: Refer to the procedure report that was given to you for any specific questions about what was found during the examination.  If the procedure report does not answer your questions, please call your gastroenterologist to clarify.  If you requested that your care partner not be given the details of your procedure findings, then the procedure report has been included in a sealed envelope for you to review at your convenience later.  YOU SHOULD EXPECT: Some feelings of bloating in the abdomen. Passage of more gas than usual.  Walking can help get rid of the air that was put into your GI tract during the procedure and reduce the bloating. If you had a lower endoscopy (such as a colonoscopy or flexible sigmoidoscopy) you may notice spotting of blood in your stool or on the toilet paper. If you underwent a bowel prep for your procedure, then you may not have a normal bowel movement for a few days.  DIET: Your first meal following the procedure should be a light meal and then it is ok to progress to your normal diet.  A half-sandwich or bowl of soup is an example of a good first meal.  Heavy or fried foods are harder to digest and may make you feel nauseous or bloated.  Likewise meals heavy in dairy and vegetables can cause extra gas to form and this can also increase the bloating.  Drink plenty of fluids but you should avoid alcoholic beverages for 24 hours.  ACTIVITY: Your care partner should take you home directly after the procedure.  You should plan to take it easy, moving slowly for the rest of the day.  You can resume normal activity the day after the procedure however you should NOT DRIVE or use heavy machinery for 24 hours (because of the sedation medicines used during the test).    SYMPTOMS TO REPORT IMMEDIATELY: A gastroenterologist can be reached at any hour.  During normal business hours, 8:30 AM to 5:00 PM Monday through Friday,  call [phone].  After hours and on weekends, please call the GI answering service at [phone] who will take a message and have the physician on call contact you.   Following lower endoscopy (colonoscopy or flexible sigmoidoscopy):  Excessive amounts of blood in the stool  Significant tenderness or worsening of abdominal pains  Swelling of the abdomen that is new, acute  Fever of 100F or higher   FOLLOW UP: If any biopsies were taken you will be contacted by phone or by letter within the next 1-3 weeks.  Call your gastroenterologist if you have not heard about the biopsies in 3 weeks.  Our staff will call the home number listed on your records the next business day following your procedure to check on you and address any questions or concerns that you may have at that time regarding the information given to you following your procedure. This is a courtesy call and so if there is no answer at the home number and we have not heard from you through the emergency physician on call, we will assume that you have returned to your regular daily activities without incident.  SIGNATURES/CONFIDENTIALITY: You and/or your care partner have signed paperwork which will be entered into your electronic medical record.  These signatures attest to the fact that that the information above on your After Visit Summary has been reviewed and is understood.  Full responsibility of the confidentiality of   this discharge information lies with you and/or your care-partner.   Normal colon today.  Repeat colonoscopy in 10 years. You may resume your current medications today. Please call if any questions or concerns. Dr. Lauro Franklin nurse on the 3rd floor will call you tomorrow to make you an appointment.

## 2013-02-16 ENCOUNTER — Telehealth: Payer: Self-pay | Admitting: *Deleted

## 2013-02-16 ENCOUNTER — Telehealth: Payer: Self-pay | Admitting: Gastroenterology

## 2013-02-16 NOTE — Telephone Encounter (Signed)
Sent referral to [facility] liver care for Chronic active Hep C. Pt will be contacted by then to set up appointment, they will also notify us of pts appointment. [phone]

## 2013-02-16 NOTE — Telephone Encounter (Signed)
  Follow up Call-  Call back number 02/15/2013  Post procedure Call Back phone  # [phone]  Permission to leave phone message Yes     Patient questions:  Do you have a fever, pain , or abdominal swelling? no Pain Score  0 *  Have you tolerated food without any problems? Yes   Have you been able to return to your normal activities? yes  Do you have any questions about your discharge instructions: Diet   no Medications  no Follow up visit  no  Do you have questions or concerns about your Care? no  Actions: * If pain score is 4 or above: No action needed, pain <4.  Pt is c/o a cold, advised pt that if he has fever of 100.5 we need to know and to call us back-adm

## 2013-03-07 ENCOUNTER — Encounter: Payer: Self-pay | Admitting: Internal Medicine

## 2013-03-13 ENCOUNTER — Ambulatory Visit (INDEPENDENT_AMBULATORY_CARE_PROVIDER_SITE_OTHER): Payer: Medicare Other | Admitting: Internal Medicine

## 2013-03-13 ENCOUNTER — Encounter: Payer: Self-pay | Admitting: Internal Medicine

## 2013-03-13 ENCOUNTER — Other Ambulatory Visit (INDEPENDENT_AMBULATORY_CARE_PROVIDER_SITE_OTHER): Payer: Medicare Other

## 2013-03-13 VITALS — BP 116/68 | HR 92 | Ht 67.0 in | Wt 208.0 lb

## 2013-03-13 DIAGNOSIS — B1920 Unspecified viral hepatitis C without hepatic coma: Secondary | ICD-10-CM

## 2013-03-13 DIAGNOSIS — K219 Gastro-esophageal reflux disease without esophagitis: Secondary | ICD-10-CM

## 2013-03-13 DIAGNOSIS — R1013 Epigastric pain: Secondary | ICD-10-CM

## 2013-03-13 DIAGNOSIS — K824 Cholesterolosis of gallbladder: Secondary | ICD-10-CM

## 2013-03-13 HISTORY — DX: Cholesterolosis of gallbladder: K82.4

## 2013-03-13 LAB — COMPREHENSIVE METABOLIC PANEL
Alkaline Phosphatase: 55 U/L (ref 39–117)
BUN: 13 mg/dL (ref 6–23)
CO2: 26 mEq/L (ref 19–32)
Creatinine, Ser: 1.1 mg/dL (ref 0.4–1.5)
GFR: 89.21 mL/min (ref >60.00)
Glucose, Bld: 150 mg/dL — ABNORMAL HIGH (ref 70–99)
Total Bilirubin: 0.3 mg/dL (ref 0.3–1.2)

## 2013-03-13 LAB — PROTIME-INR: INR: 1 ratio (ref 0.8–1.0)

## 2013-03-13 LAB — CBC
HCT: 44.6 % (ref 39.0–52.0)
Hemoglobin: 14.7 g/dL (ref 13.0–17.0)
Platelets: 239 10*3/uL (ref 150.0–400.0)
RBC: 5.49 Mil/uL (ref 4.22–5.81)
RDW: 15.5 % — ABNORMAL HIGH (ref 11.5–14.6)
WBC: 5.5 10*3/uL (ref 4.5–10.5)

## 2013-03-13 MED ORDER — OMEPRAZOLE 40 MG PO CPDR: 40.0000 mg | DELAYED_RELEASE_CAPSULE | Freq: Every day | ORAL | Status: DC

## 2013-03-13 NOTE — Patient Instructions (Signed)
You have been scheduled for an endoscopy with propofol. Please follow written instructions given to you at your visit today. If you use inhalers (even only as needed), please bring them with you on the day of your procedure. Your physician has requested that you go to www.startemmi.com and enter the access code given to you at your visit today. This web site gives a general overview about your procedure. However, you should still follow specific instructions given to you by our office regarding your preparation for the procedure.  You are being referred to [facility] liver care. They will contact you to set up your appointment.  Your physician has requested that you go to the basement for  lab work before leaving today.  You have been given a flu shot today  We have sent the following medications to your pharmacy for you to pick up at your convenience: Omeprazole 40 mg daily

## 2013-03-13 NOTE — Progress Notes (Signed)
Patient ID: Wyatt Sandoval, male   DOB: 09-20-1960, 52 y.o.   MRN: [id number] HPI: Wyatt Sandoval is a 52 year old male known to me from direct screening colonoscopy performed last month, also with a past medical history of hepatitis C, hypertension, hyperlipidemia, diabetes, and chronic back pain who is seen to evaluate epigastric pain and nausea with vomiting. He reports over the last several months that he has had epigastric burning pain which is worse with eating. He also reports heartburn and moderate rash. He recalls on 2 occasions he had emesis which was slightly bloody. He has some mild nausea after eating as well. No dysphagia or odynophagia. Bowel habits have remained normal for him, possibly even more normal after recent colonoscopy. He denies melena or rectal bleeding. Of note his colonoscopy which was performed on 02/15/2013 was normal.  He reports history of active hepatitis C. He apparently was seen at [facility] several times in treatment was considered. He has had trouble with followup and reports his Wyatt Sandoval several times and they are not returning his calls. He would like to seek care elsewhere for hepatitis C. He reports very rare alcohol use, at this point less than once per month. He does have a history of alcohol use which was previously heavier than current.  He denies active or recent use of illicit drugs. He denies a family history of liver disease but reports his brother died at age 36 of pancreatic cancer.  Past Medical History  Diagnosis Date  . Diabetes mellitus   . 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 Hyperlipidemia   . Hypertension   . Hepatitis C     Past Surgical History  Procedure Laterality Date  . Spinal fusion      Current Outpatient Prescriptions  Medication Sig Dispense Refill  . amitriptyline (ELAVIL) 150 MG tablet Take 1 tablet (150 mg total) by mouth at  bedtime.  90 tablet  3  . aspirin 81 MG tablet Take 1 tablet (81 mg total) by mouth daily.  30 tablet  11  . Blood Glucose Monitoring Suppl (ONE TOUCH ULTRA 2) W/DEVICE KIT by Does not apply route.       . enalapril (VASOTEC) 5 MG tablet Take 1 tablet (5 mg total) by mouth daily.  90 tablet  6  . fish oil-omega-3 fatty acids 1000 MG capsule Take 2 g by mouth daily.        Marland Kitchen glucose blood (ONE TOUCH ULTRA TEST) test strip Use as instructed  35 each  3  . ketoconazole (NIZORAL) 2 % cream       . metFORMIN (GLUCOPHAGE) 1000 MG tablet Take 1 tablet (1,000 mg total) by mouth 2 (two) times daily with a meal.  180 tablet  3  . nicotine (NICODERM CQ - DOSED IN MG/24 HOURS) 21 mg/24hr patch Place 1 patch onto the skin daily.      Marland Kitchen oxyCODONE (OXYCONTIN) 15 MG TB12 Take 1 tablet (15 mg total) by mouth every 12 (twelve) hours.  60 tablet    . simvastatin (ZOCOR) 40 MG tablet TAKE ONE TABLET BY MOUTH IN THE EVENING  30 tablet  0  . triamcinolone ointment (KENALOG) 0.1 % Apply topically 2 (two) times daily.  80 g  0  . varenicline (CHANTIX STARTING MONTH PAK) 0.5 MG X 11 & 1 MG X 42 tablet Take one 0.5 mg  tablet by mouth once daily for 3 days, then increase to one 0.5 mg tablet twice daily for 4 days, then increase to one 1 mg tablet twice daily.  53 tablet  0  . omeprazole (PRILOSEC) 40 MG capsule Take 1 capsule (40 mg total) by mouth daily.  90 capsule  3   No current facility-administered medications for this visit.    Allergies  Allergen Reactions  . Bupropion Other (See Comments)    Memory and thinking impairment - dysphoria    Family History  Problem Relation Age of Onset  . Colon cancer Neg Hx   . Esophageal cancer Neg Hx   . Liver cancer Brother     pancreatic    History  Substance Use Topics  . Smoking status: Former Smoker -- 1.00 packs/day    Types: Cigarettes    Quit date: 02/27/2013  . Smokeless tobacco: Never Used     Comment: pt has nicotine patch  . Alcohol Use: Yes      Comment: once or twice a year    ROS: As per history of present illness, otherwise negative  BP 116/68  Pulse 92  Ht 5\' 7"  (1.702 m)  Wt 208 lb (94.348 kg)  BMI 32.57 kg/m2 Constitutional: Well-developed and well-nourished. No distress. HEENT: Normocephalic and atraumatic. Oropharynx is clear and moist. No oropharyngeal exudate. Conjunctivae are normal.  No scleral icterus. Neck: Neck supple. Trachea midline. Cardiovascular: Normal rate, regular rhythm and intact distal pulses. No M/R/G Pulmonary/chest: Effort normal and breath sounds normal. No wheezing, rales or rhonchi. Abdominal: Soft, mild epigastric tenderness without rebound or, nondistended. Bowel sounds active throughout.  Extremities: no clubbing, cyanosis, or edema Neurological: Alert and oriented to person place and time. Skin: Skin is warm and dry. No rashes noted. Psychiatric: Normal mood and affect. Behavior is normal.  RELEVANT LABS AND IMAGING: CBC    Component Value Date/Time   WBC 8.6 10/13/2011 1033   RBC 5.84* 10/13/2011 1033   HGB 15.7 10/13/2011 1033   HCT 46.2 10/13/2011 1033   PLT 272 10/13/2011 1033   MCV 79.1 10/13/2011 1033   MCH 26.9 10/13/2011 1033   MCHC 34.0 10/13/2011 1033   RDW 16.2* 10/13/2011 1033    CMP     Component Value Date/Time   NA 137 03/23/2012 1106   K 4.5 03/23/2012 1106   CL 101 03/23/2012 1106   CO2 25 03/23/2012 1106   GLUCOSE 169* 03/23/2012 1106   BUN 18 03/23/2012 1106   CREATININE 1.04 03/23/2012 1106   CREATININE 1.07 01/21/2007 2042   CALCIUM 9.9 03/23/2012 1106   PROT 6.8 10/28/2011 1019   PROT 6.8 10/28/2011 1019   ALBUMIN 4.3 10/28/2011 1019   ALBUMIN 4.3 10/28/2011 1019   AST 43* 10/28/2011 1019   AST 43* 10/28/2011 1019   ALT 62* 10/28/2011 1019   ALT 60* 10/28/2011 1019   ALKPHOS 64 10/28/2011 1019   ALKPHOS 65 10/28/2011 1019   BILITOT 0.4 10/28/2011 1019   BILITOT 0.4 10/28/2011 1019   LIMITED ABDOMINAL ULTRASOUND - RIGHT UPPER QUADRANT -- September 2014    Comparison:  None.   Findings:   Gallbladder:  Moderately distended.  4 mm polyp.  No shadowing stones or wall thickening.  No pericholecystic fluid.   Common bile duct:  Normal in caliber with no stones.  The common bile duct measures 4.2 mm.   Liver:  Mildly coarsened echotexture.  The liver is normal overall size.  No mass or focal lesion is seen.  Hepatopetal flow noted in the portal vein.   IMPRESSION: No sonographic evidence of hepatocellular carcinoma. Mildly coarsened liver echotexture, without other findings of cirrhosis.   Small gallbladder polyp.   No other abnormalities.  ASSESSMENT/PLAN: 52 year old male known to me from direct screening colonoscopy performed last month, also with a past medical history of hepatitis C, hypertension, hyperlipidemia, diabetes, and chronic back pain who is seen to evaluate epigastric pain and nausea with vomiting.  1.  Epigastric pain/nausea -- I recommended upper endoscopy for further evaluation of this pain. We discussed the risks and benefits and he is agreeable to proceed. I will also start him on omeprazole 40 mg daily.  2.  Hepatitis C -- I will refer him to [facility] liver clinic for evaluation and possible treatment of chronic active hepatitis C. I will repeat liver enzymes today along with CBC and INR. He recently had a liver ultrasound which showed a mildly coarsened liver architecture but no masses. There were no other definite findings of cirrhosis. Hopefully he will be able to be treated with a new protease and polymerase inhibitors and achieve remission.  I have recommended that he completely avoid alcohol at this time. He voices understanding and does not think this will be difficult for him  3.  Small gallbladder polyp -- incidental seen at ultrasound in September, I recommend repeat gallbladder ultrasound in September 2015 to ensure stability of this polyp.

## 2013-03-15 ENCOUNTER — Encounter: Payer: Medicare Other | Admitting: Family Medicine

## 2013-03-16 ENCOUNTER — Ambulatory Visit (AMBULATORY_SURGERY_CENTER): Payer: Medicare Other | Admitting: Internal Medicine

## 2013-03-16 ENCOUNTER — Encounter: Payer: Self-pay | Admitting: Internal Medicine

## 2013-03-16 VITALS — BP 124/78 | HR 85 | Temp 97.6°F | Resp 14 | Ht 67.0 in | Wt 208.0 lb

## 2013-03-16 DIAGNOSIS — K2970 Gastritis, unspecified, without bleeding: Secondary | ICD-10-CM

## 2013-03-16 DIAGNOSIS — D131 Benign neoplasm of stomach: Secondary | ICD-10-CM

## 2013-03-16 DIAGNOSIS — R1013 Epigastric pain: Secondary | ICD-10-CM

## 2013-03-16 DIAGNOSIS — K2990 Gastroduodenitis, unspecified, without bleeding: Secondary | ICD-10-CM

## 2013-03-16 LAB — GLUCOSE, CAPILLARY
Glucose-Capillary: 133 mg/dL — ABNORMAL HIGH (ref 70–99)
Glucose-Capillary: 112 mg/dL — ABNORMAL HIGH (ref 70–99)

## 2013-03-16 MED ORDER — SODIUM CHLORIDE 0.9 % IV SOLN: 500.0000 mL | INTRAVENOUS | Status: DC

## 2013-03-16 NOTE — Patient Instructions (Signed)
Discharge instructions given with verbal understanding. Handout on gastritis . Resume previous medications. YOU HAD AN ENDOSCOPIC PROCEDURE TODAY AT THE [facility]: Refer to the procedure report that was given to you for any specific questions about what was found during the examination.  If the procedure report does not answer your questions, please call your gastroenterologist to clarify.  If you requested that your care partner not be given the details of your procedure findings, then the procedure report has been included in a sealed envelope for you to review at your convenience later.  YOU SHOULD EXPECT: Some feelings of bloating in the abdomen. Passage of more gas than usual.  Walking can help get rid of the air that was put into your GI tract during the procedure and reduce the bloating. If you had a lower endoscopy (such as a colonoscopy or flexible sigmoidoscopy) you may notice spotting of blood in your stool or on the toilet paper. If you underwent a bowel prep for your procedure, then you may not have a normal bowel movement for a few days.  DIET: Your first meal following the procedure should be a light meal and then it is ok to progress to your normal diet.  A half-sandwich or bowl of soup is an example of a good first meal.  Heavy or fried foods are harder to digest and may make you feel nauseous or bloated.  Likewise meals heavy in dairy and vegetables can cause extra gas to form and this can also increase the bloating.  Drink plenty of fluids but you should avoid alcoholic beverages for 24 hours.  ACTIVITY: Your care partner should take you home directly after the procedure.  You should plan to take it easy, moving slowly for the rest of the day.  You can resume normal activity the day after the procedure however you should NOT DRIVE or use heavy machinery for 24 hours (because of the sedation medicines used during the test).    SYMPTOMS TO REPORT IMMEDIATELY: A  gastroenterologist can be reached at any hour.  During normal business hours, 8:30 AM to 5:00 PM Monday through Friday, call [phone].  After hours and on weekends, please call the GI answering service at [phone] who will take a message and have the physician on call contact you.   Following upper endoscopy (EGD)  Vomiting of blood or coffee ground material  New chest pain or pain under the shoulder blades  Painful or persistently difficult swallowing  New shortness of breath  Fever of 100F or higher  Black, tarry-looking stools  FOLLOW UP: If any biopsies were taken you will be contacted by phone or by letter within the next 1-3 weeks.  Call your gastroenterologist if you have not heard about the biopsies in 3 weeks.  Our staff will call the home number listed on your records the next business day following your procedure to check on you and address any questions or concerns that you may have at that time regarding the information given to you following your procedure. This is a courtesy call and so if there is no answer at the home number and we have not heard from you through the emergency physician on call, we will assume that you have returned to your regular daily activities without incident.  SIGNATURES/CONFIDENTIALITY: You and/or your care partner have signed paperwork which will be entered into your electronic medical record.  These signatures attest to the fact that that the information above on   your After Visit Summary has been reviewed and is understood.  Full responsibility of the confidentiality of this discharge information lies with you and/or your care-partner. 

## 2013-03-16 NOTE — Progress Notes (Signed)
Called to room to assist during endoscopic procedure.  Patient ID and intended procedure confirmed with present staff. Received instructions for my participation in the procedure from the performing physician.  

## 2013-03-16 NOTE — Op Note (Signed)
[facility] [address] [place], [address]   ENDOSCOPY PROCEDURE REPORT  PATIENT: Wyatt Sandoval, Wyatt Sandoval  MR#: [id number] BIRTHDATE: 02-25-1961 , 52  yrs. old GENDER: Male ENDOSCOPIST: Beverley Fiedler, MD PROCEDURE DATE:  03/16/2013 PROCEDURE:  EGD w/ biopsy for H.pylori ASA CLASS:     Class II INDICATIONS:  Epigastric pain.   Hematemesis. MEDICATIONS: MAC sedation, administered by CRNA and propofol (Diprivan) 200mg  IV TOPICAL ANESTHETIC: Cetacaine Spray  DESCRIPTION OF PROCEDURE: After the risks benefits and alternatives of the procedure were thoroughly explained, informed consent was obtained.  The LB [id number] endoscope was introduced through the mouth and advanced to the second portion of the duodenum. Without limitations.  The instrument was slowly withdrawn as the mucosa was fully examined.     ESOPHAGUS: The mucosa of the esophagus appeared normal.   No varices seen.  STOMACH: Mild gastritis (inflammation) was found in the gastric body and gastric antrum.  Multiple biopsies were performed using cold forceps.   No varices seen.  DUODENUM: The duodenal mucosa showed no abnormalities in the bulb and second portion of the duodenum. Retroflexed views revealed no abnormalities.     The scope was then withdrawn from the patient and the procedure completed.  COMPLICATIONS: There were no complications.  ENDOSCOPIC IMPRESSION: 1.   The mucosa of the esophagus appeared normal 2.   Mild gastritis (inflammation) was found in the gastric body and gastric antrum; multiple biopsies 3.   The duodenal mucosa showed no abnormalities in the bulb and second portion of the duodenum  RECOMMENDATIONS: 1.  Await pathology results 2.  Follow-up of helicobacter pylori status, treat if indicated  eSigned:  Beverley Fiedler, MD 03/16/2013 8:17 AM   CC:The Patient; Felix Pacini, DO

## 2013-03-16 NOTE — Progress Notes (Signed)
Patient did not experience any of the following events: a burn prior to discharge; a fall within the facility; wrong site/side/patient/procedure/implant event; or a hospital transfer or hospital admission upon discharge from the facility. (G8907) Patient did not have preoperative order for IV antibiotic SSI prophylaxis. (G8918)  

## 2013-03-16 NOTE — Progress Notes (Signed)
Lidocaine-40mg IV prior to Propofol InductionPropofol given over incremental dosages 

## 2013-03-17 ENCOUNTER — Telehealth: Payer: Self-pay

## 2013-03-17 NOTE — Telephone Encounter (Signed)
Left message on answering machine. 

## 2013-03-23 ENCOUNTER — Encounter: Payer: Self-pay | Admitting: Internal Medicine

## 2013-04-05 ENCOUNTER — Telehealth: Payer: Self-pay | Admitting: Gastroenterology

## 2013-04-05 ENCOUNTER — Ambulatory Visit (INDEPENDENT_AMBULATORY_CARE_PROVIDER_SITE_OTHER): Payer: Medicare Other | Admitting: Family Medicine

## 2013-04-05 ENCOUNTER — Encounter: Payer: Self-pay | Admitting: Family Medicine

## 2013-04-05 VITALS — BP 128/53 | HR 103 | Temp 98.1°F | Ht 67.0 in | Wt 213.0 lb

## 2013-04-05 DIAGNOSIS — E119 Type 2 diabetes mellitus without complications: Secondary | ICD-10-CM

## 2013-04-05 DIAGNOSIS — L301 Dyshidrosis [pompholyx]: Secondary | ICD-10-CM

## 2013-04-05 DIAGNOSIS — K824 Cholesterolosis of gallbladder: Secondary | ICD-10-CM

## 2013-04-05 LAB — POCT GLYCOSYLATED HEMOGLOBIN (HGB A1C): Hemoglobin A1C: 7.1

## 2013-04-05 MED ORDER — KETOCONAZOLE 2 % EX CREA: TOPICAL_CREAM | Freq: Two times a day (BID) | CUTANEOUS | Status: DC

## 2013-04-05 NOTE — Assessment & Plan Note (Signed)
Patient states that his ear infection is almost completely cleared. Refilled ketoconazole 2% today Eyes patient to use this sparingly and only when having flares; reviewed side effects of chronic steroid cream use

## 2013-04-05 NOTE — Assessment & Plan Note (Signed)
Incidental finding on ultrasound in September. We will repeat ultrasound in one year for stability

## 2013-04-05 NOTE — Progress Notes (Signed)
   Subjective:    Patient ID: Wyatt Sandoval, male    DOB: September 01, 1960, 52 y.o.   MRN: [id number]  HPI  Foot infection: Patient's the infection on his left medial aspect of his foot has improved with ketoconazole 2%. He is asking for refill today in case it starts coming back.  HEP C: Patient recently had endoscopies, colonoscopy and ultrasound for his hepatitis C. Endoscopy and colonoscopy were normal. He has been referred to the new liver clinic and has an appointment for January 3.  Ultrasound showed a small gallbladder polyp which we will followup in 6 months to 12 months with a repeat ultrasound.   Diabetes: Patient taking metformin twice a day. Hemoglobin A1c today 7.1. States that his CBGs are greater on 110-120 every morning. No hyper or hypoglycemic events to report. He states he has not been following his diet as planned. Denies any open sores on foot or nonhealing wound.   Review of Systems Negative, with the exception of above mentioned in HPI  Objective:   Physical Exam BP 128/53  Pulse 103  Temp(Src) 98.1 F (36.7 C) (Oral)  Ht 5\' 7"  (1.702 m)  Wt 213 lb (96.616 kg)  BMI 33.35 kg/m2 Gen: NAD.  CV: RRR  Chest: CTAB, no wheeze or crackles Abd: Soft. NTND. BS present. No Masses palpated. No HSM Ext: No erythema. No edema.   US Abdomen Limited Ruq  01/09/2013   *RADIOLOGY REPORT*  Clinical Data:  History of hepatitis C and cirrhosis.  Evaluate for hepatocellular carcinoma.  LIMITED ABDOMINAL ULTRASOUND - RIGHT UPPER QUADRANT  Comparison:  None.  Findings:  Gallbladder:  Moderately distended.  4 mm polyp.  No shadowing stones or wall thickening.  No pericholecystic fluid.  Common bile duct:  Normal in caliber with no stones.  The common bile duct measures 4.2 mm.  Liver:  Mildly coarsened echotexture.  The liver is normal overall size.  No mass or focal lesion is seen. Hepatopetal flow noted in the portal vein.  IMPRESSION: No sonographic evidence of hepatocellular  carcinoma. Mildly coarsened liver echotexture, without other findings of cirrhosis.  Small gallbladder polyp.  No other abnormalities.                    Original Report Authenticated By: Amie Portland, M.D.

## 2013-04-05 NOTE — Patient Instructions (Signed)
Hepatitis C Hepatitis C is a viral infection of the liver. Infection may go undetected for months or years because symptoms may be absent or very mild. Chronic liver disease is the main danger of hepatitis C. This may lead to scarring of the liver (cirrhosis), liver failure, and liver cancer. CAUSES  Hepatitis C is caused by the hepatitis C virus (HCV). Formerly, hepatitis C infections were most commonly transmitted through blood transfusions. In the early 1990s, routine testing of donated blood for hepatitis C and exclusion of blood that tests positive for HCV began. Now, HCV is most commonly transmitted from person to person through injection drug use, sharing needles, or sex with an infected person. A caregiver may also get the infection from exposure to the blood of an infected patient by way of a cut or needle stick.  SYMPTOMS  Acute Phase Many cases of acute HCV infection are mild and cause few problems.Some people may not even realize they are sick.Symptoms in others may last a few weeks to several months and include:  Feeling very tired.  Loss of appetite.  Nausea.  Vomiting.  Abdominal pain.  Dark yellow urine.  Yellow skin and eyes (jaundice).  Itching of the skin. Chronic Phase  Between 50% to 85% of people who get HCV infection become "chronic carriers." They often have no symptoms, but the virus stays in their body.They may spread the virus to others and can get long-term liver disease.  Many people with chronic HCV infection remain healthy for many years. However, up to 1 in 5 chronically infected people may develop severe liver diseases including scarring of the liver (cirrhosis), liver failure, or liver cancer. DIAGNOSIS  Diagnosis of hepatitis C infection is made by testing blood for the presence of hepatitis C viral particles called RNA. Other tests may also be done to measure the status of current liver function, exclude other liver problems, or assess liver  damage. TREATMENT  Treatment with many antiviral drugs is available and recommended for some patients with chronic HCV infection. Drug treatment is generally considered appropriate for patients who:  Are 65 years of age or older.  Have a positive test for HCV particles in the blood.  Have a liver tissue sample (biopsy) that shows chronic hepatitis and significant scarring (fibrosis).  Do not have signs of liver failure.  Have acceptable blood test results that confirm the wellness of other body organs.  Are willing to be treated and conform to treatment requirements.  Have no other circumstances that would prevent treatment from being recommended (contraindications). All people who are offered and choose to receive drug treatment must understand that careful medical follow up for many months and even years is crucial in order to make successful care possible. The goal of drug treatment is to eliminate any evidence of HCV in the blood on a long-term basis. This is called a "sustained virologic response" or SVR. Achieving a SVR is associated with a decrease in the chance of life-threatening liver problems, need for a liver transplant, liver cancer rates, and liver-related complications. Successful treatment currently requires taking treatment drugs for at least 24 weeks and up to 72 weeks. An injected drug (interferon) given weekly and an oral antiviral medicine taken daily are usually prescribed. Side effects from these drugs are common and some may be very serious. Your response to treatment must be carefully monitored by both you and your caregiver throughout the entire treatment period. PREVENTION There is no vaccine for hepatitis C. The only  way to prevent the disease is to reduce the risk of exposure to the virus.   Avoid sharing drug needles or personal items like toothbrushes, razors, and nail clippers with an infected person.  Healthcare workers need to avoid injuries and wear  appropriate protective equipment such as gloves, gowns, and face masks when performing invasive medical or nursing procedures. HOME CARE INSTRUCTIONS  To avoid making your liver disease worse:  Strictly avoid drinking alcohol.  Carefully review all new prescriptions of medicines with your caregiver. Ask your caregiver which drugs you should avoid. The following drugs are toxic to the liver, and your caregiver may tell you to avoid them:  Isoniazid.  Methyldopa.  Acetaminophen.  Anabolic steroids (muscle-building drugs).  Erythromycin.  Oral contraceptives (birth control pills).  Check with your caregiver to make sure medicine you are currently taking will not be harmful.  Periodic blood tests may be required. Follow your caregiver's advice about when you should have blood tests.  Avoid a sexual relationship until advised otherwise by your caregiver.  Avoid activities that could expose other people to your blood. Examples include sharing a toothbrush, nail clippers, razors, and needles.  Bed rest is not necessary, but it may make you feel better. Recovery time is not related to the amount of rest you receive.  This infection is contagious. Follow your caregiver's instructions in order to avoid spread of the infection. SEEK IMMEDIATE MEDICAL CARE IF:  You have increasing fatigue or weakness.  You have an oral temperature above 102 F (38.9 C), not controlled by medicine.  You develop loss of appetite, nausea, or vomiting.  You develop jaundice.  You develop easy bruising or bleeding.  You develop any severe problems as a result of your treatment. MAKE SURE YOU:   Understand these instructions.  Will watch your condition.  Will get help right away if you are not doing well or get worse. Document Released: 04/17/2000 Document Revised: 07/13/2011 Document Reviewed: 08/20/2010 [facility] Patient Information 2014 [place], [place].   It was a pleasure seen you today. I am  glad that all of your tests, ultrasound , colonoscopy and endoscopy seem to be looking good. Make sure you keep your appointment with Dr. Dione Housekeeper on January 3.  Will want to see you back in about 3 months, she on any issues she needs discussed before hand

## 2013-04-05 NOTE — Telephone Encounter (Signed)
[facility] sent in a fax confirming pt's appointment  With Dr. Dione Housekeeper on 05/06/2013 @ 11:00am, they are sending him a new pt packet.

## 2013-04-05 NOTE — Assessment & Plan Note (Signed)
A1c is 7.1 today. Patient states he has been watching his diet as much she usually does and is going to attempt to get that back down. No changes to medications today. Followup in 3 months

## 2013-04-25 ENCOUNTER — Telehealth: Payer: Self-pay | Admitting: *Deleted

## 2013-04-25 DIAGNOSIS — E119 Type 2 diabetes mellitus without complications: Secondary | ICD-10-CM

## 2013-04-25 NOTE — Telephone Encounter (Signed)
LMOVM for pt to return call.  Please advise that she is due for a cholesterol screen, order placed.  Please schedule a fasting lab appt. Fleeger, Jessica Dawn 

## 2013-05-01 ENCOUNTER — Other Ambulatory Visit: Payer: Medicare Other

## 2013-05-01 DIAGNOSIS — E119 Type 2 diabetes mellitus without complications: Secondary | ICD-10-CM

## 2013-05-01 LAB — LIPID PANEL
HDL: 36 mg/dL — ABNORMAL LOW (ref >39)
Triglycerides: 150 mg/dL — ABNORMAL HIGH (ref <150)

## 2013-05-01 NOTE — Progress Notes (Signed)
FLP DONE TODAY Wyatt Sandoval 

## 2013-05-05 ENCOUNTER — Other Ambulatory Visit: Payer: Self-pay | Admitting: Family Medicine

## 2013-05-11 ENCOUNTER — Encounter: Payer: Self-pay | Admitting: Family Medicine

## 2013-05-11 ENCOUNTER — Telehealth: Payer: Self-pay | Admitting: Family Medicine

## 2013-05-11 DIAGNOSIS — H4010X0 Unspecified open-angle glaucoma, stage unspecified: Secondary | ICD-10-CM

## 2013-05-11 HISTORY — DX: Unspecified open-angle glaucoma, stage unspecified: H40.10X0

## 2013-05-11 NOTE — Telephone Encounter (Signed)
Faxed order for patients diabetic supplies.  Kuneff, Renee DO

## 2013-07-04 ENCOUNTER — Other Ambulatory Visit: Payer: Self-pay | Admitting: Family Medicine

## 2013-07-25 ENCOUNTER — Other Ambulatory Visit: Payer: Self-pay | Admitting: Family Medicine

## 2013-08-01 ENCOUNTER — Other Ambulatory Visit: Payer: Self-pay | Admitting: Nurse Practitioner

## 2013-08-01 DIAGNOSIS — C220 Liver cell carcinoma: Secondary | ICD-10-CM

## 2013-08-03 ENCOUNTER — Other Ambulatory Visit: Payer: Self-pay | Admitting: Family Medicine

## 2013-08-04 ENCOUNTER — Other Ambulatory Visit: Payer: Self-pay | Admitting: Family Medicine

## 2013-08-07 ENCOUNTER — Other Ambulatory Visit: Payer: Self-pay | Admitting: *Deleted

## 2013-08-08 ENCOUNTER — Ambulatory Visit
Admission: RE | Admit: 2013-08-08 | Discharge: 2013-08-08 | Disposition: A | Discharge status: Home / Self Care | Payer: Medicare Other | Source: Ambulatory Visit | Attending: Nurse Practitioner | Admitting: Nurse Practitioner

## 2013-08-08 DIAGNOSIS — C220 Liver cell carcinoma: Secondary | ICD-10-CM

## 2013-08-08 MED ORDER — AMITRIPTYLINE HCL 150 MG PO TABS: 150.0000 mg | ORAL_TABLET | Freq: Every day | ORAL | Status: DC

## 2013-09-16 ENCOUNTER — Other Ambulatory Visit: Payer: Self-pay | Admitting: Family Medicine

## 2013-10-26 ENCOUNTER — Ambulatory Visit (INDEPENDENT_AMBULATORY_CARE_PROVIDER_SITE_OTHER): Payer: Medicare Other | Admitting: Family Medicine

## 2013-10-26 VITALS — BP 109/63 | HR 102 | Temp 97.0°F | Ht 67.0 in | Wt 200.0 lb

## 2013-10-26 DIAGNOSIS — E119 Type 2 diabetes mellitus without complications: Secondary | ICD-10-CM

## 2013-10-26 DIAGNOSIS — Z23 Encounter for immunization: Secondary | ICD-10-CM

## 2013-10-26 DIAGNOSIS — F17200 Nicotine dependence, unspecified, uncomplicated: Secondary | ICD-10-CM

## 2013-10-26 DIAGNOSIS — I10 Essential (primary) hypertension: Secondary | ICD-10-CM

## 2013-10-26 DIAGNOSIS — Z299 Encounter for prophylactic measures, unspecified: Secondary | ICD-10-CM

## 2013-10-26 DIAGNOSIS — IMO0002 Reserved for concepts with insufficient information to code with codable children: Secondary | ICD-10-CM

## 2013-10-26 DIAGNOSIS — B1920 Unspecified viral hepatitis C without hepatic coma: Secondary | ICD-10-CM

## 2013-10-26 LAB — COMPREHENSIVE METABOLIC PANEL
ALBUMIN: 4.5 g/dL (ref 3.5–5.2)
ALK PHOS: 54 U/L (ref 39–117)
ALT: 20 U/L (ref 0–53)
AST: 21 U/L (ref 0–37)
BILIRUBIN TOTAL: 0.3 mg/dL (ref 0.2–1.2)
BUN: 13 mg/dL (ref 6–23)
CO2: 26 mEq/L (ref 19–32)
Calcium: 9.8 mg/dL (ref 8.4–10.5)
Chloride: 105 mEq/L (ref 96–112)
Creat: 1.21 mg/dL (ref 0.50–1.35)
GLUCOSE: 130 mg/dL — AB (ref 70–99)
Potassium: 4.1 mEq/L (ref 3.5–5.3)
Sodium: 139 mEq/L (ref 135–145)
Total Protein: 7.2 g/dL (ref 6.0–8.3)

## 2013-10-26 LAB — CBC WITH DIFFERENTIAL/PLATELET
Basophils Absolute: 0 10*3/uL (ref 0.0–0.1)
Basophils Relative: 0 % (ref 0–1)
EOS ABS: 0.2 10*3/uL (ref 0.0–0.7)
Eosinophils Relative: 3 % (ref 0–5)
HCT: 39.6 % (ref 39.0–52.0)
HEMOGLOBIN: 13.4 g/dL (ref 13.0–17.0)
Lymphocytes Relative: 47 % — ABNORMAL HIGH (ref 12–46)
Lymphs Abs: 3.1 10*3/uL (ref 0.7–4.0)
MCH: 26.4 pg (ref 26.0–34.0)
MCHC: 33.8 g/dL (ref 30.0–36.0)
MCV: 78 fL (ref 78.0–100.0)
Monocytes Absolute: 0.5 10*3/uL (ref 0.1–1.0)
Monocytes Relative: 7 % (ref 3–12)
NEUTROS PCT: 43 % (ref 43–77)
Neutro Abs: 2.9 10*3/uL (ref 1.7–7.7)
Platelets: 267 10*3/uL (ref 150–400)
RBC: 5.08 MIL/uL (ref 4.22–5.81)
RDW: 15.6 % — ABNORMAL HIGH (ref 11.5–15.5)
WBC: 6.7 10*3/uL (ref 4.0–10.5)

## 2013-10-26 LAB — POCT GLYCOSYLATED HEMOGLOBIN (HGB A1C): HEMOGLOBIN A1C: 7.2

## 2013-10-26 MED ORDER — TETANUS-DIPHTH-ACELL PERTUSSIS 5-2.5-18.5 LF-MCG/0.5 IM SUSP: 0.5000 mL | Freq: Once | INTRAMUSCULAR | Status: DC

## 2013-10-26 NOTE — Progress Notes (Signed)
   Subjective:    Patient ID: Wyatt Sandoval, male    DOB: 1960/05/29, 53 y.o.   MRN: [id number]  HPI Sasan Wilkie is a 53 y.o. male presents to [facility] clinic for questions on paperwork and diabetes follow up.   Diabetes: Patient states he has not been watching his diet. He has been taking metformin 1000 mg twice a day as directed. He has been checking his fasting blood glucose which have been between 110-150. He denies numbness or tingling in his hands or feet. He recently had his eye exam and ophthalmologist, he is checked every 6 months due to open-angle glaucoma.   Hep C: Patient has been following up with specialists at [facility] liver care. Patient has completed Harvoni treatment and has been told he is likely "cured". He seems especially happy that he has received treatment. He is continuing to followup with [facility] liver care.  No evidence of hepatic lesion by her ultrasound April 2015.  Disability paperwork: Patient presents disability paperwork today. The particular paper he has to fill out her papers to be filled out for him. He is wondering if I am able to attest to him not being able to work. Explained to him we are not treating him for his chronic pain and he would be better off asking the provider caring for his chronic pain to help him with his paperwork.  Health maintenance: Patient due for her tetanus vaccination today. Flu shot due in August. Colonoscopy up-to-date.  Review of Systems per orders HPI    Objective:   Physical Exam BP 109/63  Pulse 102  Temp(Src) 97 F (36.1 C) (Oral)  Ht 5\' 7"  (1.702 m)  Wt 200 lb (90.719 kg)  BMI 31.32 kg/m2 Gen: Pleasant, talkative African American male mildly obese. Patient in no acute distress and nontoxic in appearance. HEENT: AT. [place].  Bilateral eyes with mild injections and mild icterus. MMM. Bilateral nares without erythema. Throat without erythema or exudates.  CV: Mildly tachycardic, regular rhythm. No murmurs clicks gallops or  rubs Chest: CTAB, no wheeze or crackles Abd: Soft. Round. NTND. BS present. No Masses palpated.  Ext: No erythema. No edema.  Skin: No rashes, purpura or petechiae.  Neuro:  Normal gait. PERLA. EOMi. Alert. Grossly intact.  Psych: Normal affect, dress, demeanor and speech.

## 2013-10-26 NOTE — Patient Instructions (Addendum)
Keeping you healthy  Get these tests  Blood pressure- Have your blood pressure checked once a year by your healthcare provider.  Normal blood pressure is 120/80  Weight- Have your body mass index (BMI) calculated to screen for obesity.  BMI is a measure of body fat based on height and weight. You can also calculate your own BMI at ViewBanking.si.  Cholesterol- Have your cholesterol checked every year.  Diabetes- Have your blood sugar checked regularly if you have high blood pressure, high cholesterol, have a family history of diabetes or if you are overweight.  Screening for Colon Cancer- Colonoscopy starting at age 35.  Screening may begin sooner depending on your family history and other health conditions. Follow up colonoscopy as directed by your Gastroenterologist.   Take these medicines  Aspirin- One aspirin daily can help prevent Heart disease and Stroke.  Flu shot- Every fall.  Tetanus- Every 10 years.  Zostavax- Once after the age of 92 to prevent Shingles.  Pneumonia shot- Once after the age of 21; if you are younger than 35, ask your healthcare provider if you need a Pneumonia shot.  Take these steps  Don't smoke- If you do smoke, talk to your doctor about quitting.  For tips on how to quit, go to www.smokefree.gov or call 1-800-QUIT-NOW.  Be physically active- Exercise 5 days a week for at least 30 minutes.  If you are not already physically active start slow and gradually work up to 30 minutes of moderate physical activity.  Examples of moderate activity include walking briskly, mowing the yard, dancing, swimming, bicycling, etc.  Eat a healthy diet- Eat a variety of healthy food such as fruits, vegetables, low fat milk, low fat cheese, yogurt, lean meant, poultry, fish, beans, tofu, etc. For more information go to www.thenutritionsource.org  Drink alcohol in moderation- Limit alcohol intake to less than two drinks a day. Never drink and drive.  Dentist-  Brush and floss twice daily; visit your dentist twice a year.  Depression- Your emotional health is as important as your physical health. If you're feeling down, or losing interest in things you would normally enjoy please talk to your healthcare provider.  Eye exam- Visit your eye doctor every year.  Safe sex- If you may be exposed to a sexually transmitted infection, use a condom.  Seat belts- Seat belts can save your life; always wear one.  Smoke/Carbon Monoxide detectors- These detectors need to be installed on the appropriate level of your home.  Replace batteries at least once a year.  Skin cancer- When out in the sun, cover up and use sunscreen 15 SPF or higher.  Violence- If anyone is threatening you, please tell your healthcare provider.  Living Will/ Health care power of attorney- Speak with your healthcare provider and family.

## 2013-10-27 ENCOUNTER — Encounter: Payer: Self-pay | Admitting: Family Medicine

## 2013-10-27 NOTE — Assessment & Plan Note (Addendum)
Pt has completed antiviral therapy, and is following closely with specialists.  CMP today

## 2013-10-27 NOTE — Assessment & Plan Note (Signed)
-  tdap today

## 2013-10-27 NOTE — Assessment & Plan Note (Signed)
At goal today. Pt on vasotec, continue therapy.

## 2013-10-27 NOTE — Assessment & Plan Note (Addendum)
a1c 7.2 today. Pt has not been watching his diet.  - encouraged diet control  - will need to evaluate in 3 months and make adjustments if >7. Currently on 2000 mg metformin daily.  cmp today, cbc

## 2013-10-27 NOTE — Assessment & Plan Note (Signed)
Congratulated him on his smoking cessation!

## 2013-11-21 ENCOUNTER — Other Ambulatory Visit: Payer: Self-pay | Admitting: Family Medicine

## 2013-12-27 ENCOUNTER — Other Ambulatory Visit: Payer: Self-pay | Admitting: Family Medicine

## 2013-12-28 ENCOUNTER — Other Ambulatory Visit: Payer: Self-pay | Admitting: Family Medicine

## 2013-12-28 MED ORDER — SIMVASTATIN 40 MG PO TABS: ORAL_TABLET | ORAL | Status: DC

## 2014-02-24 ENCOUNTER — Other Ambulatory Visit: Payer: Self-pay | Admitting: Internal Medicine

## 2014-02-26 ENCOUNTER — Other Ambulatory Visit (HOSPITAL_COMMUNITY): Payer: Self-pay | Admitting: Nurse Practitioner

## 2014-02-26 ENCOUNTER — Other Ambulatory Visit: Payer: Self-pay | Admitting: Family Medicine

## 2014-02-26 DIAGNOSIS — K7469 Other cirrhosis of liver: Principal | ICD-10-CM

## 2014-02-26 DIAGNOSIS — B1920 Unspecified viral hepatitis C without hepatic coma: Secondary | ICD-10-CM

## 2014-03-07 ENCOUNTER — Encounter: Payer: Self-pay | Admitting: Family Medicine

## 2014-03-07 DIAGNOSIS — R000 Tachycardia, unspecified: Secondary | ICD-10-CM | POA: Insufficient documentation

## 2014-03-08 ENCOUNTER — Ambulatory Visit (HOSPITAL_COMMUNITY): Payer: Medicare Other | Attending: Family Medicine

## 2014-03-08 ENCOUNTER — Ambulatory Visit (INDEPENDENT_AMBULATORY_CARE_PROVIDER_SITE_OTHER): Payer: Medicare Other | Admitting: *Deleted

## 2014-03-08 ENCOUNTER — Encounter: Payer: Self-pay | Admitting: Family Medicine

## 2014-03-08 ENCOUNTER — Ambulatory Visit (INDEPENDENT_AMBULATORY_CARE_PROVIDER_SITE_OTHER): Payer: Medicare Other | Admitting: Family Medicine

## 2014-03-08 VITALS — BP 110/76 | HR 111 | Temp 98.3°F | Ht 67.0 in | Wt 198.6 lb

## 2014-03-08 DIAGNOSIS — Z125 Encounter for screening for malignant neoplasm of prostate: Secondary | ICD-10-CM

## 2014-03-08 DIAGNOSIS — R000 Tachycardia, unspecified: Secondary | ICD-10-CM | POA: Insufficient documentation

## 2014-03-08 DIAGNOSIS — I10 Essential (primary) hypertension: Secondary | ICD-10-CM

## 2014-03-08 DIAGNOSIS — Z0000 Encounter for general adult medical examination without abnormal findings: Secondary | ICD-10-CM | POA: Insufficient documentation

## 2014-03-08 DIAGNOSIS — Z23 Encounter for immunization: Secondary | ICD-10-CM

## 2014-03-08 DIAGNOSIS — E119 Type 2 diabetes mellitus without complications: Secondary | ICD-10-CM

## 2014-03-08 DIAGNOSIS — Z720 Tobacco use: Secondary | ICD-10-CM

## 2014-03-08 DIAGNOSIS — F17200 Nicotine dependence, unspecified, uncomplicated: Secondary | ICD-10-CM

## 2014-03-08 DIAGNOSIS — Z8042 Family history of malignant neoplasm of prostate: Secondary | ICD-10-CM

## 2014-03-08 LAB — POCT GLYCOSYLATED HEMOGLOBIN (HGB A1C): Hemoglobin A1C: 7

## 2014-03-08 MED ORDER — VARENICLINE TARTRATE 0.5 MG X 11 & 1 MG X 42 PO MISC: ORAL | Status: DC

## 2014-03-08 MED ORDER — METOPROLOL TARTRATE 25 MG PO TABS: 12.5000 mg | ORAL_TABLET | Freq: Two times a day (BID) | ORAL | Status: DC

## 2014-03-08 NOTE — Patient Instructions (Signed)
Please continue to watch her diet, watch sugar intake and carbohydrate intake. Exercise at least 150 minutes a week. This should bring her A1c back down to the level we want it. I have called in Chantix for you to start. Her EKG showed sinus tachycardia today. We'll start a low-dose medication, metoprolol, if you experience any dizziness or increased fatigue your to stop the medication and come and see me as soon as possible. I will call you with results of your labs  Nonspecific Tachycardia Tachycardia is a faster than normal heartbeat (more than 100 beats per minute). In adults, the heart normally beats between 60 and 100 times a minute. A fast heartbeat may be a normal response to exercise or stress. It does not necessarily mean that something is wrong. However, sometimes when your heart beats too fast it may not be able to pump enough blood to the rest of your body. This can result in chest pain, shortness of breath, dizziness, and even fainting. Nonspecific tachycardia means that the specific cause or pattern of your tachycardia is unknown. CAUSES  Tachycardia may be harmless or it may be due to a more serious underlying cause. Possible causes of tachycardia include:  Exercise or exertion.  Fever.  Pain or injury.  Infection.  Loss of body fluids (dehydration).  Overactive thyroid.  Lack of red blood cells (anemia).  Anxiety and stress.  Alcohol.  Caffeine.  Tobacco products.  Diet pills.  Illegal drugs.  Heart disease. SYMPTOMS  Rapid or irregular heartbeat (palpitations).  Suddenly feeling your heart beating (cardiac awareness).  Dizziness.  Tiredness (fatigue).  Shortness of breath.  Chest pain.  Nausea.  Fainting. DIAGNOSIS  Your caregiver will perform a physical exam and take your medical history. In some cases, a heart specialist (cardiologist) may be consulted. Your caregiver may also order:  Blood tests.  Electrocardiography. This test records  the electrical activity of your heart.  A heart monitoring test. TREATMENT  Treatment will depend on the likely cause of your tachycardia. The goal is to treat the underlying cause of your tachycardia. Treatment methods may include:  Replacement of fluids or blood through an intravenous (IV) tube for moderate to severe dehydration or anemia.  New medicines or changes in your current medicines.  Diet and lifestyle changes.  Treatment for certain infections.  Stress relief or relaxation methods. HOME CARE INSTRUCTIONS   Rest.  Drink enough fluids to keep your urine clear or pale yellow.  Do not smoke.  Avoid:  Caffeine.  Tobacco.  Alcohol.  Chocolate.  Stimulants such as over-the-counter diet pills or pills that help you stay awake.  Situations that cause anxiety or stress.  Illegal drugs such as marijuana, phencyclidine (PCP), and cocaine.  Only take medicine as directed by your caregiver.  Keep all follow-up appointments as directed by your caregiver. SEEK IMMEDIATE MEDICAL CARE IF:   You have pain in your chest, upper arms, jaw, or neck.  You become weak, dizzy, or feel faint.  You have palpitations that will not go away.  You vomit, have diarrhea, or pass blood in your stool.  Your skin is cool, pale, and wet.  You have a fever that will not go away with rest, fluids, and medicine. MAKE SURE YOU:   Understand these instructions.  Will watch your condition.  Will get help right away if you are not doing well or get worse. Document Released: 05/28/2004 Document Revised: 07/13/2011 Document Reviewed: 03/31/2011 [facility] Patient Information 2015 [place], [place]. This information  is not intended to replace advice given to you by your health care provider. Make sure you discuss any questions you have with your health care provider.  

## 2014-03-08 NOTE — Progress Notes (Signed)
Subjective:    Patient ID: Wyatt Sandoval, male    DOB: Apr 21, 1961, 53 y.o.   MRN: [id number]  HPI Wyatt Sandoval is a 53 y.o. male, presents for routine visit  Hypertension: patient states his blood pressures been alcohol. He hasn't had any chest pain, palpitations. He has noticed occasional headaches. He has been feeling dizzy upon standing on a few occasions. He states he "fell out" approximately 2 months ago. He did not seek medical treatment for this at the time of occurrence. He reports he felt real dizzy, fell to the ground, did not hit his head and recovered quickly. He denies any leg edema.  Diabetes mellitus: patient reports that his blood glucoses fasting in the morning has been between 120 and 130. He denies any hypo-or hyperglycemic events. Although he does describe one event of being dizzy and falling, described above. He denies any numbness or tingling in his extremities. He denies any open wounds. He has been compliant with taking his metformin twice a day.  Screening PSA: patient states that he has a family history of prostate cancer. His brother was diagnosed with prostate cancer at 69, and he states that it was "all over the place" when it was found. His uncle was recently diagnosed with prostate cancer, he is 51 years old. Patient reports no nocturia. No changes in urinary stream. No erectile dysfunction. He does not recall having his PSA tested in the past.  Smoking cessation: patient states that he started smoking again. He reports he had a lot of social stress, and picked up the habit few months ago. He is asking for Chantix, which has been successful for him the past. He is smoking approximately half a pack a day.  Tachycardia: patient has been tachycardic the last 2 visits at his liver specialists appointment. He has been noticing some dizziness on occasions upon standing. He also was dizzy and fell 1 approximately 2 months ago, described above. He has experienced  fatigue. He denies any chest pain, palpitations, shortness of breath.  health maintenance:patient is due for flu shot today  Current every day smoker  Past Medical History  Diagnosis Date  . Diabetes mellitus   . 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 Hyperlipidemia   . Hypertension   . Hepatitis C    Allergies  Allergen Reactions  . Bupropion Other (See Comments)    Memory and thinking impairment - dysphoria     Review of Systems Per history of present illness    Objective:   Physical Exam BP 110/76 mmHg  Pulse 111  Temp(Src) 98.3 F (36.8 C) (Oral)  Ht 5\' 7"  (1.702 m)  Wt 198 lb 9.6 oz (90.084 kg)  BMI 31.10 kg/m2 Gen: pleasant, African-American male, no acute distress, nontoxic in appearance, well-developed, well-nourished, mildly obese. HEENT: AT. [place]. Bilateral TM visualized and normal in appearance. Bilateral eyes without injections or icterus. MMM. Bilateral nares without erythema or swelling. Throat without erythema or exudates.  CV: tachycardic, regular rhythm, no murmur Chest: CTAB, no wheeze or crackles Abd: Soft. round. NTND. BS present. no Masses palpated.  Ext: No erythema. No edema. Plus 2/4 PT/DP Skin: no rashes, purpura or petechiae.  Neuro: Normal gait. PERLA. EOMi. Alert.cranial nerves II through XII intact.  Psych: mildly anxious, talkative. Mildly tangential in thought process and speech. Needs redirected multiple times.  EKG: Sinus tachycardia, mild  atrial enlargement.    Assessment & Plan:

## 2014-03-08 NOTE — Assessment & Plan Note (Addendum)
EKG with sinus tachycardia today. Otherwise normal EKG. Start low-dose metoprolol 12.5 twice a day. TSH ordered today. Patient encouraged to stay well-hydrated. AVS on tachycardia given. Patient to follow-up 4 weeks, he states that he likely won't be able to return until the beginning of January. Red flags discussed with patient and when to seek immediate treatment course.medication. If no improvement, patient is aware that we will want him to go see cardiology, although he is resistant.

## 2014-03-08 NOTE — Assessment & Plan Note (Signed)
Patient relapsed, smoking every day. desires to quit smoking again. - Chantix starter pack prescribed

## 2014-03-08 NOTE — Assessment & Plan Note (Signed)
Patient has a strong family history of prostate cancer.  He is African-American male as well. I do not see where he has been tested for a PSA in the past. He currently denies any urination symptoms. - PSA collected We will call patient with results.

## 2014-03-08 NOTE — Assessment & Plan Note (Signed)
Blood pressure at goal today, continue current regimen

## 2014-03-08 NOTE — Assessment & Plan Note (Signed)
Flu shot given today

## 2014-03-08 NOTE — Assessment & Plan Note (Signed)
Continue current regimen 1000 mg metformin twice a day, continue to watch diet using sugar-free products when possible, exercise at least 150 minutes a week. A1c today 7.0 Follow-up in 3 months with A1c

## 2014-03-09 ENCOUNTER — Telehealth: Payer: Self-pay | Admitting: Family Medicine

## 2014-03-09 ENCOUNTER — Encounter: Payer: Self-pay | Admitting: Family Medicine

## 2014-03-09 LAB — PSA, MEDICARE: PSA: 0.51 ng/mL (ref <=4.00)

## 2014-03-09 NOTE — Telephone Encounter (Signed)
Please call Wyatt Sandoval and tell him that his PSA is completely normal. I will send the results via letter as well for his records. Thanks.

## 2014-03-09 NOTE — Telephone Encounter (Signed)
LVM for patient to call back to inform him of below blood work results

## 2014-03-12 ENCOUNTER — Ambulatory Visit (HOSPITAL_COMMUNITY): Payer: Medicare Other

## 2014-03-12 ENCOUNTER — Other Ambulatory Visit: Payer: Self-pay | Admitting: Family Medicine

## 2014-03-12 MED ORDER — VARENICLINE TARTRATE 0.5 MG X 11 & 1 MG X 42 PO MISC: ORAL | Status: DC

## 2014-03-12 NOTE — Telephone Encounter (Signed)
Pt went to pharmacy to pick up rx and was told it was never sent.  According to chart rx ws phoned in.  Please contact pharmacy for confirmation and then inform patient when new one has been given.

## 2014-03-12 NOTE — Telephone Encounter (Signed)
Spoke with patient and informed him of below 

## 2014-03-12 NOTE — Telephone Encounter (Signed)
Resent rx to pharmacy.

## 2014-03-13 ENCOUNTER — Ambulatory Visit (HOSPITAL_COMMUNITY)
Admission: RE | Admit: 2014-03-13 | Discharge: 2014-03-13 | Disposition: A | Discharge status: Home / Self Care | Payer: Medicare Other | Source: Ambulatory Visit | Attending: Nurse Practitioner | Admitting: Nurse Practitioner

## 2014-03-13 ENCOUNTER — Telehealth: Payer: Self-pay | Admitting: Family Medicine

## 2014-03-13 DIAGNOSIS — K7460 Unspecified cirrhosis of liver: Secondary | ICD-10-CM | POA: Insufficient documentation

## 2014-03-13 DIAGNOSIS — B1920 Unspecified viral hepatitis C without hepatic coma: Secondary | ICD-10-CM | POA: Diagnosis present

## 2014-03-13 DIAGNOSIS — K7469 Other cirrhosis of liver: Secondary | ICD-10-CM

## 2014-03-13 NOTE — Telephone Encounter (Signed)
I do not see where the TSH was drawn when the PSA was drawn. I am not certain why this did not occur. Please call pt and have make a lab appointment to have this drawn as it could potentially be the cause of his tachycardia. Thanks.

## 2014-03-19 ENCOUNTER — Other Ambulatory Visit: Payer: Self-pay | Admitting: Family Medicine

## 2014-03-26 ENCOUNTER — Telehealth: Payer: Self-pay | Admitting: Family Medicine

## 2014-03-26 NOTE — Telephone Encounter (Signed)
Rx was printed by mistake.  Called in verbally, pt informed. Fleeger, Salome Spotted

## 2014-03-26 NOTE — Telephone Encounter (Signed)
Pt called because he pharmacy said that they never received a prescription from [place] on 11/09. We called in Chantix, can we call them again. jw

## 2014-04-23 ENCOUNTER — Other Ambulatory Visit: Payer: Self-pay | Admitting: Family Medicine

## 2014-05-31 ENCOUNTER — Other Ambulatory Visit: Payer: Self-pay | Admitting: Family Medicine

## 2014-06-18 ENCOUNTER — Other Ambulatory Visit: Payer: Self-pay | Admitting: Internal Medicine

## 2014-06-26 ENCOUNTER — Other Ambulatory Visit: Payer: Self-pay | Admitting: Internal Medicine

## 2014-07-12 ENCOUNTER — Other Ambulatory Visit: Payer: Self-pay | Admitting: Family Medicine

## 2014-07-13 ENCOUNTER — Other Ambulatory Visit: Payer: Self-pay | Admitting: *Deleted

## 2014-07-13 MED ORDER — ENALAPRIL MALEATE 5 MG PO TABS: 5.0000 mg | ORAL_TABLET | Freq: Every day | ORAL | Status: DC

## 2014-08-01 ENCOUNTER — Other Ambulatory Visit: Payer: Self-pay | Admitting: Family Medicine

## 2014-08-02 ENCOUNTER — Other Ambulatory Visit: Payer: Self-pay | Admitting: Family Medicine

## 2014-09-13 ENCOUNTER — Other Ambulatory Visit: Payer: Self-pay | Admitting: Family Medicine

## 2014-10-15 ENCOUNTER — Other Ambulatory Visit: Payer: Self-pay | Admitting: *Deleted

## 2014-10-15 MED ORDER — AMITRIPTYLINE HCL 150 MG PO TABS: 150.0000 mg | ORAL_TABLET | Freq: Every day | ORAL | Status: DC

## 2014-11-17 ENCOUNTER — Other Ambulatory Visit: Payer: Self-pay | Admitting: Family Medicine

## 2014-11-20 ENCOUNTER — Other Ambulatory Visit: Payer: Self-pay | Admitting: Family Medicine

## 2014-12-17 ENCOUNTER — Other Ambulatory Visit: Payer: Self-pay | Admitting: *Deleted

## 2014-12-19 ENCOUNTER — Other Ambulatory Visit: Payer: Self-pay | Admitting: Family Medicine

## 2014-12-19 MED ORDER — SIMVASTATIN 40 MG PO TABS
40.0000 mg | ORAL_TABLET | Freq: Every evening | ORAL | Status: DC
Start: 2014-12-19 — End: 2015-04-05

## 2014-12-25 ENCOUNTER — Ambulatory Visit (INDEPENDENT_AMBULATORY_CARE_PROVIDER_SITE_OTHER): Payer: Medicare Other | Admitting: Family Medicine

## 2014-12-25 ENCOUNTER — Encounter: Payer: Self-pay | Admitting: Family Medicine

## 2014-12-25 VITALS — BP 138/82 | HR 89 | Temp 97.9°F | Ht 67.0 in | Wt 200.2 lb

## 2014-12-25 DIAGNOSIS — Z720 Tobacco use: Secondary | ICD-10-CM

## 2014-12-25 DIAGNOSIS — I10 Essential (primary) hypertension: Secondary | ICD-10-CM | POA: Diagnosis not present

## 2014-12-25 DIAGNOSIS — E119 Type 2 diabetes mellitus without complications: Secondary | ICD-10-CM | POA: Diagnosis present

## 2014-12-25 DIAGNOSIS — F17200 Nicotine dependence, unspecified, uncomplicated: Secondary | ICD-10-CM

## 2014-12-25 LAB — POCT GLYCOSYLATED HEMOGLOBIN (HGB A1C): Hemoglobin A1C: 7.1

## 2014-12-25 MED ORDER — VARENICLINE TARTRATE 0.5 MG X 11 & 1 MG X 42 PO MISC: ORAL | Status: DC

## 2014-12-25 MED ORDER — METFORMIN HCL 1000 MG PO TABS: 1000.0000 mg | ORAL_TABLET | Freq: Two times a day (BID) | ORAL | Status: DC

## 2014-12-25 NOTE — Assessment & Plan Note (Signed)
Patient currently up to 7.1. Discussed two options: either adjustment of diet or adding medication. Will try diet modification for the next 3 months. Discussed weight loss. Will follow-up in 3 months.

## 2014-12-25 NOTE — Assessment & Plan Note (Signed)
Started Chantix. 3 month supply. Will follow-up.

## 2014-12-25 NOTE — Assessment & Plan Note (Signed)
Controlled. No changes. 

## 2014-12-25 NOTE — Patient Instructions (Signed)
Thank you for coming to see me today. It was a pleasure. Today we talked about:   Diabetes: your A1C is a little worse today. We discussed weight loss to help bring this down without starting new medication.  Tobacco dependence: We are starting chantix. I have included some information about safety. Please read this. Good luck  Hypertension: your blood pressure looks great today  Please make an appointment to see me in 3 months for physical.  If you have any questions or concerns, please do not hesitate to call the office at [phone].  Sincerely,  Cordelia Poche, MD

## 2014-12-25 NOTE — Progress Notes (Signed)
    Subjective    Wyatt Sandoval is a 54 y.o. male that presents for a follow-up visit for chronic issues.   1. Diabetes: Adherent with metformin although, he has not had any for about one week. He does not check his blood sugars. No low blood sugars. He reports some blurry vision at times. He reports seeing the ophthalmologist 2-3 months ago.  2. Hypertension: Adherent with enalapril 5mg  daily but taking metoprolol 25mg  qD. No chest pain or shortness of breath.  3. Tabacco abuse: Currently smoking 1.5 PPD. Would like to quit smoking on 8/25. Chantix has worked in the past. He would like to use it again.   Social History  Substance Use Topics  . Smoking status: Current Every Day Smoker -- 1.00 packs/day    Types: Cigarettes    Last Attempt to Quit: 02/27/2013  . Smokeless tobacco: Never Used     Comment: pt has nicotine patch  . Alcohol Use: Yes     Comment: once or twice a year    Allergies  Allergen Reactions  . Bupropion Other (See Comments)    Memory and thinking impairment - dysphoria    No orders of the defined types were placed in this encounter.    ROS  Per HPI   Objective   BP 138/82 mmHg  Pulse 89  Temp(Src) 97.9 F (36.6 C) (Oral)  Ht 5\' 7"  (1.702 m)  Wt 200 lb 3.2 oz (90.81 kg)  BMI 31.35 kg/m2  General: Well appearing, no distress Extremities: Foot exam documented in chart  Assessment and Plan   Please refer to problem based charting of assessment and plan

## 2015-01-16 ENCOUNTER — Other Ambulatory Visit: Payer: Self-pay | Admitting: Family Medicine

## 2015-01-21 ENCOUNTER — Other Ambulatory Visit: Payer: Self-pay | Admitting: *Deleted

## 2015-01-21 MED ORDER — AMITRIPTYLINE HCL 150 MG PO TABS: 150.0000 mg | ORAL_TABLET | Freq: Every day | ORAL | Status: DC

## 2015-01-21 NOTE — Addendum Note (Signed)
Addended by: Levert Feinstein F on: 01/21/2015 11:01 AM   Modules accepted: Orders

## 2015-01-21 NOTE — Telephone Encounter (Signed)
Has been out of med since thurs  Please refill asap

## 2015-01-21 NOTE — Telephone Encounter (Signed)
Unsure of what medications patient needs refills

## 2015-01-22 NOTE — Telephone Encounter (Signed)
Patient informed that refill is ready for pick up at [facility].  Derl Barrow, RN

## 2015-01-22 NOTE — Telephone Encounter (Signed)
Pt calling about this request and states that the pharmacy is telling him that they have not received it. I verified pharmacy and it is correct. Sadie Reynolds, ASA

## 2015-02-14 ENCOUNTER — Ambulatory Visit (INDEPENDENT_AMBULATORY_CARE_PROVIDER_SITE_OTHER): Payer: Medicare Other | Admitting: Family Medicine

## 2015-02-14 ENCOUNTER — Encounter: Payer: Self-pay | Admitting: Family Medicine

## 2015-02-14 VITALS — BP 107/75 | HR 104 | Temp 98.1°F | Ht 67.0 in | Wt 191.5 lb

## 2015-02-14 DIAGNOSIS — L259 Unspecified contact dermatitis, unspecified cause: Secondary | ICD-10-CM

## 2015-02-14 DIAGNOSIS — E119 Type 2 diabetes mellitus without complications: Secondary | ICD-10-CM | POA: Diagnosis not present

## 2015-02-14 DIAGNOSIS — K219 Gastro-esophageal reflux disease without esophagitis: Secondary | ICD-10-CM

## 2015-02-14 DIAGNOSIS — Z23 Encounter for immunization: Secondary | ICD-10-CM | POA: Diagnosis not present

## 2015-02-14 HISTORY — DX: Unspecified contact dermatitis, unspecified cause: L25.9

## 2015-02-14 MED ORDER — TRIAMCINOLONE ACETONIDE 0.5 % EX OINT: 1.0000 "application " | TOPICAL_OINTMENT | Freq: Two times a day (BID) | CUTANEOUS | Status: DC

## 2015-02-14 MED ORDER — OMEPRAZOLE 40 MG PO CPDR: 40.0000 mg | DELAYED_RELEASE_CAPSULE | Freq: Every day | ORAL | Status: DC

## 2015-02-14 NOTE — Patient Instructions (Signed)
Contact Dermatitis Dermatitis is redness, soreness, and swelling (inflammation) of the skin. Contact dermatitis is a reaction to certain substances that touch the skin. There are two types of contact dermatitis:   Irritant contact dermatitis. This type is caused by something that irritates your skin, such as dry hands from washing them too much. This type does not require previous exposure to the substance for a reaction to occur. This type is more common.  Allergic contact dermatitis. This type is caused by a substance that you are allergic to, such as a nickel allergy or poison ivy. This type only occurs if you have been exposed to the substance (allergen) before. Upon a repeat exposure, your body reacts to the substance. This type is less common. CAUSES  Many different substances can cause contact dermatitis. Irritant contact dermatitis is most commonly caused by exposure to:   Makeup.   Soaps.   Detergents.   Bleaches.   Acids.   Metal salts, such as nickel.  Allergic contact dermatitis is most commonly caused by exposure to:   Poisonous plants.   Chemicals.   Jewelry.   Latex.   Medicines.   Preservatives in products, such as clothing.  RISK FACTORS This condition is more likely to develop in:   People who have jobs that expose them to irritants or allergens.  People who have certain medical conditions, such as asthma or eczema.  SYMPTOMS  Symptoms of this condition may occur anywhere on your body where the irritant has touched you or is touched by you. Symptoms include:  Dryness or flaking.   Redness.   Cracks.   Itching.   Pain or a burning feeling.   Blisters.  Drainage of small amounts of blood or clear fluid from skin cracks. With allergic contact dermatitis, there may also be swelling in areas such as the eyelids, mouth, or genitals.  DIAGNOSIS  This condition is diagnosed with a medical history and physical exam. A patch skin test  may be performed to help determine the cause. If the condition is related to your job, you may need to see an occupational medicine specialist. TREATMENT Treatment for this condition includes figuring out what caused the reaction and protecting your skin from further contact. Treatment may also include:   Steroid creams or ointments. Oral steroid medicines may be needed in more severe cases.  Antibiotics or antibacterial ointments, if a skin infection is present.  Antihistamine lotion or an antihistamine taken by mouth to ease itching.  A bandage (dressing). HOME CARE INSTRUCTIONS Skin Care  Moisturize your skin as needed.   Apply cool compresses to the affected areas.  Try taking a bath with:  Epsom salts. Follow the instructions on the packaging. You can get these at your local pharmacy or grocery store.  Baking soda. Pour a small amount into the bath as directed by your health care provider.  Colloidal oatmeal. Follow the instructions on the packaging. You can get this at your local pharmacy or grocery store.  Try applying baking soda paste to your skin. Stir water into baking soda until it reaches a paste-like consistency.  Do not scratch your skin.  Bathe less frequently, such as every other day.  Bathe in lukewarm water. Avoid using hot water. Medicines  Take or apply over-the-counter and prescription medicines only as told by your health care provider.   If you were prescribed an antibiotic medicine, take or apply your antibiotic as told by your health care provider. Do not stop using the   antibiotic even if your condition starts to improve. General Instructions  Keep all follow-up visits as told by your health care provider. This is important.  Avoid the substance that caused your reaction. If you do not know what caused it, keep a journal to try to track what caused it. Write down:  What you eat.  What cosmetic products you use.  What you drink.  What  you wear in the affected area. This includes jewelry.  If you were given a dressing, take care of it as told by your health care provider. This includes when to change and remove it. SEEK MEDICAL CARE IF:   Your condition does not improve with treatment.  Your condition gets worse.  You have signs of infection such as swelling, tenderness, redness, soreness, or warmth in the affected area.  You have a fever.  You have new symptoms. SEEK IMMEDIATE MEDICAL CARE IF:   You have a severe headache, neck pain, or neck stiffness.  You vomit.  You feel very sleepy.  You notice red streaks coming from the affected area.  Your bone or joint underneath the affected area becomes painful after the skin has healed.  The affected area turns darker.  You have difficulty breathing.   This information is not intended to replace advice given to you by your health care provider. Make sure you discuss any questions you have with your health care provider.   Document Released: 04/17/2000 Document Revised: 01/09/2015 Document Reviewed: 09/05/2014 Elsevier Interactive Patient Education 2016 [employer]  

## 2015-02-15 ENCOUNTER — Other Ambulatory Visit: Payer: Self-pay | Admitting: Family Medicine

## 2015-02-18 ENCOUNTER — Other Ambulatory Visit: Payer: Self-pay | Admitting: Family Medicine

## 2015-02-21 NOTE — Assessment & Plan Note (Addendum)
Likely irritant vs allergic contact dermatitis 2/2 Just For Men, swelling, itching, pain on face under facial hair, elevated risk for secondary infection given diabetes - do not use this product again, do not shave until swelling improves as this will irritate further - gave triamcinolone for symptomatic relief, also rec emollients and can try benadryl - rtc if not improving in 2-3 days

## 2015-02-21 NOTE — Progress Notes (Signed)
   Subjective:   Wyatt Sandoval is a 54 y.o. male with a history of DM, eczema here for facial rash  Patient reports that on 10/11 he applied Just for men dye to his facial hair for the first time. It began to itch a few hours later and the next morning he had swelling of all the areas under his beard with redness and pain. He hasn't taken anything and the pain is not improved or worsened by anything. It stays in the area of rash on his face and neck (all areas covered by facial hair). No fever/chills  Review of Systems:  Per HPI. All other systems reviewed and are negative.   PMH, PSH, Medications, Allergies, and FmHx reviewed and updated in EMR.  Social History: current smoker  Objective:  BP 107/75 mmHg  Pulse 104  Temp(Src) 98.1 F (36.7 C) (Oral)  Ht 5\' 7"  (1.702 m)  Wt 191 lb 8 oz (86.864 kg)  BMI 29.99 kg/m2  Gen:  54 y.o. male in NAD HEENT: NCAT, MMM, EOMI, PERRL, anicteric sclerae Skin: facial rash with swelling and redness under all areas covered by facial hair, no rash or inflammation elsewhere, mildly tender CV: RRR, no MRG, no JVD Resp: Non-labored, CTAB, no wheezes noted Abd: Soft, NTND, BS present, no guarding or organomegaly Ext: WWP, no edema MSK: Full ROM, strength intact Neuro: Alert and oriented, speech normal      Chemistry      Component Value Date/Time   NA 139 10/26/2013 1553   K 4.1 10/26/2013 1553   CL 105 10/26/2013 1553   CO2 26 10/26/2013 1553   BUN 13 10/26/2013 1553   CREATININE 1.21 10/26/2013 1553   CREATININE 1.1 03/13/2013 1017      Component Value Date/Time   CALCIUM 9.8 10/26/2013 1553   ALKPHOS 54 10/26/2013 1553   AST 21 10/26/2013 1553   ALT 20 10/26/2013 1553   BILITOT 0.3 10/26/2013 1553      Lab Results  Component Value Date   WBC 6.7 10/26/2013   HGB 13.4 10/26/2013   HCT 39.6 10/26/2013   MCV 78.0 10/26/2013   PLT 267 10/26/2013   Lab Results  Component Value Date   TSH 0.510 01/21/2007   Lab Results    Component Value Date   HGBA1C 7.1 12/25/2014   Assessment & Plan:     Wyatt Sandoval is a 54 y.o. male here for rash  Contact dermatitis Likely irritant vs allergic contact dermatitis 2/2 Just For Men, swelling, itching, pain on face under facial hair - do not use this product again, do not shave until swelling improves as this will irritate further - gave triamcinolone for symptomatic relief, also rec emollients and can try benadryl - rtc if not improving in 2-3 days    Beverlyn Roux, MD, MPH [facility] PGY-3 02/21/2015 11:00 AM

## 2015-04-01 ENCOUNTER — Other Ambulatory Visit: Payer: Self-pay | Admitting: Family Medicine

## 2015-04-02 NOTE — Telephone Encounter (Signed)
Refill approved. Sent to pharmacy. 

## 2015-04-05 ENCOUNTER — Other Ambulatory Visit: Payer: Self-pay | Admitting: Family Medicine

## 2015-04-17 ENCOUNTER — Ambulatory Visit (INDEPENDENT_AMBULATORY_CARE_PROVIDER_SITE_OTHER): Payer: Medicare Other | Admitting: Family Medicine

## 2015-04-17 ENCOUNTER — Encounter: Payer: Self-pay | Admitting: Family Medicine

## 2015-04-17 ENCOUNTER — Other Ambulatory Visit: Payer: Self-pay | Admitting: Family Medicine

## 2015-04-17 VITALS — BP 114/76 | HR 93 | Temp 97.6°F | Ht 67.0 in | Wt 195.7 lb

## 2015-04-17 DIAGNOSIS — B182 Chronic viral hepatitis C: Secondary | ICD-10-CM

## 2015-04-17 DIAGNOSIS — E785 Hyperlipidemia, unspecified: Secondary | ICD-10-CM | POA: Diagnosis not present

## 2015-04-17 DIAGNOSIS — E119 Type 2 diabetes mellitus without complications: Secondary | ICD-10-CM

## 2015-04-17 LAB — POCT GLYCOSYLATED HEMOGLOBIN (HGB A1C): Hemoglobin A1C: 6.7

## 2015-04-17 LAB — HM DIABETES EYE EXAM

## 2015-04-17 MED ORDER — SIMVASTATIN 40 MG PO TABS: 40.0000 mg | ORAL_TABLET | Freq: Every evening | ORAL | Status: DC

## 2015-04-17 NOTE — Assessment & Plan Note (Signed)
Obtained CMP.  I wonder if he is a candidate for treatment.

## 2015-04-17 NOTE — Progress Notes (Signed)
   Subjective:    Patient ID: Wyatt Sandoval, male    DOB: [birth year], 54 y.o.   MRN: [id number]  HPI I am pleased that Wyatt Sandoval is taking better care of himself and his diabetes.  He is here because his blood sugars have been running a bit high 160-200 range and wants to get it under control.  No change in meds or diet.  Does have an acute left eye inflammation.  No other signs of infection.  States that he has been taking meds regularly and trying to eat healthier.  Also has a question about a thick toenail on left foot.  Refuses pnuemonia vaccination today. Does have an appointment with the eye doc this afternoon for this left eye inflammation.  Asked to make sure eye doc knows of his diabetes and sends [place] a report.  Is willing to have an overdue lipid panel drawn.    Review of Systems     Objective:   Physical Exam A1C improved at 6.7 VS noted, weight trend is nicely down. Lungs clear Cardiac RRR without m or g Left eye obviously inflammed - likely conjunctivitis, but I left exam to eye doc. Left foot. Onycomycosis of middle toenail, which was trimmed.  Instructed how to file.       Assessment & Plan:

## 2015-04-17 NOTE — Patient Instructions (Signed)
Nice meeting you Your 3 month A1C test is great at 6.7.  We want it below 7.0 Keep taking good care of your feet. I sent the cholesterol medication to the pharmacy. I will call with the blood test results. Great job on quitting smoking.

## 2015-04-17 NOTE — Assessment & Plan Note (Signed)
Refilled simvastatin and obtain lipid panel

## 2015-04-17 NOTE — Assessment & Plan Note (Signed)
A1C at goal

## 2015-04-18 ENCOUNTER — Encounter: Payer: Self-pay | Admitting: Family Medicine

## 2015-04-18 LAB — COMPREHENSIVE METABOLIC PANEL
ALT: 28 U/L (ref 9–46)
AST: 22 U/L (ref 10–35)
Albumin: 4.5 g/dL (ref 3.6–5.1)
Alkaline Phosphatase: 55 U/L (ref 40–115)
BUN: 17 mg/dL (ref 7–25)
CHLORIDE: 100 mmol/L (ref 98–110)
CO2: 27 mmol/L (ref 20–31)
CREATININE: 1.05 mg/dL (ref 0.70–1.33)
Calcium: 9.9 mg/dL (ref 8.6–10.3)
GLUCOSE: 91 mg/dL (ref 65–99)
POTASSIUM: 5.1 mmol/L (ref 3.5–5.3)
SODIUM: 137 mmol/L (ref 135–146)
TOTAL PROTEIN: 7.2 g/dL (ref 6.1–8.1)
Total Bilirubin: 0.3 mg/dL (ref 0.2–1.2)

## 2015-04-18 LAB — LIPID PANEL
CHOL/HDL RATIO: 4 ratio (ref <=5.0)
Cholesterol: 203 mg/dL — ABNORMAL HIGH (ref 125–200)
HDL: 51 mg/dL (ref >=40)
LDL Cholesterol: 104 mg/dL (ref <130)
Triglycerides: 241 mg/dL — ABNORMAL HIGH (ref <150)
VLDL: 48 mg/dL — AB (ref <30)

## 2015-04-19 ENCOUNTER — Encounter: Payer: Self-pay | Admitting: Family Medicine

## 2015-04-19 ENCOUNTER — Ambulatory Visit (INDEPENDENT_AMBULATORY_CARE_PROVIDER_SITE_OTHER): Payer: Medicare Other | Admitting: Family Medicine

## 2015-04-19 ENCOUNTER — Other Ambulatory Visit (HOSPITAL_COMMUNITY)
Admission: RE | Admit: 2015-04-19 | Discharge: 2015-04-19 | Disposition: A | Discharge status: Home / Self Care | Payer: Medicare Other | Source: Ambulatory Visit | Attending: Family Medicine | Admitting: Family Medicine

## 2015-04-19 ENCOUNTER — Ambulatory Visit: Payer: Medicare Other | Admitting: Family Medicine

## 2015-04-19 VITALS — BP 141/70 | HR 96 | Temp 98.0°F | Wt 196.0 lb

## 2015-04-19 DIAGNOSIS — Z113 Encounter for screening for infections with a predominantly sexual mode of transmission: Secondary | ICD-10-CM | POA: Diagnosis not present

## 2015-04-19 DIAGNOSIS — Z114 Encounter for screening for human immunodeficiency virus [HIV]: Secondary | ICD-10-CM | POA: Diagnosis not present

## 2015-04-19 NOTE — Progress Notes (Signed)
   Subjective:    Patient ID: Wyatt Sandoval, male    DOB: 14-Sep-1960, 54 y.o.   MRN: [id number]  Seen for Same day visit for   CC: STD screening   He reports being informed by recent partner that she had syphilis as well as other diseases.  - He denies history of previous STDs - Denies any current symptoms: No fevers, chills, dysuria, urethral discharge, penile lesions, lymphadenopathy - Reports history of hepatitis C status post treatment  Review of Systems   See HPI for ROS. Objective:  BP 141/70 mmHg  Pulse 96  Temp(Src) 98 F (36.7 C) (Oral)  Wt 196 lb (88.905 kg)  General: NAD Cardiac: RRR, normal heart sounds, no murmurs. 2+ radial and PT pulses bilaterally Respiratory: CTAB, normal effort Abdomen: soft, nontender, nondistended, no hepatic or splenomegaly. Bowel sounds present GU: No penile lesions.  No inguinal adenopathy.  No urethral discharge    Assessment & Plan:   Screening for STD (sexually transmitted disease) Currently asymptomatic but is told by sexual partner that she had syphilis as well as "other diseases" - Check RPR, HIV, urine GC and chlamydia - Call with results: He gives verbal permission to leave detailed information on cell voicemail

## 2015-04-19 NOTE — Assessment & Plan Note (Signed)
Currently asymptomatic but is told by sexual partner that she had syphilis as well as "other diseases" - Check RPR, HIV, urine GC and chlamydia - Call with results: He gives verbal permission to leave detailed information on cell voicemail

## 2015-04-20 LAB — HIV ANTIBODY (ROUTINE TESTING W REFLEX): HIV 1&2 Ab, 4th Generation: NONREACTIVE

## 2015-04-20 LAB — RPR

## 2015-04-22 LAB — URINE CYTOLOGY ANCILLARY ONLY
Chlamydia: NEGATIVE
NEISSERIA GONORRHEA: NEGATIVE

## 2015-04-23 ENCOUNTER — Telehealth: Payer: Self-pay | Admitting: Family Medicine

## 2015-04-23 ENCOUNTER — Other Ambulatory Visit: Payer: Self-pay | Admitting: Family Medicine

## 2015-04-23 NOTE — Telephone Encounter (Signed)
Pt is calling to see if his test results are back and if so can we call him. Wyatt Sandoval

## 2015-04-23 NOTE — Telephone Encounter (Signed)
Will forward to MD to advise. Jazmin Hartsell,CMA  

## 2015-04-23 NOTE — Telephone Encounter (Signed)
Pt is calling again about his test results

## 2015-04-24 NOTE — Telephone Encounter (Signed)
Needs refill amitriptyline Also wants to know results from last Fridays tests

## 2015-04-25 NOTE — Telephone Encounter (Signed)
pt informed that refills were sent in. Fleeger, Salome Spotted, CMA

## 2015-04-25 NOTE — Telephone Encounter (Signed)
Pt returned call.  Informed of results. Demarrion Meiklejohn, Salome Spotted, CMA

## 2015-04-25 NOTE — Telephone Encounter (Signed)
LMOVM for pt to return call .Sharley Keeler Dawn  

## 2015-04-25 NOTE — Telephone Encounter (Signed)
I did not obtain any labs for this patient. If he is wanting results form STI testing, it appears results were negative for infections. Please inform patient.

## 2015-04-29 ENCOUNTER — Other Ambulatory Visit: Payer: Self-pay | Admitting: Family Medicine

## 2015-05-13 ENCOUNTER — Ambulatory Visit: Payer: Medicare Other | Admitting: Family Medicine

## 2015-06-28 ENCOUNTER — Ambulatory Visit (HOSPITAL_BASED_OUTPATIENT_CLINIC_OR_DEPARTMENT_OTHER): Payer: Medicare Other | Attending: Nurse Practitioner

## 2015-06-28 VITALS — Ht 67.0 in | Wt 200.0 lb

## 2015-06-28 DIAGNOSIS — Z6831 Body mass index (BMI) 31.0-31.9, adult: Secondary | ICD-10-CM | POA: Insufficient documentation

## 2015-06-28 DIAGNOSIS — R5383 Other fatigue: Secondary | ICD-10-CM | POA: Diagnosis not present

## 2015-06-28 DIAGNOSIS — R0683 Snoring: Secondary | ICD-10-CM | POA: Diagnosis present

## 2015-06-28 DIAGNOSIS — Z79899 Other long term (current) drug therapy: Secondary | ICD-10-CM | POA: Diagnosis not present

## 2015-06-28 DIAGNOSIS — E669 Obesity, unspecified: Secondary | ICD-10-CM | POA: Insufficient documentation

## 2015-06-28 DIAGNOSIS — I10 Essential (primary) hypertension: Secondary | ICD-10-CM | POA: Diagnosis not present

## 2015-06-28 DIAGNOSIS — E119 Type 2 diabetes mellitus without complications: Secondary | ICD-10-CM | POA: Insufficient documentation

## 2015-06-30 DIAGNOSIS — R0683 Snoring: Secondary | ICD-10-CM | POA: Diagnosis not present

## 2015-06-30 NOTE — Progress Notes (Signed)
  Patient Name: Wyatt, Sandoval Date: 06/28/2015 Gender: Male D.O.B: 08/14/1960 Age (years): 54 Referring Provider: Joyce Copa Height (inches): 41 Interpreting Physician: Baird Lyons MD, ABSM Weight (lbs): 200 RPSGT: Baxter Flattery BMI: 31 MRN: [id number] Neck Size: 17.00 CLINICAL INFORMATION Sleep Study Type: NPSG   Indication for sleep study: Fatigue, Hypertension, Obesity, Snoring   Epworth Sleepiness Score: 1/24   SLEEP STUDY TECHNIQUE As per the AASM Manual for the Scoring of Sleep and Associated Events v2.3 (April 2016) with a hypopnea requiring 4% desaturations. The channels recorded and monitored were frontal, central and occipital EEG, electrooculogram (EOG), submentalis EMG (chin), nasal and oral airflow, thoracic and abdominal wall motion, anterior tibialis EMG, snore microphone, electrocardiogram, and pulse oximetry.  MEDICATIONS Patient's medications include: charted for review Medications self-administered by patient during sleep study : trazodone  SLEEP ARCHITECTURE The study was initiated at 11:10:38 PM and ended at 5:15:38 AM. Sleep onset time was 9.3 minutes and the sleep efficiency was 80.0%. The total sleep time was 292.2 minutes. Stage REM latency was 271.5 minutes. The patient spent 5.65% of the night in stage N1 sleep, 83.91% in stage N2 sleep, 0.00% in stage N3 and 10.44% in REM. Alpha intrusion was absent. Supine sleep was 15.78%Wake after sleep onset  RESPIRATORY PARAMETERS The overall apnea/hypopnea index (AHI) was 4.1 per hour. There were 1 total apneas, including 0 obstructive, 1 central and 0 mixed apneas. There were 19 hypopneas and 0 RERAs. The AHI during Stage REM sleep was 17.7 per hour. AHI while supine was 6.5 per hour. The mean oxygen saturation was 91.21%. The minimum SpO2 during sleep was 87.00%. Loud snoring was noted during this study.  CARDIAC DATA The 2 lead EKG demonstrated sinus rhythm. The mean heart rate was  90.73 beats per minute. Other EKG findings include: None.  LEG MOVEMENT DATA The total PLMS were 1 with a resulting PLMS index of 0.21. Associated arousal with leg movement index was 0.0 .  IMPRESSIONS - No significant obstructive sleep apnea occurred during this study (AHI = 4.1/h). - No significant central sleep apnea occurred during this study (CAI = 0.2/h). - Mild oxygen desaturation was noted during this study (Min O2 = 87.00%). - The patient snored with Loud snoring volume. - No cardiac abnormalities were noted during this study. - Clinically significant periodic limb movements did not occur during sleep. No significant associated arousals.  DIAGNOSIS - Primary Snoring (786.09 [R06.83 ICD-10]) - Normal study  RECOMMENDATIONS - Avoid alcohol, sedatives and other CNS depressants that may worsen sleep apnea and disrupt normal sleep architecture. - Sleep hygiene should be reviewed to assess factors that may improve sleep quality. - Weight management and regular exercise should be initiated or continued if appropriate.  Deneise Lever Diplomate, American Board of Sleep Medicine  ELECTRONICALLY SIGNED ON:  06/30/2015, 10:35 AM [place] PH: [phone]   FX: [phone] [place]

## 2015-07-08 ENCOUNTER — Other Ambulatory Visit: Payer: Self-pay | Admitting: Family Medicine

## 2015-07-13 ENCOUNTER — Other Ambulatory Visit: Payer: Self-pay | Admitting: Family Medicine

## 2015-07-15 ENCOUNTER — Other Ambulatory Visit: Payer: Self-pay | Admitting: *Deleted

## 2015-07-15 ENCOUNTER — Other Ambulatory Visit: Payer: Self-pay | Admitting: Family Medicine

## 2015-07-15 MED ORDER — ENALAPRIL MALEATE 5 MG PO TABS: 5.0000 mg | ORAL_TABLET | Freq: Every day | ORAL | Status: DC

## 2015-07-25 ENCOUNTER — Other Ambulatory Visit: Payer: Self-pay | Admitting: Family Medicine

## 2015-08-06 ENCOUNTER — Encounter (HOSPITAL_COMMUNITY): Payer: Self-pay

## 2015-08-06 ENCOUNTER — Emergency Department (HOSPITAL_COMMUNITY): Payer: Medicare Other

## 2015-08-06 DIAGNOSIS — M79602 Pain in left arm: Secondary | ICD-10-CM | POA: Diagnosis not present

## 2015-08-06 DIAGNOSIS — R0602 Shortness of breath: Secondary | ICD-10-CM | POA: Insufficient documentation

## 2015-08-06 DIAGNOSIS — R079 Chest pain, unspecified: Secondary | ICD-10-CM | POA: Diagnosis present

## 2015-08-06 DIAGNOSIS — I10 Essential (primary) hypertension: Secondary | ICD-10-CM | POA: Diagnosis not present

## 2015-08-06 DIAGNOSIS — E119 Type 2 diabetes mellitus without complications: Secondary | ICD-10-CM | POA: Insufficient documentation

## 2015-08-06 LAB — CBC
HEMATOCRIT: 43.3 % (ref 39.0–52.0)
HEMOGLOBIN: 13.9 g/dL (ref 13.0–17.0)
MCH: 26.2 pg (ref 26.0–34.0)
MCHC: 32.1 g/dL (ref 30.0–36.0)
MCV: 81.5 fL (ref 78.0–100.0)
Platelets: 233 10*3/uL (ref 150–400)
RBC: 5.31 MIL/uL (ref 4.22–5.81)
RDW: 14.7 % (ref 11.5–15.5)
WBC: 8.7 10*3/uL (ref 4.0–10.5)

## 2015-08-06 LAB — BASIC METABOLIC PANEL
ANION GAP: 13 (ref 5–15)
BUN: 19 mg/dL (ref 6–20)
CALCIUM: 9.8 mg/dL (ref 8.9–10.3)
CO2: 23 mmol/L (ref 22–32)
Chloride: 98 mmol/L — ABNORMAL LOW (ref 101–111)
Creatinine, Ser: 1.27 mg/dL — ABNORMAL HIGH (ref 0.61–1.24)
Glucose, Bld: 120 mg/dL — ABNORMAL HIGH (ref 65–99)
POTASSIUM: 4.3 mmol/L (ref 3.5–5.1)
SODIUM: 134 mmol/L — AB (ref 135–145)

## 2015-08-06 LAB — I-STAT TROPONIN, ED: TROPONIN I, POC: 0.01 ng/mL (ref 0.00–0.08)

## 2015-08-06 NOTE — ED Notes (Signed)
Onset 1 month left chest pain, left arm pain and shortness of breath.  Symptoms has been more frequent and worse x 3 days.  NAD at triage.

## 2015-08-07 ENCOUNTER — Ambulatory Visit (INDEPENDENT_AMBULATORY_CARE_PROVIDER_SITE_OTHER): Payer: Medicare Other | Admitting: Family Medicine

## 2015-08-07 ENCOUNTER — Encounter: Payer: Self-pay | Admitting: Family Medicine

## 2015-08-07 ENCOUNTER — Ambulatory Visit (HOSPITAL_COMMUNITY)
Admission: RE | Admit: 2015-08-07 | Discharge: 2015-08-07 | Disposition: A | Discharge status: Home / Self Care | Payer: Medicare Other | Source: Ambulatory Visit | Attending: Family Medicine | Admitting: Family Medicine

## 2015-08-07 ENCOUNTER — Emergency Department (HOSPITAL_COMMUNITY)
Admission: EM | Admit: 2015-08-07 | Discharge: 2015-08-07 | Disposition: A | Discharge status: Home / Self Care | Payer: Medicare Other | Attending: Emergency Medicine | Admitting: Emergency Medicine

## 2015-08-07 VITALS — BP 124/70 | HR 90 | Temp 97.8°F | Wt 204.0 lb

## 2015-08-07 DIAGNOSIS — R079 Chest pain, unspecified: Secondary | ICD-10-CM | POA: Diagnosis present

## 2015-08-07 DIAGNOSIS — R0602 Shortness of breath: Secondary | ICD-10-CM | POA: Diagnosis not present

## 2015-08-07 DIAGNOSIS — M79602 Pain in left arm: Secondary | ICD-10-CM | POA: Diagnosis not present

## 2015-08-07 DIAGNOSIS — I10 Essential (primary) hypertension: Secondary | ICD-10-CM | POA: Diagnosis not present

## 2015-08-07 MED ORDER — NITROGLYCERIN 0.4 MG SL SUBL: 0.4000 mg | SUBLINGUAL_TABLET | SUBLINGUAL | Status: DC | PRN

## 2015-08-07 NOTE — ED Notes (Signed)
Pt asked to leave. Pt stated that "the wait was too long". Pt had been here approximately 2 hours and 14 minutes. Pt stated that he "will try to see his doctor tomorrow". Informed Melanie - RN.

## 2015-08-07 NOTE — Progress Notes (Signed)
    Subjective   Wyatt Sandoval is a 55 y.o. male that presents for a same day visit  1. Chest pain: Symptoms started a few months ago but has worsened in the last week. He describes the pain as pinching. Pain improves within an hour. He has associated increased dyspnea with exertion. He reports some dyspnea when he sits still as well. He has intermittent mildly productive cough. He reports associated palpitations. Pain radiates to left shoulder as well. He continues to smoke cigarettes but has not smoked since last week. No recent trauma or injuries. He has mild pain and dyspnea currently. He has performed a stress test at least 15 years ago when he was in [place], and is unsure if that was normal. Patient adherent with metoprolol. He has not been taking his aspirin.   ROS Per HPI  Social History  Substance Use Topics  . Smoking status: Former Smoker -- 1.00 packs/day    Types: Cigarettes  . Smokeless tobacco: Never Used  . Alcohol Use: 0.0 oz/week    0 Standard drinks or equivalent per week     Comment: once or twice a year    Allergies  Allergen Reactions  . Bupropion Other (See Comments)    Memory and thinking impairment - dysphoria    Objective   BP 124/70 mmHg  Pulse 90  Temp(Src) 97.8 F (36.6 C) (Oral)  Wt 204 lb (92.534 kg)  SpO2 97%  General: Well appearing, no distress Respiratory/Chest: Clear to auscultation bilaterally. Unlabored work of breathing. No wheezing or rales. No chest wall tenderness Cardiovascular: Regular rate and rhythm. Normal S1 and S2. No heart murmurs present. No extra heart sounds  Assessment and Plan   1. Chest pain, unspecified chest pain type Likely stable, although, somewhat unclear if baseline chest pain can put in the category of unstable. Discussed EKG results with cardiologist, Dr. Martinique, who felt changes more likely related to repolarization abnormalities. Will refer patient to cardiology for follow-up. Nitro prescribed. Continue  beta blocker and ACEi. Return precautions discussed. Will refer for PFTs in office. - EKG 12-Lead - nitroGLYCERIN (NITROSTAT) 0.4 MG SL tablet; Place 1 tablet (0.4 mg total) under the tongue every 5 (five) minutes as needed for chest pain.  Dispense: 10 tablet; Refill: 3

## 2015-08-07 NOTE — Patient Instructions (Addendum)
Thank you for coming to see me today. It was a pleasure. Today we talked about:   Chest pain: I am prescribing nitroglycerin tablets. Please take these for your chest pain. Take one every 5 minutes. If you have to take three of them, please get transported to the ED (please have someone drive you). I will refer you to cardiology for further management. Also, please make an appointment to see Dr. Valentina Lucks for pulmonary function testing.  Please make an appointment to see me in 2-4 weeks for follow-up.  If you have any questions or concerns, please do not hesitate to call the office at [phone].  Sincerely,  Cordelia Poche, MD

## 2015-08-07 NOTE — ED Notes (Signed)
Pt left before this RN could speak with him about risks of leaving before seeing the ER physician.

## 2015-08-13 ENCOUNTER — Ambulatory Visit (INDEPENDENT_AMBULATORY_CARE_PROVIDER_SITE_OTHER): Payer: Medicare Other | Admitting: Pharmacist

## 2015-08-13 ENCOUNTER — Encounter: Payer: Self-pay | Admitting: Pharmacist

## 2015-08-13 VITALS — BP 136/81 | HR 88 | Ht 67.0 in | Wt 214.7 lb

## 2015-08-13 DIAGNOSIS — R0602 Shortness of breath: Secondary | ICD-10-CM | POA: Diagnosis not present

## 2015-08-13 DIAGNOSIS — F17200 Nicotine dependence, unspecified, uncomplicated: Secondary | ICD-10-CM

## 2015-08-13 NOTE — Progress Notes (Signed)
S:    Patient arrives in good spirits and ambulating on his own. Presents for lung function evaluation. Patient was referred by Dr. Lonny Prude. Patient was last seen by Primary Care Provider on 08/07/2015.  Patient reports shortness of breath with mild pain. He states "it feels like something was pinching at my heart".    His recent bout with SOB and chest pain caused the patient to quit smoking. He reports that he can't predict the future when asked in his confidence to not start smoking again. He has not smoked a cigarette in the past 2 weeks.   O: See "scanned report" or Documentation Flowsheet (discrete results - PFTs) for  Spirometry results. Patient provided good effort while attempting spirometry.    He reported that he used to smoke one pack per day (37 Pack years).   A/P:  Patient has been experiencing SOB and chest pain. Spirometry evaluation reveals normal lung function. His best FEV1% was 84%. He was prescribed nitroglycerin (Nitrostat) 0.4 mg as needed for his chest pain. Reviewed results of pulmonary function tests. Patient verbalized understanding of results and education. No additional medication adjustments were made for dyspnea. Written pt instructions provided.  F/U Clinic visit in May for a stress test. Total time in face to face counseling 30 minutes.  Pt is a moderate smoker who has quit two weeks ago. Pt has d/c nicotine patches as well as varenicline (Chantix) since he has stropped smoking. Encourage continued abscence of smoking. His current commitment to long term quitting is fair.    Patient seen with Rudolpho Sevin, PharmD Candidate and Governor Specking, PharmD Resident.

## 2015-08-13 NOTE — Assessment & Plan Note (Signed)
Patient has been experiencing SOB and chest pain. Spirometry evaluation reveals normal lung function. His best FEV1% was 84%. He was prescribed nitroglycerin (Nitrostat) 0.4 mg as needed for his chest pain. Reviewed results of pulmonary function tests. Patient verbalized understanding of results and education. No additional medication adjustments were made for dyspnea. Written pt instructions provided.  F/U Clinic visit in May for a stress test. Total time in face to face counseling 30 minutes.  Pt is a moderate smoker who has quit two weeks ago. Pt has d/c nicotine patches as well as varenicline (Chantix) since he has stropped smoking. Encourage continued abscence of smoking. His current commitment to long term quitting is fair.

## 2015-08-13 NOTE — Patient Instructions (Signed)
Thank you for coming in to see [place]!  Yourl PFT was normal. Continue with the follow up plan of attending your stress scheduled for later this month.

## 2015-08-13 NOTE — Progress Notes (Signed)
Patient ID: Wyatt Sandoval, male   DOB: Jan 25, 1961, 55 y.o.   MRN: [id number] Reviewed.  Agree with Dr. Graylin Shiver documentation and management.

## 2015-08-19 ENCOUNTER — Other Ambulatory Visit: Payer: Self-pay | Admitting: *Deleted

## 2015-08-19 DIAGNOSIS — K219 Gastro-esophageal reflux disease without esophagitis: Secondary | ICD-10-CM

## 2015-08-22 ENCOUNTER — Encounter: Payer: Self-pay | Admitting: Internal Medicine

## 2015-08-22 ENCOUNTER — Ambulatory Visit (INDEPENDENT_AMBULATORY_CARE_PROVIDER_SITE_OTHER): Payer: Medicare Other | Admitting: Internal Medicine

## 2015-08-22 ENCOUNTER — Encounter: Payer: Self-pay | Admitting: *Deleted

## 2015-08-22 VITALS — BP 126/68 | HR 94 | Ht 67.0 in | Wt 210.0 lb

## 2015-08-22 DIAGNOSIS — R079 Chest pain, unspecified: Secondary | ICD-10-CM | POA: Diagnosis not present

## 2015-08-22 DIAGNOSIS — Z01812 Encounter for preprocedural laboratory examination: Secondary | ICD-10-CM

## 2015-08-22 LAB — CBC
HEMATOCRIT: 43.4 % (ref 38.5–50.0)
Hemoglobin: 14.4 g/dL (ref 13.2–17.1)
MCH: 26.4 pg — ABNORMAL LOW (ref 27.0–33.0)
MCHC: 33.2 g/dL (ref 32.0–36.0)
MCV: 79.5 fL — AB (ref 80.0–100.0)
MPV: 10.2 fL (ref 7.5–12.5)
PLATELETS: 268 10*3/uL (ref 140–400)
RBC: 5.46 MIL/uL (ref 4.20–5.80)
RDW: 15 % (ref 11.0–15.0)
WBC: 7.6 10*3/uL (ref 3.8–10.8)

## 2015-08-22 LAB — BASIC METABOLIC PANEL
BUN: 11 mg/dL (ref 7–25)
CHLORIDE: 101 mmol/L (ref 98–110)
CO2: 25 mmol/L (ref 20–31)
CREATININE: 1.18 mg/dL (ref 0.70–1.33)
Calcium: 9.9 mg/dL (ref 8.6–10.3)
Glucose, Bld: 108 mg/dL — ABNORMAL HIGH (ref 65–99)
POTASSIUM: 4.5 mmol/L (ref 3.5–5.3)
Sodium: 138 mmol/L (ref 135–146)

## 2015-08-22 LAB — PROTIME-INR
INR: 0.97 (ref <1.50)
Prothrombin Time: 13 seconds (ref 11.6–15.2)

## 2015-08-22 NOTE — Progress Notes (Signed)
 Cardiology Office Note   Date:  08/22/2015   ID:  Wyatt Sandoval, DOB 02/25/1961, MRN [id number]  PCP:  Ralph Nettey, MD  Cardiologist:   Paula Ross, MD   Pt referred for CP     History of Present Illness: Wyatt Sandoval is a 55 y.o. male with a history of chest pain  Seen by Dr Nettey  Pt also with DOE  Also dyspnea with sitting  Pain goes to L shoulder   Stress test 15 yers ago   Pt works part time  Janitor work  No sympotms really at work  But he has slowed down  Used to be able to race through  Worse when gets home    Sweating bad at work  Some slowed down   Used to be a couple months ago that could run and rush  Now can't  Qit tobacco early this month  Started smoking 1 ppd at age 17     Glu running 120ss   FHX  Grand parents with heart problmes  Parents not  Sibs :  Sister may have had ICD placed  NO recoreds    Outpatient Prescriptions Prior to Visit  Medication Sig Dispense Refill  . amitriptyline (ELAVIL) 150 MG tablet TAKE 1 TABLET BY MOUTH AT BEDTIME 90 tablet 0  . aspirin 81 MG tablet Take 1 tablet (81 mg total) by mouth daily. 30 tablet 11  . Blood Glucose Monitoring Suppl (ONE TOUCH ULTRA 2) W/DEVICE KIT by Does not apply route.     . enalapril (VASOTEC) 5 MG tablet Take 1 tablet (5 mg total) by mouth daily. 90 tablet 3  . fish oil-omega-3 fatty acids 1000 MG capsule Take 2 g by mouth daily.      . gabapentin (NEURONTIN) 300 MG capsule Take 300 mg by mouth daily.    . glucose blood (ONE TOUCH ULTRA TEST) test strip Use as instructed 35 each 3  . ketoconazole (NIZORAL) 2 % cream Apply topically 2 (two) times daily. 60 g 0  . LUMIGAN 0.01 % SOLN INT 1 DROP INTO BOTH EYES NIGHTLY  11  . metFORMIN (GLUCOPHAGE) 1000 MG tablet TAKE 1 TABLET BY MOUTH TWICE DAILY WITH A MEAL 180 tablet 0  . metoprolol tartrate (LOPRESSOR) 25 MG tablet TAKE 1/2 TABLET BY MOUTH TWICE DAILY 60 tablet 0  . nicotine (NICODERM CQ - DOSED IN MG/24 HOURS) 21 mg/24hr patch Place 1 patch onto the  skin daily. Reported on 08/13/2015    . nitroGLYCERIN (NITROSTAT) 0.4 MG SL tablet Place 1 tablet (0.4 mg total) under the tongue every 5 (five) minutes as needed for chest pain. 10 tablet 3  . omeprazole (PRILOSEC) 40 MG capsule Take 1 capsule (40 mg total) by mouth daily. 90 capsule 3  . oxyCODONE (OXYCONTIN) 15 MG TB12 Take 1 tablet (15 mg total) by mouth every 12 (twelve) hours. 60 tablet   . simvastatin (ZOCOR) 40 MG tablet Take 1 tablet (40 mg total) by mouth every evening. 90 tablet 0  . CHANTIX STARTING MONTH PAK 0.5 MG X 11 & 1 MG X 42 tablet TAKE A 0.5MG TABLET BY MOUTH ONCE DAILY FOR 3 DAYS,THEN A 0.5MG TABLET TWICE DAILY FOR 4 DAYS,THEN A 1MG TABLET TWICE DAILY (Patient not taking: Reported on 08/13/2015) 53 tablet 0  . triamcinolone ointment (KENALOG) 0.5 % Apply 1 application topically 2 (two) times daily. (Patient not taking: Reported on 08/13/2015) 30 g 0   No facility-administered medications prior to visit.       Allergies:   Bupropion   Past Medical History  Diagnosis Date  . Diabetes mellitus   . 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 Hyperlipidemia   . Hypertension   . Hepatitis C     Past Surgical History  Procedure Laterality Date  . Spinal fusion       Social History:  The patient  reports that he quit smoking about 3 weeks ago. His smoking use included Cigarettes. He started smoking about 36 years ago. He has a 37 pack-year smoking history. He has never used smokeless tobacco. He reports that he drinks alcohol. He reports that he does not use illicit drugs.   Family History:  The patient's family history includes Liver cancer in his brother. There is no history of Colon cancer or Esophageal cancer.    ROS:  Please see the history of present illness. All other systems are reviewed and  Negative to the above problem except as noted.    PHYSICAL EXAM: VS:  BP 126/68  mmHg  Pulse 94  Ht 5' 7" (1.702 m)  Wt 210 lb (95.255 kg)  BMI 32.88 kg/m2  GEN: Well nourished, well developed, in no acute distress HEENT: normal Neck: no JVD, carotid bruits, or masses Cardiac: RRR; no murmurs, rubs, or gallops,no edema  Respiratory:  clear to auscultation bilaterally, normal work of breathing GI: soft, nontender, nondistended, + BS  No hepatomegaly  MS: no deformity Moving all extremities   Skin: warm and dry, no rash Neuro:  Strength and sensation are intact Psych: euthymic mood, full affect   EKG:  EKG is not ordered today.  On 08/06/15 ST 111 bpm     Lipid Panel    Component Value Date/Time   CHOL 203* 04/17/2015 1225   TRIG 241* 04/17/2015 1225   HDL 51 04/17/2015 1225   CHOLHDL 4.0 04/17/2015 1225   VLDL 48* 04/17/2015 1225   LDLCALC 104 04/17/2015 1225      Wt Readings from Last 3 Encounters:  08/22/15 210 lb (95.255 kg)  08/13/15 214 lb 11.2 oz (97.387 kg)  08/07/15 204 lb (92.534 kg)      ASSESSMENT AND PLAN:  1 Chest pressure  Concerning  Some parts are atypcial  But he has slowed down and he is eating NTG more to help   I reviwed stress testing vs cath  Ptneeds to know   Risks benefits to cath explained  Pt understands and agrees to proceed   Will get labs today  2.  Sister  ? Sudden death  Need records   3.  Tob  Congratulated on cessation  Encouraged him to continue to stay off of tobacco   Would recomm ec ASA 81 mg per day      Signed, Paula Ross, MD  08/22/2015 11:19 AM    [facility] [address], [place], [place]  [address] Phone: [phone]; Fax: [phone]     

## 2015-08-22 NOTE — Patient Instructions (Signed)
Your physician recommends that you continue on your current medications as directed. Please refer to the Current Medication list given to you today.  Your physician recommends that you return for lab work in: today (CBC, BMET, INR/PR)  Your physician has requested that you have a cardiac catheterization. Cardiac catheterization is used to diagnose and/or treat various heart conditions. Doctors may recommend this procedure for a number of different reasons. The most common reason is to evaluate chest pain. Chest pain can be a symptom of coronary artery disease (CAD), and cardiac catheterization can show whether plaque is narrowing or blocking your heart's arteries. This procedure is also used to evaluate the valves, as well as measure the blood flow and oxygen levels in different parts of your heart. For further information please visit [email or web address]. Please follow instruction sheet, as given.

## 2015-08-26 ENCOUNTER — Ambulatory Visit (HOSPITAL_COMMUNITY)
Admission: RE | Admit: 2015-08-26 | Discharge: 2015-08-26 | Disposition: A | Discharge status: Home / Self Care | Payer: Medicare Other | Source: Ambulatory Visit | Attending: Interventional Cardiology | Admitting: Interventional Cardiology

## 2015-08-26 ENCOUNTER — Encounter (HOSPITAL_COMMUNITY): Payer: Self-pay | Admitting: Interventional Cardiology

## 2015-08-26 ENCOUNTER — Encounter (HOSPITAL_COMMUNITY)
Admission: RE | Disposition: A | Discharge status: Home / Self Care | Payer: Self-pay | Source: Ambulatory Visit | Attending: Interventional Cardiology

## 2015-08-26 DIAGNOSIS — E119 Type 2 diabetes mellitus without complications: Secondary | ICD-10-CM | POA: Diagnosis not present

## 2015-08-26 DIAGNOSIS — Z87891 Personal history of nicotine dependence: Secondary | ICD-10-CM | POA: Diagnosis not present

## 2015-08-26 DIAGNOSIS — I2510 Atherosclerotic heart disease of native coronary artery without angina pectoris: Secondary | ICD-10-CM | POA: Insufficient documentation

## 2015-08-26 DIAGNOSIS — E785 Hyperlipidemia, unspecified: Secondary | ICD-10-CM | POA: Diagnosis not present

## 2015-08-26 DIAGNOSIS — G8921 Chronic pain due to trauma: Secondary | ICD-10-CM | POA: Insufficient documentation

## 2015-08-26 DIAGNOSIS — M549 Dorsalgia, unspecified: Secondary | ICD-10-CM | POA: Insufficient documentation

## 2015-08-26 DIAGNOSIS — R0609 Other forms of dyspnea: Secondary | ICD-10-CM | POA: Insufficient documentation

## 2015-08-26 DIAGNOSIS — I10 Essential (primary) hypertension: Secondary | ICD-10-CM | POA: Diagnosis not present

## 2015-08-26 DIAGNOSIS — Z7984 Long term (current) use of oral hypoglycemic drugs: Secondary | ICD-10-CM | POA: Insufficient documentation

## 2015-08-26 DIAGNOSIS — R072 Precordial pain: Secondary | ICD-10-CM | POA: Insufficient documentation

## 2015-08-26 DIAGNOSIS — B1920 Unspecified viral hepatitis C without hepatic coma: Secondary | ICD-10-CM | POA: Diagnosis not present

## 2015-08-26 DIAGNOSIS — Z7982 Long term (current) use of aspirin: Secondary | ICD-10-CM | POA: Diagnosis not present

## 2015-08-26 HISTORY — PX: CARDIAC CATHETERIZATION: SHX172

## 2015-08-26 LAB — GLUCOSE, CAPILLARY
Glucose-Capillary: 161 mg/dL — ABNORMAL HIGH (ref 65–99)
Glucose-Capillary: 182 mg/dL — ABNORMAL HIGH (ref 65–99)

## 2015-08-26 SURGERY — LEFT HEART CATH AND CORONARY ANGIOGRAPHY
Anesthesia: LOCAL

## 2015-08-26 MED ORDER — SODIUM CHLORIDE 0.9 % IV SOLN
250.0000 mL | INTRAVENOUS | Status: DC | PRN
Start: 2015-08-26 — End: 2015-08-26

## 2015-08-26 MED ORDER — ASPIRIN 81 MG PO CHEW: 81.0000 mg | CHEWABLE_TABLET | ORAL | Status: DC

## 2015-08-26 MED ORDER — VERAPAMIL HCL 2.5 MG/ML IV SOLN
INTRAVENOUS | Status: AC
  Filled 2015-08-26: qty 2

## 2015-08-26 MED ORDER — FENTANYL CITRATE (PF) 100 MCG/2ML IJ SOLN
INTRAMUSCULAR | Status: AC
  Filled 2015-08-26: qty 2

## 2015-08-26 MED ORDER — HEPARIN SODIUM (PORCINE) 1000 UNIT/ML IJ SOLN
INTRAMUSCULAR | Status: DC | PRN
  Administered 2015-08-26: 5000 [IU] via INTRAVENOUS

## 2015-08-26 MED ORDER — IOPAMIDOL (ISOVUE-370) INJECTION 76%
INTRAVENOUS | Status: DC | PRN
  Administered 2015-08-26: 90 mL

## 2015-08-26 MED ORDER — VERAPAMIL HCL 2.5 MG/ML IV SOLN
INTRAVENOUS | Status: DC | PRN
  Administered 2015-08-26: 10 mL via INTRA_ARTERIAL

## 2015-08-26 MED ORDER — METFORMIN HCL 1000 MG PO TABS: ORAL_TABLET | ORAL | Status: DC

## 2015-08-26 MED ORDER — SODIUM CHLORIDE 0.9 % WEIGHT BASED INFUSION
3.0000 mL/kg/h | INTRAVENOUS | Status: AC
  Administered 2015-08-26: 3 mL/kg/h via INTRAVENOUS

## 2015-08-26 MED ORDER — HEPARIN (PORCINE) IN NACL 2-0.9 UNIT/ML-% IJ SOLN
INTRAMUSCULAR | Status: AC
  Filled 2015-08-26: qty 1500

## 2015-08-26 MED ORDER — FENTANYL CITRATE (PF) 100 MCG/2ML IJ SOLN
INTRAMUSCULAR | Status: DC | PRN
  Administered 2015-08-26: 50 ug via INTRAVENOUS

## 2015-08-26 MED ORDER — SODIUM CHLORIDE 0.9% FLUSH: 3.0000 mL | Freq: Two times a day (BID) | INTRAVENOUS | Status: DC

## 2015-08-26 MED ORDER — HEPARIN (PORCINE) IN NACL 2-0.9 UNIT/ML-% IJ SOLN
INTRAMUSCULAR | Status: DC | PRN
  Administered 2015-08-26: 09:00:00

## 2015-08-26 MED ORDER — HEPARIN SODIUM (PORCINE) 1000 UNIT/ML IJ SOLN
INTRAMUSCULAR | Status: AC
  Filled 2015-08-26: qty 1

## 2015-08-26 MED ORDER — SODIUM CHLORIDE 0.9% FLUSH: 3.0000 mL | INTRAVENOUS | Status: DC | PRN

## 2015-08-26 MED ORDER — SODIUM CHLORIDE 0.9 % WEIGHT BASED INFUSION: 1.0000 mL/kg/h | INTRAVENOUS | Status: DC

## 2015-08-26 MED ORDER — LIDOCAINE HCL (PF) 1 % IJ SOLN
INTRAMUSCULAR | Status: AC
Start: 2015-08-26 — End: 2015-08-26
  Filled 2015-08-26: qty 30

## 2015-08-26 MED ORDER — IOPAMIDOL (ISOVUE-370) INJECTION 76%
INTRAVENOUS | Status: AC
  Filled 2015-08-26: qty 100

## 2015-08-26 MED ORDER — SODIUM CHLORIDE 0.9 % IV SOLN: 250.0000 mL | INTRAVENOUS | Status: DC | PRN

## 2015-08-26 MED ORDER — LIDOCAINE HCL (PF) 1 % IJ SOLN
INTRAMUSCULAR | Status: DC | PRN
  Administered 2015-08-26: 2 mL

## 2015-08-26 MED ORDER — MIDAZOLAM HCL 2 MG/2ML IJ SOLN
INTRAMUSCULAR | Status: DC | PRN
  Administered 2015-08-26: 2 mg via INTRAVENOUS

## 2015-08-26 MED ORDER — MIDAZOLAM HCL 2 MG/2ML IJ SOLN
INTRAMUSCULAR | Status: AC
  Filled 2015-08-26: qty 2

## 2015-08-26 SURGICAL SUPPLY — 13 items
CATH INFINITI 5 FR JL3.5 (CATHETERS) ×2 IMPLANT
CATH INFINITI 5FR ANG PIGTAIL (CATHETERS) ×2 IMPLANT
CATH INFINITI JR4 5F (CATHETERS) ×2 IMPLANT
CATH LAUNCHER 5F EBU3.0 (CATHETERS) ×1 IMPLANT
CATHETER LAUNCHER 5F EBU3.0 (CATHETERS) ×2
DEVICE RAD COMP TR BAND LRG (VASCULAR PRODUCTS) ×2 IMPLANT
GLIDESHEATH SLEND SS 6F .021 (SHEATH) ×2 IMPLANT
KIT HEART LEFT (KITS) ×2 IMPLANT
PACK CARDIAC CATHETERIZATION (CUSTOM PROCEDURE TRAY) ×2 IMPLANT
SYR MEDRAD MARK V 150ML (SYRINGE) ×2 IMPLANT
TRANSDUCER W/STOPCOCK (MISCELLANEOUS) ×2 IMPLANT
TUBING CIL FLEX 10 FLL-RA (TUBING) ×2 IMPLANT
WIRE SAFE-T 1.5MM-J .035X260CM (WIRE) ×2 IMPLANT

## 2015-08-26 NOTE — H&P (View-Only) (Signed)
Cardiology Office Note   Date:  08/22/2015   ID:  Wyatt Sandoval, DOB January 07, 1961, MRN [id number]  PCP:  Cordelia Poche, MD  Cardiologist:   Dorris Carnes, MD   Pt referred for CP     History of Present Illness: Wyatt Sandoval is a 56 y.o. male with a history of chest pain  Seen by Dr Lonny Prude  Pt also with DOE  Also dyspnea with sitting  Pain goes to L shoulder   Stress test 15 yers ago   Pt works part time  Retail buyer work  No sympotms really at work  But he has slowed down  Used to be able to race through  Worse when gets home    Sweating bad at work  Some slowed down   Used to be a couple months ago that could run and rush  Now can't  Qit tobacco early this month  Started smoking 1 ppd at age 16     Glu running 22ss   [place] parents with heart problmes  Parents not  Sibs :  Sister may have had ICD placed  NO recoreds    Outpatient Prescriptions Prior to Visit  Medication Sig Dispense Refill  . amitriptyline (ELAVIL) 150 MG tablet TAKE 1 TABLET BY MOUTH AT BEDTIME 90 tablet 0  . aspirin 81 MG tablet Take 1 tablet (81 mg total) by mouth daily. 30 tablet 11  . Blood Glucose Monitoring Suppl (ONE TOUCH ULTRA 2) W/DEVICE KIT by Does not apply route.     . enalapril (VASOTEC) 5 MG tablet Take 1 tablet (5 mg total) by mouth daily. 90 tablet 3  . fish oil-omega-3 fatty acids 1000 MG capsule Take 2 g by mouth daily.      Marland Kitchen gabapentin (NEURONTIN) 300 MG capsule Take 300 mg by mouth daily.    Marland Kitchen glucose blood (ONE TOUCH ULTRA TEST) test strip Use as instructed 35 each 3  . ketoconazole (NIZORAL) 2 % cream Apply topically 2 (two) times daily. 60 g 0  . LUMIGAN 0.01 % SOLN INT 1 DROP INTO BOTH EYES NIGHTLY  11  . metFORMIN (GLUCOPHAGE) 1000 MG tablet TAKE 1 TABLET BY MOUTH TWICE DAILY WITH A MEAL 180 tablet 0  . metoprolol tartrate (LOPRESSOR) 25 MG tablet TAKE 1/2 TABLET BY MOUTH TWICE DAILY 60 tablet 0  . nicotine (NICODERM CQ - DOSED IN MG/24 HOURS) 21 mg/24hr patch Place 1 patch onto the  skin daily. Reported on 08/13/2015    . nitroGLYCERIN (NITROSTAT) 0.4 MG SL tablet Place 1 tablet (0.4 mg total) under the tongue every 5 (five) minutes as needed for chest pain. 10 tablet 3  . omeprazole (PRILOSEC) 40 MG capsule Take 1 capsule (40 mg total) by mouth daily. 90 capsule 3  . oxyCODONE (OXYCONTIN) 15 MG TB12 Take 1 tablet (15 mg total) by mouth every 12 (twelve) hours. 60 tablet   . simvastatin (ZOCOR) 40 MG tablet Take 1 tablet (40 mg total) by mouth every evening. 90 tablet 0  . CHANTIX STARTING MONTH PAK 0.5 MG X 11 & 1 MG X 42 tablet TAKE A 0.5MG TABLET BY MOUTH ONCE DAILY FOR 3 DAYS,THEN A 0.5MG TABLET TWICE DAILY FOR 4 DAYS,THEN A 1MG TABLET TWICE DAILY (Patient not taking: Reported on 08/13/2015) 53 tablet 0  . triamcinolone ointment (KENALOG) 0.5 % Apply 1 application topically 2 (two) times daily. (Patient not taking: Reported on 08/13/2015) 30 g 0   No facility-administered medications prior to visit.  Allergies:   Bupropion   Past Medical History  Diagnosis Date  . Diabetes mellitus   . 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 Hyperlipidemia   . Hypertension   . Hepatitis C     Past Surgical History  Procedure Laterality Date  . Spinal fusion       Social History:  The patient  reports that he quit smoking about 3 weeks ago. His smoking use included Cigarettes. He started smoking about 36 years ago. He has a 37 pack-year smoking history. He has never used smokeless tobacco. He reports that he drinks alcohol. He reports that he does not use illicit drugs.   Family History:  The patient's family history includes Liver cancer in his brother. There is no history of Colon cancer or Esophageal cancer.    ROS:  Please see the history of present illness. All other systems are reviewed and  Negative to the above problem except as noted.    PHYSICAL EXAM: VS:  BP 126/68  mmHg  Pulse 94  Ht _0  (1.702 m)  Wt 210 lb (95.255 kg)  BMI 32.88 kg/m2  GEN: Well nourished, well developed, in no acute distress HEENT: normal Neck: no JVD, carotid bruits, or masses Cardiac: RRR; no murmurs, rubs, or gallops,no edema  Respiratory:  clear to auscultation bilaterally, normal work of breathing GI: soft, nontender, nondistended, + BS  No hepatomegaly  MS: no deformity Moving all extremities   Skin: warm and dry, no rash Neuro:  Strength and sensation are intact Psych: euthymic mood, full affect   EKG:  EKG is not ordered today.  On 08/06/15 ST 111 bpm     Lipid Panel    Component Value Date/Time   CHOL 203* 04/17/2015 1225   TRIG 241* 04/17/2015 1225   HDL 51 04/17/2015 1225   CHOLHDL 4.0 04/17/2015 1225   VLDL 48* 04/17/2015 1225   LDLCALC 104 04/17/2015 1225      Wt Readings from Last 3 Encounters:  08/22/15 210 lb (95.255 kg)  08/13/15 214 lb 11.2 oz (97.387 kg)  08/07/15 204 lb (92.534 kg)      ASSESSMENT AND PLAN:  1 Chest pressure  Concerning  Some parts are atypcial  But he has slowed down and he is eating NTG more to help   I reviwed stress testing vs cath  Ptneeds to know   Risks benefits to cath explained  Pt understands and agrees to proceed   Will get labs today  2.  Sister  ? Sudden death  Need records   3.  Tob  Congratulated on cessation  Encouraged him to continue to stay off of tobacco   Would recomm ec ASA 81 mg per day      Signed, Dorris Carnes, MD  08/22/2015 11:19 AM    [address] Sandoval, [place], [place]  [address] Phone: [phone]; Fax: [phone]

## 2015-08-26 NOTE — Interval H&P Note (Signed)
Cath Lab Visit (complete for each Cath Lab visit)  Clinical Evaluation Leading to the Procedure:   ACS: No.  Non-ACS:    Anginal Classification: CCS III  Anti-ischemic medical therapy: Minimal Therapy (1 class of medications)  Non-Invasive Test Results: No non-invasive testing performed  Prior CABG: No previous CABG   Accelerating angina.   History and Physical Interval Note:  08/26/2015 7:53 AM  Wyatt Sandoval  has presented today for surgery, with the diagnosis of cp  The various methods of treatment have been discussed with the patient and family. After consideration of risks, benefits and other options for treatment, the patient has consented to  Procedure(s): Left Heart Cath and Coronary Angiography (N/A) as a surgical intervention .  The patient's history has been reviewed, patient examined, no change in status, stable for surgery.  I have reviewed the patient's chart and labs.  Questions were answered to the patient's satisfaction.     VARANASI,JAYADEEP S.

## 2015-08-26 NOTE — Discharge Instructions (Signed)
NO METFORMIN/GLUCOPHAGE FOR 2 DAYS ° ° ° °Radial Site Care °Refer to this sheet in the next few weeks. These instructions provide you with information about caring for yourself after your procedure. Your health care provider may also give you more specific instructions. Your treatment has been planned according to current medical practices, but problems sometimes occur. Call your health care provider if you have any problems or questions after your procedure. °WHAT TO EXPECT AFTER THE PROCEDURE °After your procedure, it is typical to have the following: °· Bruising at the radial site that usually fades within 1-2 weeks. °· Blood collecting in the tissue (hematoma) that may be painful to the touch. It should usually decrease in size and tenderness within 1-2 weeks. °HOME CARE INSTRUCTIONS °· Take medicines only as directed by your health care provider. °· You may shower 24-48 hours after the procedure or as directed by your health care provider. Remove the bandage (dressing) and gently wash the site with plain soap and water. Pat the area dry with a clean towel. Do not rub the site, because this may cause bleeding. °· Do not take baths, swim, or use a hot tub until your health care provider approves. °· Check your insertion site every day for redness, swelling, or drainage. °· Do not apply powder or lotion to the site. °· Do not flex or bend the affected arm for 24 hours or as directed by your health care provider. °· Do not push or pull heavy objects with the affected arm for 24 hours or as directed by your health care provider. °· Do not lift over 10 lb (4.5 kg) for 5 days after your procedure or as directed by your health care provider. °· Ask your health care provider when it is okay to: °¨ Return to work or school. °¨ Resume usual physical activities or sports. °¨ Resume sexual activity. °· Do not drive home if you are discharged the same day as the procedure. Have someone else drive you. °· You may drive 24  hours after the procedure unless otherwise instructed by your health care provider. °· Do not operate machinery or power tools for 24 hours after the procedure. °· If your procedure was done as an outpatient procedure, which means that you went home the same day as your procedure, a responsible adult should be with you for the first 24 hours after you arrive home. °· Keep all follow-up visits as directed by your health care provider. This is important. °SEEK MEDICAL CARE IF: °· You have a fever. °· You have chills. °· You have increased bleeding from the radial site. Hold pressure on the site. °SEEK IMMEDIATE MEDICAL CARE IF: °· You have unusual pain at the radial site. °· You have redness, warmth, or swelling at the radial site. °· You have drainage (other than a small amount of blood on the dressing) from the radial site. °· The radial site is bleeding, and the bleeding does not stop after 30 minutes of holding steady pressure on the site. °· Your arm or hand becomes pale, cool, tingly, or numb. °  °This information is not intended to replace advice given to you by your health care provider. Make sure you discuss any questions you have with your health care provider. °  °Document Released: 05/23/2010 Document Revised: 05/11/2014 Document Reviewed: 11/06/2013 °Elsevier Interactive Patient Education ©2016 [employer] ° °

## 2015-09-06 ENCOUNTER — Encounter: Payer: Self-pay | Admitting: Family Medicine

## 2015-09-06 ENCOUNTER — Ambulatory Visit (INDEPENDENT_AMBULATORY_CARE_PROVIDER_SITE_OTHER): Payer: Medicare Other | Admitting: Physician Assistant

## 2015-09-06 ENCOUNTER — Ambulatory Visit (INDEPENDENT_AMBULATORY_CARE_PROVIDER_SITE_OTHER): Payer: Medicare Other | Admitting: Family Medicine

## 2015-09-06 ENCOUNTER — Encounter: Payer: Self-pay | Admitting: Physician Assistant

## 2015-09-06 VITALS — BP 132/88 | HR 88 | Temp 98.1°F | Ht 67.0 in | Wt 210.0 lb

## 2015-09-06 VITALS — BP 148/89 | HR 80 | Ht 67.0 in | Wt 212.4 lb

## 2015-09-06 DIAGNOSIS — R0609 Other forms of dyspnea: Secondary | ICD-10-CM

## 2015-09-06 DIAGNOSIS — G4700 Insomnia, unspecified: Secondary | ICD-10-CM

## 2015-09-06 DIAGNOSIS — E119 Type 2 diabetes mellitus without complications: Secondary | ICD-10-CM

## 2015-09-06 DIAGNOSIS — R0989 Other specified symptoms and signs involving the circulatory and respiratory systems: Secondary | ICD-10-CM | POA: Diagnosis not present

## 2015-09-06 DIAGNOSIS — Z01812 Encounter for preprocedural laboratory examination: Secondary | ICD-10-CM

## 2015-09-06 DIAGNOSIS — R079 Chest pain, unspecified: Secondary | ICD-10-CM | POA: Diagnosis not present

## 2015-09-06 DIAGNOSIS — I10 Essential (primary) hypertension: Secondary | ICD-10-CM

## 2015-09-06 LAB — POCT GLYCOSYLATED HEMOGLOBIN (HGB A1C): Hemoglobin A1C: 8.3

## 2015-09-06 MED ORDER — TRAZODONE HCL 50 MG PO TABS: 25.0000 mg | ORAL_TABLET | Freq: Every evening | ORAL | Status: DC | PRN

## 2015-09-06 MED ORDER — FUROSEMIDE 20 MG PO TABS: ORAL_TABLET | ORAL | Status: DC

## 2015-09-06 NOTE — Progress Notes (Signed)
    Subjective    Wyatt Sandoval is a 55 y.o. male that presents for a follow-up visit for:   1. Hypertension: Patient is adherent with enalapril and metoprolol. He is taking nitro about once every 4 days which has been helping with chest pain.  2. Diabetes: Patient is adherent with metformin 1000mg  BID. No polydipsia, polyuria or polyphagia.  3. Insomnia: Patients states he goes to bed around 11 and falls asleep around 11:30PM to 12AM. He then has uninterrupted sleep for about 5 hours. He reports having decreased energy later in the day around 2PM. He reports no new life stressors. He reports having a lot of thoughts about life stressors, like bills, etc.   Social History  Substance Use Topics  . Smoking status: Former Smoker -- 1.00 packs/day for 37 years    Types: Cigarettes    Start date: 02/03/1979    Quit date: 08/01/2015  . Smokeless tobacco: Never Used     Comment: Quit March 2017  . Alcohol Use: 0.0 oz/week    0 Standard drinks or equivalent per week     Comment: once or twice a year    Allergies  Allergen Reactions  . Bupropion Other (See Comments)    Memory and thinking impairment - dysphoria    No orders of the defined types were placed in this encounter.    ROS  Per HPI   Objective   BP 132/88 mmHg  Pulse 88  Temp(Src) 98.1 F (36.7 C) (Oral)  Ht 5\' 7"  (1.702 m)  Wt 210 lb (95.255 kg)  BMI 32.88 kg/m2  Vital signs reviewed  General: Well appearing, no distress  Assessment and Plan    Diabetes mellitus without complication Uncontrolled. Worsened A1C. Recommended starting Januvia, however, patient does not want to do this at the moment. Discussed risks of elevated blood sugar, including, but not limited to eye and kidney disease. Patient would like to continue diet modification and if still elevated at next check, will likely start new medication.  Hypertension Controlled today. No changes to medication regimen. Following up with cardiology  regarding anginal symptoms.  Insomnia Will start Trazodone 50mg  qhs to help sleep. Discussed lessening stressors which will likely help more than any medication regimen.

## 2015-09-06 NOTE — Patient Instructions (Signed)
Medication Instructions:  Your physician has recommended you make the following change in your medication:  1.  START Lasix 20 mg take 1 Sunday, Wednesday, & Friday this coming up week and then ONLY AS NEEDED   Labwork: None ordered  Testing/Procedures: Your physician has requested that you have a carotid duplex. This test is an ultrasound of the carotid arteries in your neck. It looks at blood flow through these arteries that supply the brain with blood. Allow one hour for this exam. There are no restrictions or special instructions.   Follow-Up: Your physician recommends that you schedule a follow-up appointment in: 3 MONTHS WITH DR. ROSS   Any Other Special Instructions Will Be Listed Below (If Applicable).    If you need a refill on your cardiac medications before your next appointment, please call your pharmacy.

## 2015-09-06 NOTE — Progress Notes (Signed)
Cardiology Office Note   Date:  09/06/2015   ID:  Wyatt Sandoval, DOB Feb 28, 1961, MRN [id number]  PCP:  Cordelia Poche, MD  Cardiologist:  Dr Recardo Evangelist, PA-C   No chief complaint on file.   History of Present Illness: Wyatt Sandoval is a 56 y.o. male with a history of HTN, DM, HLD, Hep C  Seen by Dr Harrington Challenger for DOE, s/p cath w/ 25% LAD, nl EF, elevated LVEDP, med rx  Wyatt Sandoval presents for post hospital follow-up  Since discharge from the hospital, he is done fairly well. He still has some dyspnea on exertion but it is better. He is compliant with his medications. He is trying to do well with the low sodium diabetic diet, but struggles. He is not really aware that his blood pressure is elevated, he does not have a cuff at home. However, he knows he needs to be better.  He is checking his blood sugars but doesn't do it consistently. That is mainly because he doesn't know what to do with the information. His last hemoglobin A1c was between 8 and 9.  He is aware that he has gained about 20 pounds. When he was 195 pounds, he states he felt much better. Today in the office, he is 212 and still does have the dyspnea on exertion. He does not have lower extremity edema, and may have some mild orthopnea, but no PND.   Past Medical History  Diagnosis Date  . Diabetes mellitus   . 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 Hyperlipidemia   . Hypertension   . Hepatitis C     Past Surgical History  Procedure Laterality Date  . Spinal fusion    . Cardiac catheterization N/A 08/26/2015    Procedure: Left Heart Cath and Coronary Angiography;  Surgeon: Jettie Booze, MD;  Location: [facility];  Service: Cardiovascular;  Laterality: N/A;    Current Outpatient Prescriptions  Medication Sig Dispense Refill  . amitriptyline (ELAVIL) 150 MG tablet TAKE 1 TABLET BY MOUTH AT  BEDTIME 90 tablet 0  . aspirin 81 MG tablet Take 1 tablet (81 mg total) by mouth daily. 30 tablet 11  . Blood Glucose Monitoring Suppl (ONE TOUCH ULTRA 2) W/DEVICE KIT by Does not apply route.     . enalapril (VASOTEC) 5 MG tablet Take 1 tablet (5 mg total) by mouth daily. 90 tablet 3  . fish oil-omega-3 fatty acids 1000 MG capsule Take 1 g by mouth 2 (two) times daily.     Marland Kitchen gabapentin (NEURONTIN) 300 MG capsule Take 300 mg by mouth daily.    Marland Kitchen glucose blood (ONE TOUCH ULTRA TEST) test strip Use as instructed 35 each 3  . LUMIGAN 0.01 % SOLN INT 1 DROP INTO BOTH EYES NIGHTLY  11  . metFORMIN (GLUCOPHAGE) 1000 MG tablet TAKE 1 TABLET BY MOUTH TWICE DAILY WITH A MEAL 180 tablet 0  . metoprolol tartrate (LOPRESSOR) 25 MG tablet TAKE 1/2 TABLET BY MOUTH TWICE DAILY 60 tablet 0  . nitroGLYCERIN (NITROSTAT) 0.4 MG SL tablet Place 1 tablet (0.4 mg total) under the tongue every 5 (five) minutes as needed for chest pain. 10 tablet 3  . omeprazole (PRILOSEC) 40 MG capsule Take 1 capsule (40 mg total) by mouth daily. 90 capsule 3  . oxyCODONE (OXYCONTIN) 15 MG TB12 Take 1 tablet (  15 mg total) by mouth every 12 (twelve) hours. (Patient taking differently: Take 15 mg by mouth every 4 (four) hours. ) 60 tablet   . simvastatin (ZOCOR) 40 MG tablet Take 1 tablet (40 mg total) by mouth every evening. 90 tablet 0  . traZODone (DESYREL) 50 MG tablet Take 0.5-1 tablets (25-50 mg total) by mouth at bedtime as needed for sleep. 30 tablet 3   No current facility-administered medications for this visit.    Allergies:   Bupropion    Social History:  The patient  reports that he quit smoking about 5 weeks ago. His smoking use included Cigarettes. He started smoking about 36 years ago. He has a 37 pack-year smoking history. He has never used smokeless tobacco. He reports that he drinks alcohol. He reports that he does not use illicit drugs.   Family History:  The patient's family history includes Heart attack in his  maternal grandfather and maternal grandmother; Hypertension in his father and mother; Liver cancer in his brother. There is no history of Colon cancer or Esophageal cancer.    ROS:  Please see the history of present illness. All other systems are reviewed and negative.    PHYSICAL EXAM: VS:  BP 148/89 mmHg  Pulse 80  Ht 5\' 7"  (1.702 m)  Wt 212 lb 6.4 oz (96.344 kg)  BMI 33.26 kg/m2 , BMI Body mass index is 33.26 kg/(m^2). GEN: Well nourished, well developed, male in no acute distress HEENT: normal for age  Neck: Minimal JVD, left carotid bruit, no masses Cardiac: RRR; soft murmur, no rubs, or gallops Respiratory:  Few rales bases bilaterally, normal work of breathing GI: soft, nontender, nondistended, + BS MS: no deformity or atrophy; no edema; distal pulses are 2+ in all 4 extremities  Skin: warm and dry, no rash Neuro:  Strength and sensation are intact Psych: euthymic mood, full affect   EKG:  EKG is ordered today. The ekg ordered today demonstrates sinus rhythm, right atrial enlargement and no acute ischemic changes   Recent Labs: 04/17/2015: ALT 28 08/22/2015: BUN 11; Creat 1.18; Hemoglobin 14.4; Platelets 268; Potassium 4.5; Sodium 138    Lipid Panel    Component Value Date/Time   CHOL 203* 04/17/2015 1225   TRIG 241* 04/17/2015 1225   HDL 51 04/17/2015 1225   CHOLHDL 4.0 04/17/2015 1225   VLDL 48* 04/17/2015 1225   LDLCALC 104 04/17/2015 1225     Wt Readings from Last 3 Encounters:  09/06/15 212 lb 6.4 oz (96.344 kg)  09/06/15 210 lb (95.255 kg)  08/26/15 210 lb (95.255 kg)     Other studies Reviewed: Additional studies/ records that were reviewed today include: Office notes, hospital records and testing.  ASSESSMENT AND PLAN:  1.  Dyspnea on exertion: He had an elevated LVEDP on his heart catheterization. A diuretic is a good blood pressure control as well. He is reluctant to take it because of frequent urination. The importance of a low sodium diabetic  diet was discussed.  We will add Lasix 20 mg, take 3 times in the next week and then as needed. If he does well with this, he may not have to take it every day.  2. 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 This will include blood pressure control, blood sugar control, and lipid-lowering.  3. Left carotid bruit: Exam was repeated several times. He has only a minimal heart murmur and I do not believe this is  radiation of murmur. We will obtain carotid Dopplers.   Current medicines are reviewed at length with the patient today.  The patient does not have concerns regarding medicines.  The following changes have been made:  Add Lasix  Labs/ tests ordered today include:   Orders Placed This Encounter  Procedures  . EKG 12-Lead     Disposition:   FU with Dr. Harrington Challenger  Signed, Karan Inclan, Suanne Marker, PA-C  09/06/2015 5:03 PM    [facility] Phone: [phone]; Fax: [phone]  This note was written with the assistance of speech recognition software. Please excuse any transcriptional errors.

## 2015-09-06 NOTE — Patient Instructions (Addendum)
Thank you for coming to see me today. It was a pleasure. Today we talked about:   Diabetes: Your A1C was 8.3 today which is not good. We discussed starting a new medication but you would rather work on diet modification and weight loss at the moment. This is okay, however I discussed that we really need to keep your A1C down to prevent kidney and eye disease.  Hypertension: no change today  Insomnia: I am starting Trazodone 50mg  at night.  Please make an appointment to see your new doctor in three months, or see me sooner if needed.  If you have any questions or concerns, please do not hesitate to call the office at [phone].  Sincerely,  Cordelia Poche, MD    Trazodone tablets What is this medicine? TRAZODONE (TRAZ oh done) is used to treat depression. This medicine may be used for other purposes; ask your health care provider or pharmacist if you have questions. What should I tell my health care provider before I take this medicine? They need to know if you have any of these conditions: -attempted suicide or thinking about it -bipolar disorder -bleeding problems -glaucoma -heart disease, or previous heart attack -irregular heart beat -kidney or liver disease -low levels of sodium in the blood -an unusual or allergic reaction to trazodone, other medicines, foods, dyes or preservatives -pregnant or trying to get pregnant -breast-feeding How should I use this medicine? Take this medicine by mouth with a glass of water. Follow the directions on the prescription label. Take this medicine shortly after a meal or a light snack. Take your medicine at regular intervals. Do not take your medicine more often than directed. Do not stop taking this medicine suddenly except upon the advice of your doctor. Stopping this medicine too quickly may cause serious side effects or your condition may worsen. A special MedGuide will be given to you by the pharmacist with each prescription and  refill. Be sure to read this information carefully each time. Talk to your pediatrician regarding the use of this medicine in children. Special care may be needed. Overdosage: If you think you have taken too much of this medicine contact a poison control center or emergency room at once. NOTE: This medicine is only for you. Do not share this medicine with others. What if I miss a dose? If you miss a dose, take it as soon as you can. If it is almost time for your next dose, take only that dose. Do not take double or extra doses. What may interact with this medicine? Do not take this medicine with any of the following medications: -certain medicines for fungal infections like fluconazole, itraconazole, ketoconazole, posaconazole, voriconazole -cisapride -dofetilide -dronedarone -linezolid -MAOIs like Carbex, Eldepryl, Marplan, Nardil, and Parnate -mesoridazine -methylene blue (injected into a vein) -pimozide -saquinavir -thioridazine -ziprasidone This medicine may also interact with the following medications: -alcohol -antiviral medicines for HIV or AIDS -aspirin and aspirin-like medicines -barbiturates like phenobarbital -certain medicines for blood pressure, heart disease, irregular heart beat -certain medicines for depression, anxiety, or psychotic disturbances -certain medicines for migraine headache like almotriptan, eletriptan, frovatriptan, naratriptan, rizatriptan, sumatriptan, zolmitriptan -certain medicines for seizures like carbamazepine and phenytoin -certain medicines for sleep -certain medicines that treat or prevent blood clots like dalteparin, enoxaparin, warfarin -digoxin -fentanyl -lithium -NSAIDS, medicines for pain and inflammation, like ibuprofen or naproxen -other medicines that prolong the QT interval (cause an abnormal heart rhythm) -rasagiline -supplements like St. John's wort, kava kava, valerian -tramadol -tryptophan This  list may not describe all  possible interactions. Give your health care provider a list of all the medicines, herbs, non-prescription drugs, or dietary supplements you use. Also tell them if you smoke, drink alcohol, or use illegal drugs. Some items may interact with your medicine. What should I watch for while using this medicine? Tell your doctor if your symptoms do not get better or if they get worse. Visit your doctor or health care professional for regular checks on your progress. Because it may take several weeks to see the full effects of this medicine, it is important to continue your treatment as prescribed by your doctor. Patients and their families should watch out for new or worsening thoughts of suicide or depression. Also watch out for sudden changes in feelings such as feeling anxious, agitated, panicky, irritable, hostile, aggressive, impulsive, severely restless, overly excited and hyperactive, or not being able to sleep. If this happens, especially at the beginning of treatment or after a change in dose, call your health care professional. Dennis Bast may get drowsy or dizzy. Do not drive, use machinery, or do anything that needs mental alertness until you know how this medicine affects you. Do not stand or sit up quickly, especially if you are an older patient. This reduces the risk of dizzy or fainting spells. Alcohol may interfere with the effect of this medicine. Avoid alcoholic drinks. This medicine may cause dry eyes and blurred vision. If you wear contact lenses you may feel some discomfort. Lubricating drops may help. See your eye doctor if the problem does not go away or is severe. Your mouth may get dry. Chewing sugarless gum, sucking hard candy and drinking plenty of water may help. Contact your doctor if the problem does not go away or is severe. What side effects may I notice from receiving this medicine? Side effects that you should report to your doctor or health care professional as soon as  possible: -allergic reactions like skin rash, itching or hives, swelling of the face, lips, or tongue -fast, irregular heartbeat -feeling faint or lightheaded, falls -painful erections or other sexual dysfunction -suicidal thoughts or other mood changes -trembling Side effects that usually do not require medical attention (report to your doctor or health care professional if they continue or are bothersome): -constipation -headache -muscle aches or pains -nausea, vomiting -unusually weak or tired This list may not describe all possible side effects. Call your doctor for medical advice about side effects. You may report side effects to FDA at 1-800-FDA-1088. Where should I keep my medicine? Keep out of the reach of children. Store at room temperature between 15 and 30 degrees C (59 to 86 degrees F). Protect from light. Keep container tightly closed. Throw away any unused medicine after the expiration date. NOTE: This sheet is a summary. It may not cover all possible information. If you have questions about this medicine, talk to your doctor, pharmacist, or health care provider.    2016, Elsevier/Gold Standard. (2012-11-21 15:46:28)

## 2015-09-09 NOTE — Assessment & Plan Note (Signed)
Uncontrolled. Worsened A1C. Recommended starting Januvia, however, patient does not want to do this at the moment. Discussed risks of elevated blood sugar, including, but not limited to eye and kidney disease. Patient would like to continue diet modification and if still elevated at next check, will likely start new medication.

## 2015-09-09 NOTE — Assessment & Plan Note (Signed)
Controlled today. No changes to medication regimen. Following up with cardiology regarding anginal symptoms.

## 2015-09-11 ENCOUNTER — Ambulatory Visit (HOSPITAL_COMMUNITY)
Admission: RE | Admit: 2015-09-11 | Discharge: 2015-09-11 | Disposition: A | Discharge status: Home / Self Care | Payer: Medicare Other | Source: Ambulatory Visit | Attending: Cardiovascular Disease | Admitting: Cardiovascular Disease

## 2015-09-11 DIAGNOSIS — I10 Essential (primary) hypertension: Secondary | ICD-10-CM | POA: Insufficient documentation

## 2015-09-11 DIAGNOSIS — E785 Hyperlipidemia, unspecified: Secondary | ICD-10-CM | POA: Diagnosis not present

## 2015-09-11 DIAGNOSIS — I6523 Occlusion and stenosis of bilateral carotid arteries: Secondary | ICD-10-CM | POA: Insufficient documentation

## 2015-09-11 DIAGNOSIS — R0989 Other specified symptoms and signs involving the circulatory and respiratory systems: Secondary | ICD-10-CM | POA: Diagnosis not present

## 2015-09-11 DIAGNOSIS — E119 Type 2 diabetes mellitus without complications: Secondary | ICD-10-CM | POA: Insufficient documentation

## 2015-09-16 ENCOUNTER — Other Ambulatory Visit: Payer: Self-pay | Admitting: Family Medicine

## 2015-09-16 ENCOUNTER — Telehealth: Payer: Self-pay | Admitting: *Deleted

## 2015-09-16 NOTE — Telephone Encounter (Signed)
Fay Records, MD   Sent: Tue Sep 10, 2015 5:10 PM    To: Rodman Key, RN        Message     I am due to see the pt in August.    He saw Suanne Marker last week     Please add on for June if possible (overbook)     Scheduled patient with Dr. Harrington Challenger on 6/23 at 9:45 am. Pt is seeing Dr. Gwenlyn Found on 5/30 as new PV consult for carotids.

## 2015-09-17 ENCOUNTER — Encounter: Payer: Medicare Other | Admitting: Cardiovascular Disease

## 2015-10-01 ENCOUNTER — Encounter: Payer: Self-pay | Admitting: Cardiovascular Disease

## 2015-10-01 ENCOUNTER — Ambulatory Visit (INDEPENDENT_AMBULATORY_CARE_PROVIDER_SITE_OTHER): Payer: Medicare Other | Admitting: Cardiovascular Disease

## 2015-10-01 ENCOUNTER — Ambulatory Visit (INDEPENDENT_AMBULATORY_CARE_PROVIDER_SITE_OTHER)
Admission: RE | Admit: 2015-10-01 | Discharge: 2015-10-01 | Disposition: A | Discharge status: Home / Self Care | Payer: Medicare Other | Source: Ambulatory Visit | Attending: Cardiovascular Disease | Admitting: Cardiovascular Disease

## 2015-10-01 DIAGNOSIS — I779 Disorder of arteries and arterioles, unspecified: Secondary | ICD-10-CM | POA: Diagnosis not present

## 2015-10-01 DIAGNOSIS — I739 Peripheral vascular disease, unspecified: Principal | ICD-10-CM

## 2015-10-01 HISTORY — DX: Disorder of arteries and arterioles, unspecified: I77.9

## 2015-10-01 MED ORDER — IOPAMIDOL (ISOVUE-370) INJECTION 76%
80.0000 mL | Freq: Once | INTRAVENOUS | Status: AC | PRN
  Administered 2015-10-01: 80 mL via INTRAVENOUS

## 2015-10-01 NOTE — Progress Notes (Signed)
10/01/2015 Wyatt Sandoval   1960-08-25  [id number]  Primary Physician Cordelia Poche, MD Primary Cardiologist: Lorretta Harp MD Renae Gloss   HPI:  Wyatt Sandoval is a very pleasant 69 year oldmild to moderately overweight single African-American male father of 4 children, grandfather of 3 grandchildren who is currently on disability because of back issues. He was referred for vascular consultation because of moderately severe left ICA stenosis found on duplex ultrasound 09/11/15. History of factors include 35-pack-years of tobacco abuse having recently stopped as well as a history of diabetes, hypertension and hyperlipidemia. He recently had a heart catheterization performed by Dr. Irish Lack 08/26/15 revealing essentially normal renal arteries and normal LV function.   Current Outpatient Prescriptions  Medication Sig Dispense Refill  . amitriptyline (ELAVIL) 150 MG tablet TAKE 1 TABLET BY MOUTH AT BEDTIME 90 tablet 0  . aspirin 81 MG tablet Take 1 tablet (81 mg total) by mouth daily. 30 tablet 11  . Blood Glucose Monitoring Suppl (ONE TOUCH ULTRA 2) W/DEVICE KIT by Does not apply route.     . enalapril (VASOTEC) 5 MG tablet Take 1 tablet (5 mg total) by mouth daily. 90 tablet 3  . fish oil-omega-3 fatty acids 1000 MG capsule Take 1 g by mouth 2 (two) times daily.     . furosemide (LASIX) 20 MG tablet TAKE 1 TABLET BY MOUTH ON Sunday, Wednesday, & Friday THIS COMING WEEK THEN START ONLY AS NEEDED FOR EDEMA 90 tablet 3  . gabapentin (NEURONTIN) 300 MG capsule Take 300 mg by mouth daily.    Marland Kitchen glucose blood (ONE TOUCH ULTRA TEST) test strip Use as instructed 35 each 3  . LUMIGAN 0.01 % SOLN INT 1 DROP INTO BOTH EYES NIGHTLY  11  . metFORMIN (GLUCOPHAGE) 1000 MG tablet TAKE 1 TABLET BY MOUTH TWICE DAILY WITH A MEAL 180 tablet 0  . metoprolol tartrate (LOPRESSOR) 25 MG tablet TAKE 1/2 TABLET BY MOUTH TWICE DAILY 90 tablet 0  . nitroGLYCERIN (NITROSTAT) 0.4 MG SL tablet Place 0.4 mg  under the tongue every 5 (five) minutes as needed for chest pain. TAKE BY MOUTH UP TO 3 DOSES FOR CHEST PAIN    . omeprazole (PRILOSEC) 40 MG capsule Take 1 capsule (40 mg total) by mouth daily. 90 capsule 3  . oxyCODONE (OXYCONTIN) 15 mg 12 hr tablet Take 15 mg by mouth every 4 (four) hours.    . simvastatin (ZOCOR) 40 MG tablet Take 1 tablet (40 mg total) by mouth every evening. 90 tablet 0  . traZODone (DESYREL) 50 MG tablet Take 0.5-1 tablets (25-50 mg total) by mouth at bedtime as needed for sleep. 30 tablet 3   No current facility-administered medications for this visit.    Allergies  Allergen Reactions  . Bupropion Other (See Comments)    Memory and thinking impairment - dysphoria    Social History   Social History  . Marital Status: Single    Spouse Name: N/A  . Number of Children: 4  . Years of Education: N/A   Occupational History  . Part-time    Social History Main Topics  . Smoking status: Former Smoker -- 1.00 packs/day for 37 years    Types: Cigarettes    Start date: 02/03/1979    Quit date: 08/01/2015  . Smokeless tobacco: Never Used     Comment: Quit March 2017  . Alcohol Use: 0.0 oz/week    0 Standard drinks or equivalent per week     Comment: once or twice  a year  . Drug Use: No  . Sexual Activity: Not on file   Other Topics Concern  . Not on file   Social History Narrative   Health Care POA:    Emergency Contact:    End of Life Plan:    Who lives with you: Will be living with daughter   Any pets: none   Diet: Patient has a varied diet and tried not to eat red meat.   Exercise: Patient does not have a regular exercise routine.   Seatbelts: Patient reports wearing seatbelt when in vehicle.   Sun Exposure/Protection:    Hobbies: cooking           Review of Systems: General: negative for chills, fever, night sweats or weight changes.  Cardiovascular: negative for chest pain, dyspnea on exertion, edema, orthopnea, palpitations, paroxysmal  nocturnal dyspnea or shortness of breath Dermatological: negative for rash Respiratory: negative for cough or wheezing Urologic: negative for hematuria Abdominal: negative for nausea, vomiting, diarrhea, bright red blood per rectum, melena, or hematemesis Neurologic: negative for visual changes, syncope, or dizziness All other systems reviewed and are otherwise negative except as noted above.    Blood pressure 140/80, pulse 102, height 5' 7" (1.702 m), weight 204 lb 9.6 oz (92.806 kg), SpO2 96 %.  General appearance: alert and no distress Neck: no adenopathy, no carotid bruit, no JVD, supple, symmetrical, trachea midline and thyroid not enlarged, symmetric, no tenderness/mass/nodules Lungs: clear to auscultation bilaterally Heart: regular rate and rhythm, S1, S2 normal, no murmur, click, rub or gallop Extremities: extremities normal, atraumatic, no cyanosis or edema  EKG not performed today  ASSESSMENT AND PLAN:   Left-sided carotid artery disease (HCC) Wyatt Sandoval has high-grade left ICA stenosis by duplex ultrasound performed in our office 09/11/15. He is neurologically symptomatically. He is an acceptable risk risk patient for elective left carotid endarterectomy. I am going to get a CT angiographic confirm the severity of the stenosis and refer him to Dr. Brabham source vascular surgical consultation.      Jonathan J. Berry MD FACP,FACC,FAHA, FSCAI 10/01/2015 10:06 AM  

## 2015-10-01 NOTE — Assessment & Plan Note (Signed)
Wyatt Sandoval has high-grade left ICA stenosis by duplex ultrasound performed in our office 09/11/15. He is neurologically symptomatically. He is an acceptable risk risk patient for elective left carotid endarterectomy. I am going to get a CT angiographic confirm the severity of the stenosis and refer him to Dr. Trula Slade source vascular surgical consultation.

## 2015-10-01 NOTE — Patient Instructions (Signed)
Medication Instructions:  Your physician recommends that you continue on your current medications as directed. Please refer to the Current Medication list given to you today.   Labwork: NONE  Testing/Procedures: Non-Cardiac CT Angiography OF THE NECK (CTA), is a special type of CT scan that uses a computer to produce multi-dimensional views of major blood vessels throughout the body. In CT angiography, a contrast material is injected through an IV to help visualize the blood vessels  Follow-Up: [facility] have [facility]. SOMEONE WILL CALL YOU TO SCHEDULE AN APPT. [phone].    Any Other Special Instructions Will Be Listed Below (If Applicable).     If you need a refill on your cardiac medications before your next appointment, please call your pharmacy.

## 2015-10-14 ENCOUNTER — Encounter: Payer: Self-pay | Admitting: Surgery

## 2015-10-20 ENCOUNTER — Other Ambulatory Visit: Payer: Self-pay | Admitting: Family Medicine

## 2015-10-21 ENCOUNTER — Ambulatory Visit (INDEPENDENT_AMBULATORY_CARE_PROVIDER_SITE_OTHER): Payer: Medicare Other | Admitting: Surgery

## 2015-10-21 ENCOUNTER — Encounter: Payer: Self-pay | Admitting: Surgery

## 2015-10-21 VITALS — BP 121/82 | HR 93 | Ht 67.0 in | Wt 204.2 lb

## 2015-10-21 DIAGNOSIS — I6523 Occlusion and stenosis of bilateral carotid arteries: Secondary | ICD-10-CM

## 2015-10-21 DIAGNOSIS — Z992 Dependence on renal dialysis: Secondary | ICD-10-CM

## 2015-10-21 DIAGNOSIS — N186 End stage renal disease: Secondary | ICD-10-CM | POA: Diagnosis not present

## 2015-10-21 NOTE — Progress Notes (Deleted)
   [facility]  Patient name: Wyatt Sandoval MRN: [id number] DOB: [birth year] Sex: male

## 2015-10-21 NOTE — Telephone Encounter (Signed)
Patient needs an appointment for Chantix as he has tried this medication 4 separate times and continues to relapse. We may need to switch method for smoking cessation and/or try to identify factors causing relapse.

## 2015-10-21 NOTE — Addendum Note (Signed)
Addended by: Serafina Mitchell on: 10/21/2015 12:03 PM   Modules accepted: Level of Service

## 2015-10-21 NOTE — Progress Notes (Addendum)
[facility] and [facility]in Specialist of [facility]  Patient name: Wyatt Sandoval MRN: [id number] DOB: April 03, 1961 Sex: male  REFERRING PHYSICIAN: Dr. Gwenlyn Found Carotid stenosis  REASON FOR CONSULT: Carotid stenosis  HPI: Wyatt Sandoval is a 55 y.o. male, who is is referred today for carotid stenosis.  The patient initially presented with chest pain and underwent a cardiac workup including catheterization which was negative.  He went on to have carotid Doppler studies which were greater than 80% left carotid stenosis and 40-59 percent right carotid stenosis.  He is asymptomatic.  Specifically, he denies numbness or weakness in either extremity.  He denies slurred speech.  He denies emergency jacks.  He does have some chronic numbness from his  back pain.  The patient suffers from diabetes.  He only takes oral medication.  He states his blood sugars are running a little high currently, around 140.  He is on a statin for hypercholesterolemia.  He takes multiple medications for hypertension.  He is a former smoker but recently quit.  The patient has undergone 2 neck surgeries via an anterior approach.  He does have some difficulty with swallowing and some hoarseness.  He has never had this evaluated  Past Medical History  Diagnosis Date  . Diabetes mellitus   . 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 Hyperlipidemia   . Hypertension   . Hepatitis C   . Left-sided carotid artery disease (HCC)     Family History  Problem Relation Age of Onset  . Colon cancer Neg Hx   . Esophageal cancer Neg Hx   . Liver cancer Brother     pancreatic  . Heart attack Maternal Grandmother   . Heart attack Maternal Grandfather   . Hypertension Mother   . Hypertension Father   . Heart disease Sister     SOCIAL HISTORY: Social History   Social History  . Marital Status: Single    Spouse  Name: N/A  . Number of Children: 4  . Years of Education: N/A   Occupational History  . Part-time    Social History Main Topics  . Smoking status: Former Smoker -- 1.00 packs/day for 37 years    Types: Cigarettes    Start date: 02/03/1979    Quit date: 08/01/2015  . Smokeless tobacco: Never Used     Comment: Quit March 2017  . Alcohol Use: 0.0 oz/week    0 Standard drinks or equivalent per week     Comment: once or twice a year  . Drug Use: No  . Sexual Activity: Not on file   Other Topics Concern  . Not on file   Social History Narrative   Health Care POA:    Emergency Contact:    End of Life Plan:    Who lives with you: Will be living with daughter   Any pets: none   Diet: Patient has a varied diet and tried not to eat red meat.   Exercise: Patient does not have a regular exercise routine.   Seatbelts: Patient reports wearing seatbelt when in vehicle.   Nancy Fetter Exposure/Protection:    Hobbies: cooking          Allergies  Allergen Reactions  . Bupropion Other (See Comments)    Memory and thinking impairment - dysphoria    Current Outpatient Prescriptions  Medication Sig Dispense Refill  . amitriptyline (ELAVIL) 150  MG tablet TAKE 1 TABLET BY MOUTH AT BEDTIME 90 tablet 0  . aspirin 81 MG tablet Take 1 tablet (81 mg total) by mouth daily. 30 tablet 11  . Blood Glucose Monitoring Suppl (ONE TOUCH ULTRA 2) W/DEVICE KIT by Does not apply route.     . enalapril (VASOTEC) 5 MG tablet Take 1 tablet (5 mg total) by mouth daily. 90 tablet 3  . fish oil-omega-3 fatty acids 1000 MG capsule Take 1 g by mouth 2 (two) times daily.     . furosemide (LASIX) 20 MG tablet TAKE 1 TABLET BY MOUTH ON Sunday, Wednesday, & Friday THIS COMING WEEK THEN START ONLY AS NEEDED FOR EDEMA 90 tablet 3  . gabapentin (NEURONTIN) 300 MG capsule Take 300 mg by mouth daily.    Marland Kitchen glucose blood (ONE TOUCH ULTRA TEST) test strip Use as instructed 35 each 3  . LUMIGAN 0.01 % SOLN INT 1 DROP INTO BOTH EYES  NIGHTLY  11  . metFORMIN (GLUCOPHAGE) 1000 MG tablet TAKE 1 TABLET BY MOUTH TWICE DAILY WITH A MEAL 180 tablet 0  . metoprolol tartrate (LOPRESSOR) 25 MG tablet TAKE 1/2 TABLET BY MOUTH TWICE DAILY 90 tablet 0  . nitroGLYCERIN (NITROSTAT) 0.4 MG SL tablet Place 0.4 mg under the tongue every 5 (five) minutes as needed for chest pain. TAKE BY MOUTH UP TO 3 DOSES FOR CHEST PAIN    . omeprazole (PRILOSEC) 40 MG capsule Take 1 capsule (40 mg total) by mouth daily. 90 capsule 3  . oxyCODONE (OXYCONTIN) 15 mg 12 hr tablet Take 15 mg by mouth every 4 (four) hours.    . simvastatin (ZOCOR) 40 MG tablet Take 1 tablet (40 mg total) by mouth every evening. 90 tablet 0  . traZODone (DESYREL) 50 MG tablet Take 0.5-1 tablets (25-50 mg total) by mouth at bedtime as needed for sleep. 30 tablet 3   No current facility-administered medications for this visit.    REVIEW OF SYSTEMS:  _0  denotes positive finding, _1  denotes negative finding Cardiac  Comments:  Chest pain or chest pressure: x   Shortness of breath upon exertion: x   Short of breath when lying flat:    Irregular heart rhythm:        Vascular    Pain in calf, thigh, or hip brought on by ambulation:    Pain in feet at night that wakes you up from your sleep:  x   Blood clot in your veins:    Leg swelling:         Pulmonary    Oxygen at home:    Productive cough:     Wheezing:         Neurologic    Sudden weakness in arms or legs:     Sudden numbness in arms or legs:     Sudden onset of difficulty speaking or slurred speech:    Temporary loss of vision in one eye:     Problems with dizziness:         Gastrointestinal    Blood in stool:     Vomited blood:         Genitourinary    Burning when urinating:     Blood in urine:        Psychiatric    Major depression:         Hematologic    Bleeding problems:    Problems with blood clotting too easily:        Skin  Rashes or ulcers:        Constitutional    Fever or  chills:      PHYSICAL EXAM: Filed Vitals:   10/21/15 1036 10/21/15 1037  BP: 123/81 121/82  Pulse: 93   Height: _0  (1.702 m)   Weight: 204 lb 3.2 oz (92.625 kg)   SpO2: 98%     GENERAL: The patient is a well-nourished male, in no acute distress. The vital signs are documented above. CARDIAC: There is a regular rate and rhythm.  VASCULAR: No carotid bruits PULMONARY: There is good air exchange bilaterally without wheezing or rales. ABDOMEN: Soft and non-tender with normal pitched bowel sounds.  MUSCULOSKELETAL: There are no major deformities or cyanosis. NEUROLOGIC: No focal weakness or paresthesias are detected. SKIN: There are no ulcers or rashes noted. PSYCHIATRIC: The patient has a normal affect.  DATA:  I have reviewed his ultrasound which shows greater than 80% left carotid stenosis and 40-59 percent right carotid stenosis.  I have also reviewed his CT scan with the following findings: 1. High-grade, near occlusive stenosis, of the left internal carotid artery at the bifurcation. 2. Moderate stenosis of the right internal carotid artery relative to the more distal vessel. 3. Atherosclerotic changes in the proximal vertebral arteries bilaterally without significant stenosis. 4. Cervical spine fusion at C3-4 and C6-7 with disc disease noted at C4-5 and C5-6.  MEDICAL ISSUES: Left carotid stenosis: I discussed proceeding with left carotid endarterectomy.  We discussed the risks and benefits of the surgery including the risk of stroke, nerve injury, and cardiopulmonary complications.  Despite the patient having had neck fusion 2, he has good range of motion and flexibility within his neck and I think he would tolerate positioning.  However, he does report dysphagia and hoarseness of a chronic duration.  For that Reason, I am going to have him evaluated by ENT.  If he has vocal cord abnormalities, I would consider stenting.  Currently, he is on the schedule for carotid  endarterectomy on Thursday, July 6  The patient is hep C positive   Annamarie Major, MD [facility] and Ve[facility] Specialists of [facility] [phone] Pager [phone]

## 2015-10-21 NOTE — Telephone Encounter (Signed)
Needs refill on metformin. [facility] [address] He also wants a refill on chantix

## 2015-10-22 ENCOUNTER — Telehealth: Payer: Self-pay | Admitting: Surgery

## 2015-10-22 NOTE — Telephone Encounter (Signed)
Spoke with patient - ENT eval prior to surgery is 10/24/15 9am with Dr. Simeon Craft at [facility]. Patient verbalized understanding.

## 2015-10-23 ENCOUNTER — Other Ambulatory Visit: Payer: Self-pay

## 2015-10-24 ENCOUNTER — Other Ambulatory Visit: Payer: Self-pay | Admitting: Family Medicine

## 2015-10-25 ENCOUNTER — Ambulatory Visit (INDEPENDENT_AMBULATORY_CARE_PROVIDER_SITE_OTHER): Payer: Medicare Other | Admitting: Internal Medicine

## 2015-10-25 ENCOUNTER — Encounter: Payer: Self-pay | Admitting: Internal Medicine

## 2015-10-25 VITALS — BP 140/86 | HR 94 | Ht 67.0 in | Wt 203.1 lb

## 2015-10-25 DIAGNOSIS — E785 Hyperlipidemia, unspecified: Secondary | ICD-10-CM | POA: Diagnosis not present

## 2015-10-25 DIAGNOSIS — E119 Type 2 diabetes mellitus without complications: Secondary | ICD-10-CM

## 2015-10-25 DIAGNOSIS — I6523 Occlusion and stenosis of bilateral carotid arteries: Secondary | ICD-10-CM

## 2015-10-25 LAB — LIPID PANEL
CHOL/HDL RATIO: 2.8 ratio (ref <=5.0)
Cholesterol: 148 mg/dL (ref 125–200)
HDL: 53 mg/dL (ref >=40)
LDL CALC: 72 mg/dL (ref <130)
Triglycerides: 113 mg/dL (ref <150)
VLDL: 23 mg/dL (ref <30)

## 2015-10-25 LAB — HEMOGLOBIN A1C
Hgb A1c MFr Bld: 8.2 % — ABNORMAL HIGH (ref <5.7)
Mean Plasma Glucose: 189 mg/dL

## 2015-10-25 NOTE — Progress Notes (Signed)
Cardiology Office Note   Date:  10/26/2015   ID:  Wyatt Sandoval, DOB December 14, 1960, MRN [id number]  PCP:  Cordelia Poche, MD  Cardiologist:   Dorris Carnes, MD   F/U of chest pressure / SOB     History of Present Illness: Wyatt Sandoval is a 55 y.o. male with a history of Chest pain  I saw him for the first time in April  Concerned re symtoms  L heart cath showed 25% LAD lesion  Elevated LVEDP at 18 mm  and Normal LVEF. Seen by R Barrett after d/c   Struggling with diet for DM   Feels better when wt 195    Carotid doppler with severe L ICA stenosis  Plan for L CEA in JUly    Since seen he is breathing a little better  Still gets SOB with activity  Had spirometry done Told not bad.  No CP   Quit tobacco  Spoke with sister on phone  She lives in [place]  Had [place] arrest   Was resuscitated   Now with Defib  Doing good  By her report no CAD or CHF   Given name for problem  Does not remember term  No other family memebers with simiarl events    Outpatient Prescriptions Prior to Visit  Medication Sig Dispense Refill  . aspirin 81 MG tablet Take 1 tablet (81 mg total) by mouth daily. 30 tablet 11  . Blood Glucose Monitoring Suppl (ONE TOUCH ULTRA 2) W/DEVICE KIT 1 each by Other route See admin instructions. Check blood sugar 2-3 times daily    . enalapril (VASOTEC) 5 MG tablet Take 1 tablet (5 mg total) by mouth daily. 90 tablet 3  . fish oil-omega-3 fatty acids 1000 MG capsule Take 1 g by mouth 2 (two) times daily.     Marland Kitchen gabapentin (NEURONTIN) 300 MG capsule Take 300 mg by mouth daily.    Marland Kitchen LUMIGAN 0.01 % SOLN Place 1 drop in to both eyes at bedtime  11  . nitroGLYCERIN (NITROSTAT) 0.4 MG SL tablet Place 0.4 mg under the tongue every 5 (five) minutes as needed for chest pain. TAKE BY MOUTH UP TO 3 DOSES FOR CHEST PAIN    . omeprazole (PRILOSEC) 40 MG capsule Take 1 capsule (40 mg total) by mouth daily. 90 capsule 3  . oxyCODONE (ROXICODONE) 15 MG immediate release tablet Take 15 mg by  mouth every 4 (four) hours as needed for pain.    . simvastatin (ZOCOR) 40 MG tablet Take 1 tablet (40 mg total) by mouth every evening. 90 tablet 0  . traZODone (DESYREL) 50 MG tablet Take 0.5-1 tablets (25-50 mg total) by mouth at bedtime as needed for sleep. 30 tablet 3  . amitriptyline (ELAVIL) 150 MG tablet TAKE 1 TABLET BY MOUTH AT BEDTIME (Patient taking differently: TAKE 150 MG BY MOUTH AT BEDTIME) 90 tablet 0  . furosemide (LASIX) 20 MG tablet TAKE 1 TABLET BY MOUTH ON Sunday, Wednesday, & Friday THIS COMING WEEK THEN START ONLY AS NEEDED FOR EDEMA (Patient taking differently: Take 20 mg by mouth See admin instructions. Take 20 mg by mouth on Sunday, Wednesday and Friday.) 90 tablet 3  . glucose blood (ONE TOUCH ULTRA TEST) test strip Use as instructed (Patient taking differently: 1 each by Other route See admin instructions. Check blood sugar 2-3 times daily) 35 each 3  . metFORMIN (GLUCOPHAGE) 1000 MG tablet TAKE 1 TABLET BY MOUTH TWICE DAILY WITH A MEAL (Patient taking differently:  TAKE 1000 MG BY MOUTH TWICE DAILY WITH A MEAL) 180 tablet 0  . metoprolol tartrate (LOPRESSOR) 25 MG tablet TAKE 1/2 TABLET BY MOUTH TWICE DAILY (Patient taking differently: TAKE 12.5 MG BY MOUTH TWICE DAILY) 90 tablet 0   No facility-administered medications prior to visit.     Allergies:   Bupropion   Past Medical History  Diagnosis Date  . Diabetes mellitus   . 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 Hyperlipidemia   . Hypertension   . Hepatitis C   . Left-sided carotid artery disease [facility])     Past Surgical History  Procedure Laterality Date  . Spinal fusion    . Cardiac catheterization N/A 08/26/2015    Procedure: Left Heart Cath and Coronary Angiography;  Surgeon: Jettie Booze, MD;  Location: [facility];  Service: Cardiovascular;  Laterality: N/A;     Social History:  The patient   reports that he quit smoking about 2 months ago. His smoking use included Cigarettes. He started smoking about 36 years ago. He has a 37 pack-year smoking history. He has never used smokeless tobacco. He reports that he drinks alcohol. He reports that he does not use illicit drugs.   Family History:  The patient's family history includes Heart attack in his maternal grandfather and maternal grandmother; Heart disease in his sister; Hypertension in his father and mother; Liver cancer in his brother. There is no history of Colon cancer or Esophageal cancer.    ROS:  Please see the history of present illness. All other systems are reviewed and  Negative to the above problem except as noted.    PHYSICAL EXAM: VS:  BP 140/86 mmHg  Pulse 94  Ht _0  (1.702 m)  Wt 203 lb 1.9 oz (92.135 kg)  BMI 31.81 kg/m2  GEN: Well nourished, well developed, in no acute distress HEENT: normal Neck: no JVD, carotid bruits, or masses Cardiac: RRR; no murmurs, rubs, or gallops,no edema  Respiratory:  clear to auscultation bilaterally, normal work of breathing GI: soft, nontender, nondistended, + BS  No hepatomegaly  MS: no deformity Moving all extremities   Skin: warm and dry, no rash Neuro:  Strength and sensation are intact Psych: euthymic mood, full affect   EKG:  EKG is not  ordered today.   Lipid Panel    Component Value Date/Time   CHOL 148 10/25/2015 1049   TRIG 113 10/25/2015 1049   HDL 53 10/25/2015 1049   CHOLHDL 2.8 10/25/2015 1049   VLDL 23 10/25/2015 1049   LDLCALC 72 10/25/2015 1049      Wt Readings from Last 3 Encounters:  10/25/15 203 lb 1.9 oz (92.135 kg)  10/21/15 204 lb 3.2 oz (92.625 kg)  10/01/15 204 lb 9.6 oz (92.806 kg)      ASSESSMENT AND PLAN:  1  Chest pressure  Denies  Volume status is OK on exam  Still with some SOB  With cath showing mild increased pressures could try additional lasix  Watch salt  Call with rsponse  2.  Tob  Has quit  3.  CV dz  PLan for CEA  in July  Low risk from cardiac standpoint    3 FHx Sudden death  Will review with EP   Asked sister to call back with Dx from MD  Pt's EKG from May QT is normal  4.  HL  Keep on simivistatin  WIll need t obe followed  Check lipids   5  DM  Check A1C    Signed, Dorris Carnes, MD  10/26/2015 10:07 AM    [facility] [place], [place], [place]  [address] Phone: [phone]; Fax: [phone]

## 2015-10-25 NOTE — Patient Instructions (Addendum)
Medication Instructions:  Your physician recommends that you continue on your current medications as directed. Please refer to the Current Medication list given to you today.  Labwork: Lp/a1c today   Testing/Procedures: none  Follow-Up: Your physician wants you to follow-up in: November  You will receive a reminder letter in the mail two months in advance. If you don't receive a letter, please call our office to schedule the follow-up appointment.  If you need a refill on your cardiac medications before your next appointment, please call your pharmacy.

## 2015-10-30 ENCOUNTER — Encounter (HOSPITAL_COMMUNITY)
Admission: RE | Admit: 2015-10-30 | Discharge: 2015-10-30 | Disposition: A | Discharge status: Home / Self Care | Payer: Medicare Other | Source: Ambulatory Visit | Attending: Surgery | Admitting: Surgery

## 2015-10-30 ENCOUNTER — Encounter (HOSPITAL_COMMUNITY): Payer: Self-pay

## 2015-10-30 DIAGNOSIS — E785 Hyperlipidemia, unspecified: Secondary | ICD-10-CM | POA: Diagnosis not present

## 2015-10-30 DIAGNOSIS — E119 Type 2 diabetes mellitus without complications: Secondary | ICD-10-CM | POA: Diagnosis not present

## 2015-10-30 DIAGNOSIS — Z87891 Personal history of nicotine dependence: Secondary | ICD-10-CM | POA: Diagnosis not present

## 2015-10-30 DIAGNOSIS — Z79899 Other long term (current) drug therapy: Secondary | ICD-10-CM | POA: Insufficient documentation

## 2015-10-30 DIAGNOSIS — I6522 Occlusion and stenosis of left carotid artery: Secondary | ICD-10-CM | POA: Diagnosis not present

## 2015-10-30 DIAGNOSIS — Z01812 Encounter for preprocedural laboratory examination: Secondary | ICD-10-CM | POA: Insufficient documentation

## 2015-10-30 DIAGNOSIS — Z7984 Long term (current) use of oral hypoglycemic drugs: Secondary | ICD-10-CM | POA: Insufficient documentation

## 2015-10-30 DIAGNOSIS — Z01818 Encounter for other preprocedural examination: Secondary | ICD-10-CM | POA: Diagnosis not present

## 2015-10-30 DIAGNOSIS — Z8619 Personal history of other infectious and parasitic diseases: Secondary | ICD-10-CM | POA: Diagnosis not present

## 2015-10-30 DIAGNOSIS — Z0183 Encounter for blood typing: Secondary | ICD-10-CM | POA: Insufficient documentation

## 2015-10-30 DIAGNOSIS — Z7982 Long term (current) use of aspirin: Secondary | ICD-10-CM | POA: Insufficient documentation

## 2015-10-30 DIAGNOSIS — I10 Essential (primary) hypertension: Secondary | ICD-10-CM | POA: Insufficient documentation

## 2015-10-30 HISTORY — DX: Presence of spectacles and contact lenses: Z97.3

## 2015-10-30 LAB — URINALYSIS, ROUTINE W REFLEX MICROSCOPIC
Bilirubin Urine: NEGATIVE
Glucose, UA: NEGATIVE mg/dL
Hgb urine dipstick: NEGATIVE
Ketones, ur: NEGATIVE mg/dL
LEUKOCYTES UA: NEGATIVE
Nitrite: NEGATIVE
PROTEIN: NEGATIVE mg/dL
Specific Gravity, Urine: 1.018 (ref 1.005–1.030)
pH: 6 (ref 5.0–8.0)

## 2015-10-30 LAB — COMPREHENSIVE METABOLIC PANEL
ALBUMIN: 4.2 g/dL (ref 3.5–5.0)
ALK PHOS: 53 U/L (ref 38–126)
ALT: 35 U/L (ref 17–63)
AST: 27 U/L (ref 15–41)
Anion gap: 7 (ref 5–15)
BUN: 11 mg/dL (ref 6–20)
CALCIUM: 9.8 mg/dL (ref 8.9–10.3)
CO2: 25 mmol/L (ref 22–32)
CREATININE: 1.08 mg/dL (ref 0.61–1.24)
Chloride: 104 mmol/L (ref 101–111)
GFR calc Af Amer: >60 mL/min (ref >60)
GFR calc non Af Amer: >60 mL/min (ref >60)
GLUCOSE: 128 mg/dL — AB (ref 65–99)
Potassium: 4.4 mmol/L (ref 3.5–5.1)
SODIUM: 136 mmol/L (ref 135–145)
Total Bilirubin: <0.1 mg/dL — ABNORMAL LOW (ref 0.3–1.2)
Total Protein: 6.7 g/dL (ref 6.5–8.1)

## 2015-10-30 LAB — CBC
HCT: 43.7 % (ref 39.0–52.0)
HEMOGLOBIN: 14.6 g/dL (ref 13.0–17.0)
MCH: 26.8 pg (ref 26.0–34.0)
MCHC: 33.4 g/dL (ref 30.0–36.0)
MCV: 80.2 fL (ref 78.0–100.0)
Platelets: 231 10*3/uL (ref 150–400)
RBC: 5.45 MIL/uL (ref 4.22–5.81)
RDW: 15.5 % (ref 11.5–15.5)
WBC: 6.1 10*3/uL (ref 4.0–10.5)

## 2015-10-30 LAB — PROTIME-INR
INR: 0.94 (ref 0.00–1.49)
Prothrombin Time: 12.8 seconds (ref 11.6–15.2)

## 2015-10-30 LAB — TYPE AND SCREEN
ABO/RH(D): B POS
Antibody Screen: NEGATIVE

## 2015-10-30 LAB — SURGICAL PCR SCREEN
MRSA, PCR: NEGATIVE
Staphylococcus aureus: NEGATIVE

## 2015-10-30 LAB — ABO/RH: ABO/RH(D): B POS

## 2015-10-30 LAB — GLUCOSE, CAPILLARY: Glucose-Capillary: 136 mg/dL — ABNORMAL HIGH (ref 65–99)

## 2015-10-30 LAB — APTT: APTT: 28 s (ref 24–37)

## 2015-10-30 NOTE — Pre-Procedure Instructions (Signed)
Jaeveon Sule  10/30/2015      [place], [place] [address] Phone: [phone] Fax: [place] [address] [address] [place], [place] [address] Phone: [phone] Fax: [phone]    Your procedure is scheduled on Thursday, November 07, 2015  Report to [facility] Admitting at 8:15 A.M.  Call this number if you have problems the morning of surgery:  [phone]   Remember:  Do not eat food or drink liquids after midnight Wednesday, November 06, 2015  Take these medicines the morning of surgery with A SIP OF WATER:   Aspirin, gabapentin (NEURONTIN), metoprolol tartrate (LOPRESSOR),  omeprazole (PRILOSEC), if needed: oxyCODONE (ROXICODONE)  for pain, nitroGLYCERIN (NITROSTAT) for chest pain  Stop taking vitamins, fish oil and herbal medications. Do not take any NSAIDs ie: Ibuprofen, Advil, Naproxen, BC and Goody Powder; stop Thursday, October 31, 2015.   How to Manage Your Diabetes Before and After Surgery  Why is it important to control my blood sugar before and after surgery? . Improving blood sugar levels before and after surgery helps healing and can limit problems. . A way of improving blood sugar control is eating a healthy diet by: o  Eating less sugar and carbohydrates o  Increasing activity/exercise o  Talking with your doctor about reaching your blood sugar goals . High blood sugars (greater than 180 mg/dL) can raise your risk of infections and slow your recovery, so you will need to focus on controlling your diabetes during the weeks before surgery. . Make sure that the doctor who takes care of your diabetes knows about your planned surgery including the date and location.  How do I manage my blood sugar before surgery? . Check your blood sugar at least 4 times a day, starting 2 days before surgery, to make  sure that the level is not too high or low. o Check your blood sugar the morning of your surgery when you wake up and every 2 hours until you get to the Short Stay unit. . If your blood sugar is less than 70 mg/dL, you will need to treat for low blood sugar: o Do not take insulin. o Treat a low blood sugar (less than 70 mg/dL) with  cup of clear juice (cranberry or apple), 4 glucose tablets, OR glucose gel. o Recheck blood sugar in 15 minutes after treatment (to make sure it is greater than 70 mg/dL). If your blood sugar is not greater than 70 mg/dL on recheck, call [phone] for further instructions. . Report your blood sugar to the short stay nurse when you get to Short Stay.  . If you are admitted to the hospital after surgery: o Your blood sugar will be checked by the staff and you will probably be given insulin after surgery (instead of oral diabetes medicines) to make sure you have good blood sugar levels. o The goal for blood sugar control after surgery is 80-180 mg/dL.  WHAT DO I DO ABOUT MY DIABETES MEDICATION?   Marland Kitchen Do not take oral diabetes medicines (pills) the morning of surgery such as metFORMIN (GLUCOPHAGE)   Patient Signature:  Date:   Nurse Signature:  Date:   Reviewed and Endorsed by [facility] Patient Education Committee, August 2015  Do not wear jewelry, make-up or nail polish.  Do not wear  lotions, powders, or perfumes.  You may wear deoderant.  Do not shave 48 hours prior to surgery.  Men may shave face and neck.  Do not bring valuables to the hospital.  [facility] is not responsible for any belongings or valuables.  Contacts, dentures or bridgework may not be worn into surgery.  Leave your suitcase in the car.  After surgery it may be brought to your room.  For patients admitted to the hospital, discharge time will be determined by your treatment team.  Patients discharged the day of surgery will not be allowed to drive home.   Name and phone number of  your driver:    Special instructions: Shower the night before surgery and the morning of surgery with CHG.  Please read over the following fact sheets that you were given. Pain Booklet, Coughing and Deep Breathing, Blood Transfusion Information, MRSA Information and Surgical Site Infection Prevention

## 2015-10-30 NOTE — Progress Notes (Signed)
   10/30/15 1012  OBSTRUCTIVE SLEEP APNEA  Have you ever been diagnosed with sleep apnea through a sleep study? No  Do you snore loudly (loud enough to be heard through closed doors)?  1  Do you often feel tired, fatigued, or sleepy during the daytime (such as falling asleep during driving or talking to someone)? 0  Has anyone observed you stop breathing during your sleep? 0  Do you have, or are you being treated for high blood pressure? 1  BMI more than 35 kg/m2? 0  Age > 50 (1-yes) 1  Neck circumference greater than:Male 16 inches or larger, Male 17inches or larger? 1  Male Gender (Yes=1) 1  Obstructive Sleep Apnea Score 5

## 2015-10-30 NOTE — Progress Notes (Signed)
Pt denies SOB and chest pain. Pt stated that his fasting blood glucose ranges between 120-140. Pt chart forwarded to anesthesia to review cardiac note from Dr. Harrington Challenger dated 10/25/15.

## 2015-10-31 NOTE — Progress Notes (Signed)
Anesthesia Chart Review:  Pt is a 55 year old male scheduled for L CEA on 11/07/2015 with Harold Barban, MD.   Cardiologist is Dorris Carnes, MD who has cleared pt for surgery.   PMH includes:  HTN, DM, hyperlipidemia, hepatitis C, carotid stenosis. Family hx VF arrest (sister). Former smoker. BMI 32  Medications include: ASA, enalapril, lasix, metformin, metoprolol, prilosec, simvastatin  Preoperative labs reviewed.  Glucose 128. HgbA1c was 8.2 on 10/25/15  Chest x-ray 08/06/15 reviewed. No active cardiopulmonary disease  EKG 09/06/15: NSR. RAE.  Carotid duplex 09/11/15:  - 40-59% RICA stenosis. - 123456 LICA stenosis  Cardiac cath 08/26/15:   Mid LAD lesion, 25% stenosed. Nonobstructive CAD.  The left ventricular systolic function is normal.  Short aortic arch making catheter torquing, particularly for the left coronary artery, difficult.  Moderately elevated LVEDP.  If no changes, I anticipate pt can proceed with surgery as scheduled.   Willeen Cass, FNP-BC [facility]/Anesthesiology Phone: [phone] 10/31/2015 2:35 PM

## 2015-11-06 MED ORDER — DEXTROSE 5 % IV SOLN
1.5000 g | INTRAVENOUS | Status: AC
  Administered 2015-11-07: 1.5 g via INTRAVENOUS
  Filled 2015-11-06: qty 1.5

## 2015-11-07 ENCOUNTER — Inpatient Hospital Stay (HOSPITAL_COMMUNITY): Payer: Medicare Other | Admitting: Anesthesiology

## 2015-11-07 ENCOUNTER — Inpatient Hospital Stay (HOSPITAL_COMMUNITY): Payer: Medicare Other | Admitting: Emergency Medicine

## 2015-11-07 ENCOUNTER — Inpatient Hospital Stay (HOSPITAL_COMMUNITY)
Admission: RE | Admit: 2015-11-07 | Discharge: 2015-11-08 | DRG: 027 | Disposition: A | Discharge status: Home / Self Care | Payer: Medicare Other | Source: Ambulatory Visit | Attending: Surgery | Admitting: Surgery

## 2015-11-07 ENCOUNTER — Encounter (HOSPITAL_COMMUNITY)
Admission: RE | Disposition: A | Discharge status: Home / Self Care | Payer: Self-pay | Source: Ambulatory Visit | Attending: Surgery

## 2015-11-07 ENCOUNTER — Encounter (HOSPITAL_COMMUNITY): Payer: Self-pay | Admitting: *Deleted

## 2015-11-07 DIAGNOSIS — M542 Cervicalgia: Secondary | ICD-10-CM | POA: Diagnosis present

## 2015-11-07 DIAGNOSIS — I6522 Occlusion and stenosis of left carotid artery: Principal | ICD-10-CM

## 2015-11-07 DIAGNOSIS — Z8249 Family history of ischemic heart disease and other diseases of the circulatory system: Secondary | ICD-10-CM

## 2015-11-07 DIAGNOSIS — I10 Essential (primary) hypertension: Secondary | ICD-10-CM | POA: Diagnosis present

## 2015-11-07 DIAGNOSIS — Z87891 Personal history of nicotine dependence: Secondary | ICD-10-CM

## 2015-11-07 DIAGNOSIS — Z7984 Long term (current) use of oral hypoglycemic drugs: Secondary | ICD-10-CM | POA: Diagnosis not present

## 2015-11-07 DIAGNOSIS — Y999 Unspecified external cause status: Secondary | ICD-10-CM | POA: Diagnosis not present

## 2015-11-07 DIAGNOSIS — Z79891 Long term (current) use of opiate analgesic: Secondary | ICD-10-CM | POA: Diagnosis not present

## 2015-11-07 DIAGNOSIS — B1920 Unspecified viral hepatitis C without hepatic coma: Secondary | ICD-10-CM | POA: Diagnosis present

## 2015-11-07 DIAGNOSIS — E785 Hyperlipidemia, unspecified: Secondary | ICD-10-CM | POA: Diagnosis present

## 2015-11-07 DIAGNOSIS — E7800 Pure hypercholesterolemia, unspecified: Secondary | ICD-10-CM | POA: Diagnosis present

## 2015-11-07 DIAGNOSIS — I6529 Occlusion and stenosis of unspecified carotid artery: Secondary | ICD-10-CM | POA: Diagnosis present

## 2015-11-07 DIAGNOSIS — G8929 Other chronic pain: Secondary | ICD-10-CM | POA: Diagnosis present

## 2015-11-07 DIAGNOSIS — Z7982 Long term (current) use of aspirin: Secondary | ICD-10-CM

## 2015-11-07 DIAGNOSIS — Z79899 Other long term (current) drug therapy: Secondary | ICD-10-CM

## 2015-11-07 DIAGNOSIS — M549 Dorsalgia, unspecified: Secondary | ICD-10-CM | POA: Diagnosis present

## 2015-11-07 DIAGNOSIS — Y9389 Activity, other specified: Secondary | ICD-10-CM | POA: Diagnosis not present

## 2015-11-07 DIAGNOSIS — E119 Type 2 diabetes mellitus without complications: Secondary | ICD-10-CM | POA: Diagnosis present

## 2015-11-07 DIAGNOSIS — Y92410 Unspecified street and highway as the place of occurrence of the external cause: Secondary | ICD-10-CM | POA: Diagnosis not present

## 2015-11-07 HISTORY — DX: Other chronic pain: G89.29

## 2015-11-07 HISTORY — DX: Type 2 diabetes mellitus without complications: E11.9

## 2015-11-07 HISTORY — DX: Headache, unspecified: R51.9

## 2015-11-07 HISTORY — DX: Headache: R51

## 2015-11-07 HISTORY — PX: CAROTID ENDARTERECTOMY: SUR193

## 2015-11-07 HISTORY — PX: ENDARTERECTOMY: SHX5162

## 2015-11-07 HISTORY — DX: Low back pain: M54.5

## 2015-11-07 HISTORY — DX: Low back pain, unspecified: M54.50

## 2015-11-07 LAB — CBC
HCT: 38.1 % — ABNORMAL LOW (ref 39.0–52.0)
HEMOGLOBIN: 12.1 g/dL — AB (ref 13.0–17.0)
MCH: 25.9 pg — AB (ref 26.0–34.0)
MCHC: 31.8 g/dL (ref 30.0–36.0)
MCV: 81.6 fL (ref 78.0–100.0)
Platelets: 171 10*3/uL (ref 150–400)
RBC: 4.67 MIL/uL (ref 4.22–5.81)
RDW: 15.4 % (ref 11.5–15.5)
WBC: 4 10*3/uL (ref 4.0–10.5)

## 2015-11-07 LAB — CREATININE, SERUM
Creatinine, Ser: 1.08 mg/dL (ref 0.61–1.24)
GFR calc non Af Amer: >60 mL/min (ref >60)

## 2015-11-07 LAB — GLUCOSE, CAPILLARY
GLUCOSE-CAPILLARY: 146 mg/dL — AB (ref 65–99)
GLUCOSE-CAPILLARY: 150 mg/dL — AB (ref 65–99)
GLUCOSE-CAPILLARY: 231 mg/dL — AB (ref 65–99)
GLUCOSE-CAPILLARY: 71 mg/dL (ref 65–99)

## 2015-11-07 SURGERY — ENDARTERECTOMY, CAROTID
Anesthesia: General | Site: Neck | Laterality: Left

## 2015-11-07 MED ORDER — OXYCODONE HCL 5 MG PO TABS
15.0000 mg | ORAL_TABLET | ORAL | Status: DC | PRN
  Administered 2015-11-07 – 2015-11-08 (×2): 15 mg via ORAL
  Filled 2015-11-07 (×2): qty 3

## 2015-11-07 MED ORDER — GUAIFENESIN-DM 100-10 MG/5ML PO SYRP: 15.0000 mL | ORAL_SOLUTION | ORAL | Status: DC | PRN

## 2015-11-07 MED ORDER — SUGAMMADEX SODIUM 200 MG/2ML IV SOLN
INTRAVENOUS | Status: DC | PRN
  Administered 2015-11-07: 183.4 mg via INTRAVENOUS

## 2015-11-07 MED ORDER — SODIUM CHLORIDE 0.9 % IV SOLN: INTRAVENOUS | Status: DC

## 2015-11-07 MED ORDER — FENTANYL CITRATE (PF) 100 MCG/2ML IJ SOLN
INTRAMUSCULAR | Status: DC | PRN
  Administered 2015-11-07 (×2): 50 ug via INTRAVENOUS

## 2015-11-07 MED ORDER — OXYCODONE HCL 5 MG PO TABS
5.0000 mg | ORAL_TABLET | Freq: Once | ORAL | Status: AC | PRN
  Administered 2015-11-07: 5 mg via ORAL

## 2015-11-07 MED ORDER — SODIUM CHLORIDE 0.9 % IV SOLN
INTRAVENOUS | Status: DC | PRN
  Administered 2015-11-07: 500 mL

## 2015-11-07 MED ORDER — PHENOL 1.4 % MT LIQD
1.0000 | OROMUCOSAL | Status: DC | PRN
Start: 2015-11-07 — End: 2015-11-08

## 2015-11-07 MED ORDER — DEXTROSE 5 % IV SOLN
1.5000 g | Freq: Two times a day (BID) | INTRAVENOUS | Status: DC
  Administered 2015-11-07: 1.5 g via INTRAVENOUS
  Filled 2015-11-07 (×2): qty 1.5

## 2015-11-07 MED ORDER — PHENYLEPHRINE HCL 10 MG/ML IJ SOLN
10.0000 mg | INTRAVENOUS | Status: DC | PRN
  Administered 2015-11-07: 25 ug/min via INTRAVENOUS

## 2015-11-07 MED ORDER — OXYCODONE HCL 5 MG/5ML PO SOLN: 5.0000 mg | Freq: Once | ORAL | Status: AC | PRN

## 2015-11-07 MED ORDER — SODIUM CHLORIDE 0.9 % IV SOLN
0.0125 ug/kg/min | INTRAVENOUS | Status: AC
  Administered 2015-11-07: .2 ug/kg/min via INTRAVENOUS
  Filled 2015-11-07: qty 2000

## 2015-11-07 MED ORDER — GABAPENTIN 300 MG PO CAPS
300.0000 mg | ORAL_CAPSULE | Freq: Every day | ORAL | Status: DC
Start: 2015-11-07 — End: 2015-11-08
  Administered 2015-11-07: 300 mg via ORAL
  Filled 2015-11-07: qty 1

## 2015-11-07 MED ORDER — METOPROLOL TARTRATE 5 MG/5ML IV SOLN
2.0000 mg | INTRAVENOUS | Status: DC | PRN
Start: 2015-11-07 — End: 2015-11-08

## 2015-11-07 MED ORDER — LACTATED RINGERS IV SOLN
INTRAVENOUS | Status: DC | PRN
  Administered 2015-11-07: 10:00:00 via INTRAVENOUS

## 2015-11-07 MED ORDER — SENNOSIDES-DOCUSATE SODIUM 8.6-50 MG PO TABS: 1.0000 | ORAL_TABLET | Freq: Every evening | ORAL | Status: DC | PRN

## 2015-11-07 MED ORDER — FENTANYL CITRATE (PF) 250 MCG/5ML IJ SOLN
INTRAMUSCULAR | Status: AC
  Filled 2015-11-07: qty 5

## 2015-11-07 MED ORDER — LACTATED RINGERS IV SOLN
INTRAVENOUS | Status: DC
  Administered 2015-11-07: 09:00:00 via INTRAVENOUS

## 2015-11-07 MED ORDER — ALBUMIN HUMAN 5 % IV SOLN
INTRAVENOUS | Status: DC | PRN
  Administered 2015-11-07: 11:00:00 via INTRAVENOUS

## 2015-11-07 MED ORDER — HYDROMORPHONE HCL 1 MG/ML IJ SOLN
0.5000 mg | INTRAMUSCULAR | Status: DC | PRN
  Administered 2015-11-07 (×2): 1 mg via INTRAVENOUS
  Filled 2015-11-07 (×2): qty 1

## 2015-11-07 MED ORDER — FENTANYL CITRATE (PF) 100 MCG/2ML IJ SOLN
25.0000 ug | INTRAMUSCULAR | Status: DC | PRN
  Administered 2015-11-07: 50 ug via INTRAVENOUS

## 2015-11-07 MED ORDER — OXYCODONE HCL 5 MG PO TABS
ORAL_TABLET | ORAL | Status: AC
  Filled 2015-11-07: qty 1

## 2015-11-07 MED ORDER — ACETAMINOPHEN 325 MG RE SUPP
325.0000 mg | RECTAL | Status: DC | PRN
Start: 2015-11-07 — End: 2015-11-08

## 2015-11-07 MED ORDER — 0.9 % SODIUM CHLORIDE (POUR BTL) OPTIME
TOPICAL | Status: DC | PRN
  Administered 2015-11-07 (×3): 1000 mL

## 2015-11-07 MED ORDER — HYDRALAZINE HCL 20 MG/ML IJ SOLN: 5.0000 mg | INTRAMUSCULAR | Status: DC | PRN

## 2015-11-07 MED ORDER — CHLORHEXIDINE GLUCONATE CLOTH 2 % EX PADS: 6.0000 | MEDICATED_PAD | Freq: Once | CUTANEOUS | Status: DC

## 2015-11-07 MED ORDER — ONDANSETRON HCL 4 MG/2ML IJ SOLN: 4.0000 mg | Freq: Four times a day (QID) | INTRAMUSCULAR | Status: DC | PRN

## 2015-11-07 MED ORDER — PROPOFOL 10 MG/ML IV BOLUS
INTRAVENOUS | Status: DC | PRN
  Administered 2015-11-07: 70 mg via INTRAVENOUS

## 2015-11-07 MED ORDER — INSULIN ASPART 100 UNIT/ML ~~LOC~~ SOLN
0.0000 [IU] | Freq: Three times a day (TID) | SUBCUTANEOUS | Status: DC
  Administered 2015-11-07: 3 [IU] via SUBCUTANEOUS
  Administered 2015-11-08: 1 [IU] via SUBCUTANEOUS

## 2015-11-07 MED ORDER — PANTOPRAZOLE SODIUM 40 MG PO TBEC: 40.0000 mg | DELAYED_RELEASE_TABLET | Freq: Every day | ORAL | Status: DC

## 2015-11-07 MED ORDER — AMITRIPTYLINE HCL 25 MG PO TABS
150.0000 mg | ORAL_TABLET | Freq: Every day | ORAL | Status: DC
  Administered 2015-11-07: 150 mg via ORAL
  Filled 2015-11-07: qty 6

## 2015-11-07 MED ORDER — HEMOSTATIC AGENTS (NO CHARGE) OPTIME
TOPICAL | Status: DC | PRN
  Administered 2015-11-07: 1 via TOPICAL

## 2015-11-07 MED ORDER — SODIUM CHLORIDE 0.9 % IV SOLN
INTRAVENOUS | Status: DC
  Administered 2015-11-07: 17:00:00 via INTRAVENOUS

## 2015-11-07 MED ORDER — HEPARIN SODIUM (PORCINE) 1000 UNIT/ML IJ SOLN
INTRAMUSCULAR | Status: DC | PRN
  Administered 2015-11-07: 9000 [IU] via INTRAVENOUS

## 2015-11-07 MED ORDER — NITROGLYCERIN 0.4 MG SL SUBL: 0.4000 mg | SUBLINGUAL_TABLET | SUBLINGUAL | Status: DC | PRN

## 2015-11-07 MED ORDER — ENOXAPARIN SODIUM 40 MG/0.4ML ~~LOC~~ SOLN
40.0000 mg | SUBCUTANEOUS | Status: DC
Start: 2015-11-08 — End: 2015-11-08

## 2015-11-07 MED ORDER — LIDOCAINE HCL (CARDIAC) 20 MG/ML IV SOLN
INTRAVENOUS | Status: DC | PRN
  Administered 2015-11-07: 50 mg via INTRAVENOUS

## 2015-11-07 MED ORDER — POTASSIUM CHLORIDE CRYS ER 20 MEQ PO TBCR: 20.0000 meq | EXTENDED_RELEASE_TABLET | Freq: Every day | ORAL | Status: DC | PRN

## 2015-11-07 MED ORDER — MAGNESIUM SULFATE 2 GM/50ML IV SOLN: 2.0000 g | Freq: Every day | INTRAVENOUS | Status: DC | PRN

## 2015-11-07 MED ORDER — METFORMIN HCL 500 MG PO TABS
1000.0000 mg | ORAL_TABLET | Freq: Two times a day (BID) | ORAL | Status: DC
  Administered 2015-11-07 – 2015-11-08 (×2): 1000 mg via ORAL
  Filled 2015-11-07 (×2): qty 2

## 2015-11-07 MED ORDER — ACETAMINOPHEN 325 MG PO TABS: 325.0000 mg | ORAL_TABLET | ORAL | Status: DC | PRN

## 2015-11-07 MED ORDER — PHENYLEPHRINE HCL 10 MG/ML IJ SOLN
INTRAMUSCULAR | Status: DC | PRN
  Administered 2015-11-07 (×3): 120 ug via INTRAVENOUS
  Administered 2015-11-07 (×2): 80 ug via INTRAVENOUS

## 2015-11-07 MED ORDER — ROCURONIUM BROMIDE 100 MG/10ML IV SOLN
INTRAVENOUS | Status: DC | PRN
  Administered 2015-11-07: 10 mg via INTRAVENOUS
  Administered 2015-11-07: 50 mg via INTRAVENOUS
  Administered 2015-11-07 (×2): 10 mg via INTRAVENOUS

## 2015-11-07 MED ORDER — LIDOCAINE HCL (PF) 1 % IJ SOLN
INTRAMUSCULAR | Status: AC
  Filled 2015-11-07: qty 30

## 2015-11-07 MED ORDER — PROTAMINE SULFATE 10 MG/ML IV SOLN
INTRAVENOUS | Status: DC | PRN
  Administered 2015-11-07 (×5): 10 mg via INTRAVENOUS

## 2015-11-07 MED ORDER — METOPROLOL TARTRATE 12.5 MG HALF TABLET
12.5000 mg | ORAL_TABLET | Freq: Two times a day (BID) | ORAL | Status: DC
Start: 2015-11-07 — End: 2015-11-08
  Administered 2015-11-07: 12.5 mg via ORAL
  Filled 2015-11-07: qty 1

## 2015-11-07 MED ORDER — ONDANSETRON HCL 4 MG/2ML IJ SOLN
INTRAMUSCULAR | Status: DC | PRN
  Administered 2015-11-07: 4 mg via INTRAVENOUS

## 2015-11-07 MED ORDER — SIMVASTATIN 40 MG PO TABS
40.0000 mg | ORAL_TABLET | Freq: Every day | ORAL | Status: DC
  Administered 2015-11-07: 40 mg via ORAL
  Filled 2015-11-07: qty 1

## 2015-11-07 MED ORDER — SODIUM CHLORIDE 0.9 % IV SOLN: 500.0000 mL | Freq: Once | INTRAVENOUS | Status: DC | PRN

## 2015-11-07 MED ORDER — LABETALOL HCL 5 MG/ML IV SOLN
10.0000 mg | INTRAVENOUS | Status: DC | PRN
  Administered 2015-11-08 (×2): 10 mg via INTRAVENOUS
  Filled 2015-11-07 (×2): qty 4

## 2015-11-07 MED ORDER — TRAZODONE HCL 50 MG PO TABS: 25.0000 mg | ORAL_TABLET | Freq: Every evening | ORAL | Status: DC | PRN

## 2015-11-07 MED ORDER — ENALAPRIL MALEATE 5 MG PO TABS
5.0000 mg | ORAL_TABLET | Freq: Every day | ORAL | Status: DC
  Filled 2015-11-07: qty 1

## 2015-11-07 MED ORDER — PROPOFOL 10 MG/ML IV BOLUS
INTRAVENOUS | Status: AC
  Filled 2015-11-07: qty 40

## 2015-11-07 MED ORDER — ACETAMINOPHEN 160 MG/5ML PO SOLN
325.0000 mg | ORAL | Status: DC | PRN
  Filled 2015-11-07: qty 20.3

## 2015-11-07 MED ORDER — ASPIRIN 81 MG PO CHEW: 81.0000 mg | CHEWABLE_TABLET | Freq: Every day | ORAL | Status: DC

## 2015-11-07 MED ORDER — ACETAMINOPHEN 325 MG PO TABS
325.0000 mg | ORAL_TABLET | ORAL | Status: DC | PRN
Start: 2015-11-07 — End: 2015-11-08

## 2015-11-07 MED ORDER — ALUM & MAG HYDROXIDE-SIMETH 200-200-20 MG/5ML PO SUSP: 15.0000 mL | ORAL | Status: DC | PRN

## 2015-11-07 MED ORDER — LATANOPROST 0.005 % OP SOLN
1.0000 [drp] | Freq: Every day | OPHTHALMIC | Status: DC
  Administered 2015-11-07: 1 [drp] via OPHTHALMIC
  Filled 2015-11-07: qty 2.5

## 2015-11-07 MED ORDER — FENTANYL CITRATE (PF) 100 MCG/2ML IJ SOLN
INTRAMUSCULAR | Status: AC
  Filled 2015-11-07: qty 2

## 2015-11-07 MED ORDER — DOCUSATE SODIUM 100 MG PO CAPS: 100.0000 mg | ORAL_CAPSULE | Freq: Every day | ORAL | Status: DC

## 2015-11-07 SURGICAL SUPPLY — 47 items
CANISTER SUCTION 2500CC (MISCELLANEOUS) ×3 IMPLANT
CATH ROBINSON RED A/P 18FR (CATHETERS) ×3 IMPLANT
CATH SUCT 10FR WHISTLE TIP (CATHETERS) ×3 IMPLANT
CLIP TI MEDIUM 6 (CLIP) ×3 IMPLANT
CLIP TI WIDE RED SMALL 6 (CLIP) ×3 IMPLANT
CRADLE DONUT ADULT HEAD (MISCELLANEOUS) ×3 IMPLANT
DRAIN CHANNEL 15F RND FF W/TCR (WOUND CARE) IMPLANT
ELECT REM PT RETURN 9FT ADLT (ELECTROSURGICAL) ×3
ELECTRODE REM PT RTRN 9FT ADLT (ELECTROSURGICAL) ×1 IMPLANT
EVACUATOR SILICONE 100CC (DRAIN) IMPLANT
GAUZE SPONGE 4X4 12PLY STRL (GAUZE/BANDAGES/DRESSINGS) ×3 IMPLANT
GLOVE BIOGEL PI IND STRL 7.0 (GLOVE) ×1 IMPLANT
GLOVE BIOGEL PI IND STRL 7.5 (GLOVE) ×3 IMPLANT
GLOVE BIOGEL PI INDICATOR 7.0 (GLOVE) ×2
GLOVE BIOGEL PI INDICATOR 7.5 (GLOVE) ×6
GLOVE ECLIPSE 7.0 STRL STRAW (GLOVE) ×3 IMPLANT
GLOVE SURG SS PI 6.5 STRL IVOR (GLOVE) ×3 IMPLANT
GLOVE SURG SS PI 7.5 STRL IVOR (GLOVE) ×6 IMPLANT
GOWN STRL REUS W/ TWL LRG LVL3 (GOWN DISPOSABLE) ×3 IMPLANT
GOWN STRL REUS W/ TWL XL LVL3 (GOWN DISPOSABLE) ×1 IMPLANT
GOWN STRL REUS W/TWL LRG LVL3 (GOWN DISPOSABLE) ×6
GOWN STRL REUS W/TWL XL LVL3 (GOWN DISPOSABLE) ×2
HEMOSTAT SNOW SURGICEL 2X4 (HEMOSTASIS) ×3 IMPLANT
INSERT FOGARTY SM (MISCELLANEOUS) IMPLANT
KIT BASIN OR (CUSTOM PROCEDURE TRAY) ×3 IMPLANT
KIT ROOM TURNOVER OR (KITS) ×3 IMPLANT
LIQUID BAND (GAUZE/BANDAGES/DRESSINGS) ×3 IMPLANT
NEEDLE HYPO 25GX1X1/2 BEV (NEEDLE) IMPLANT
NS IRRIG 1000ML POUR BTL (IV SOLUTION) ×9 IMPLANT
PACK CAROTID (CUSTOM PROCEDURE TRAY) ×3 IMPLANT
PAD ARMBOARD 7.5X6 YLW CONV (MISCELLANEOUS) ×6 IMPLANT
PATCH VASC XENOSURE 1CMX6CM (Vascular Products) ×2 IMPLANT
PATCH VASC XENOSURE 1X6 (Vascular Products) ×1 IMPLANT
SHUNT CAROTID BYPASS 10 (VASCULAR PRODUCTS) IMPLANT
SHUNT CAROTID BYPASS 12FRX15.5 (VASCULAR PRODUCTS) IMPLANT
SPONGE INTESTINAL PEANUT (DISPOSABLE) IMPLANT
SUT ETHILON 3 0 PS 1 (SUTURE) IMPLANT
SUT PROLENE 6 0 BV (SUTURE) ×9 IMPLANT
SUT PROLENE 7 0 BV 1 (SUTURE) IMPLANT
SUT PROLENE 7 0 BV1 MDA (SUTURE) ×3 IMPLANT
SUT SILK 3 0 TIES 17X18 (SUTURE)
SUT SILK 3-0 18XBRD TIE BLK (SUTURE) IMPLANT
SUT VIC AB 3-0 SH 27 (SUTURE) ×4
SUT VIC AB 3-0 SH 27X BRD (SUTURE) ×2 IMPLANT
SUT VICRYL 4-0 PS2 18IN ABS (SUTURE) ×3 IMPLANT
SYR CONTROL 10ML LL (SYRINGE) IMPLANT
WATER STERILE IRR 1000ML POUR (IV SOLUTION) ×3 IMPLANT

## 2015-11-07 NOTE — Anesthesia Preprocedure Evaluation (Signed)
Anesthesia Evaluation  Patient identified by MRN, date of birth, ID band Patient awake    Reviewed: Allergy & Precautions, NPO status , Patient's Chart, lab work & pertinent test results  History of Anesthesia Complications Negative for: history of anesthetic complications  Airway Mallampati: II  TM Distance: >3 FB Neck ROM: Full    Dental  (+) Edentulous Upper, Edentulous Lower   Pulmonary shortness of breath, former smoker,    breath sounds clear to auscultation       Cardiovascular hypertension, Pt. on medications and Pt. on home beta blockers + Peripheral Vascular Disease and + DOE   Rhythm:Regular     Neuro/Psych  Headaches, PSYCHIATRIC DISORDERS  Neuromuscular disease    GI/Hepatic negative GI ROS, (+) Hepatitis -, C  Endo/Other  diabetes, Type 2, Oral Hypoglycemic AgentsMorbid obesity  Renal/GU negative Renal ROS     Musculoskeletal   Abdominal   Peds  Hematology   Anesthesia Other Findings   Reproductive/Obstetrics                             Anesthesia Physical Anesthesia Plan  ASA: III  Anesthesia Plan: General   Post-op Pain Management:    Induction: Intravenous  Airway Management Planned: Oral ETT  Additional Equipment: Arterial line  Intra-op Plan:   Post-operative Plan: Extubation in OR  Informed Consent: I have reviewed the patients History and Physical, chart, labs and discussed the procedure including the risks, benefits and alternatives for the proposed anesthesia with the patient or authorized representative who has indicated his/her understanding and acceptance.   Dental advisory given  Plan Discussed with: CRNA and Surgeon  Anesthesia Plan Comments:         Anesthesia Quick Evaluation

## 2015-11-07 NOTE — Op Note (Signed)
Patient name: Wyatt Sandoval MRN: [id number] DOB: 04/07/1961 Sex: male  11/07/2015 Pre-operative Diagnosis: Asymptomatic   left carotid stenosis Post-operative diagnosis:  Same Surgeon:  Annamarie Major Assistants:  Lennie Muckle Procedure:    left carotid Endarterectomy with bovine pericardial patch angioplasty Anesthesia:  General Blood Loss:  See anesthesia record Specimens:  Carotid Plaque to pathology  Findings:  80 %stenosis; Thrombus:  none  Indications:  55 yo with asymptomatic left ICA stenosis  Procedure:  The patient was identified in the holding area and taken to [facility]  The patient was then placed supine on the table.   General endotrachial anesthesia was administered.  The patient was prepped and draped in the usual sterile fashion.  A time out was called and antibiotics were administered.  The incision was made along the anterior border of the left sternocleidomastoid muscle.  Cautery was used to dissect through the subcutaneous tissue.  The platysma muscle was divided with cautery.  The internal jugular vein was exposed along its anterior medial border.  The common facial vein was exposed and then divided between 2-0 silk ties and metal clips.  The common carotid artery was then circumferentially exposed and encircled with an umbilical tape.  The vagus nerve was identified and protected.  Next sharp dissection was used to expose the external carotid artery and the superior thyroid artery.  The were encircled with a blue vessel loop and a 2-0 silk tie respectively.  Finally, the internal carotid was carefully dissected free.  An umbilical tape was placed around the internal carotid artery distal to the diseased segment.  The hypoglossal nerve was visualized throughout and protected.  The patient was given systemic heparinization.  A bovine carotid patch was selected and prepared on the back table.  A 10 french shunt was also prepared.  After blood pressure readings were  appropriate and the heparin had been given time to circulate, the internal carotid artery was occluded with a baby Gregory clamp.  The external and common carotid arteries were then occluded with vascular clamps and the 2-0 tie tightened on the superior thyroid artery.  A #11 blade was used to make an arteriotomy in the common carotid artery.  This was extended with Potts scissors along the anterior and lateral border of the common and internal carotid artery.  Approximately 80% stenosis was identified.  There was no thrombus identified.  The 10 french shunt was not placed as there was excellent backbleeding.  A kleiner kuntz elevator was used to perform endarterectomy.  An eversion endarterectomy was performed in the external carotid artery.  A good distal endpoint was obtained in the internal carotid artery.  The specimen was removed and sent to pathology.  Heparinized saline was used to irrigate the endarterectomized field.  All potential embolic debris was removed.  Bovine pericardial patch angioplasty was then performed using a running 6-0 Prolene. The common internal and external carotid arteries were all appropriately flushed. The artery was again irrigated with heparin saline.  The anastomosis was then secured. The clamp was first released on the external carotid artery followed by the common carotid artery approximately 30 seconds later, bloodflow was reestablish through the internal carotid artery.  Next, a hand-held  Doppler was used to evaluate the signals in the common, external, and internal  carotid arteries, all of which had appropriate signals. I then administered  50 mg protamine. The wound was then irrigated.  After hemostasis was achieved, the carotid sheath was reapproximated with  3-0 Vicryl. The  platysma muscle was reapproximated with running 3-0 Vicryl. The skin  was closed with 4-0 Vicryl. Dermabond was placed on the skin. The  patient was then successfully extubated. His neurologic  exam was  similar to his preprocedural exam. The patient was then taken to recovery room  in stable condition. There were no complications.     Disposition:  To PACU in stable condition.  Relevant Operative Details:  Normal anatomy.  No shunt utilized as the exposure was difficult and he had excellent back-bleeding from the internal carotid.  Theotis Burrow, M.D. [facility] [place] Office: [phone] Pager:  [phone]

## 2015-11-07 NOTE — Anesthesia Procedure Notes (Signed)
Procedure Name: Intubation Performed by: Eligha Bridegroom Pre-anesthesia Checklist: Patient identified, Emergency Drugs available, Suction available, Patient being monitored and Timeout performed Patient Re-evaluated:Patient Re-evaluated prior to inductionOxygen Delivery Method: Circle system utilized Preoxygenation: Pre-oxygenation with 100% oxygen Intubation Type: IV induction Ventilation: Mask ventilation without difficulty Laryngoscope Size: Miller and 3 Grade View: Grade I Tube type: Oral Tube size: 7.5 mm Number of attempts: 1 Placement Confirmation: ETT inserted through vocal cords under direct vision,  positive ETCO2 and breath sounds checked- equal and bilateral Secured at: 23 cm Tube secured with: Tape Dental Injury: Teeth and Oropharynx as per pre-operative assessment

## 2015-11-07 NOTE — Transfer of Care (Signed)
Immediate Anesthesia Transfer of Care Note  Patient: Trebor Walcutt  Procedure(s) Performed: Procedure(s): LEFT CAROTID ENDARTERECTOMY  (Left)  Patient Location: PACU  Anesthesia Type:General  Level of Consciousness: awake, alert  and oriented  Airway & Oxygen Therapy: Patient Spontanous Breathing and Patient connected to nasal cannula oxygen  Post-op Assessment: Report given to RN and Post -op Vital signs reviewed and stable  Post vital signs: Reviewed and stable  Last Vitals:  Filed Vitals:   11/07/15 0840  BP: 126/76  Pulse: 85  Temp: 36.9 C  Resp: 20    Last Pain:  Filed Vitals:   11/07/15 0847  PainSc: 6       Patients Stated Pain Goal: 0 ([id number])  Complications: No apparent anesthesia complications

## 2015-11-07 NOTE — Progress Notes (Signed)
       Alert and oriented No incisional hematoma UE grip 5/5 equal bil.   Slight asymmetric smile on the left   S/P Left CEA Disposition stable  Nikalas Bramel MAUREEN PA-C

## 2015-11-07 NOTE — Progress Notes (Signed)
Pt's oxygen saturation dropped to upper 70-low 80%s.  RN attempted to put oxygen on patient.  Patient refused stating "everytime they put that thing in my nose I end up with a cold".  Patient also states that he has had sleep apnea testing and has been told that he does not need oxygen at night.  Sats rose to mid 90s during conversation.  Will continue to monitor.

## 2015-11-07 NOTE — H&P (View-Only) (Signed)
 [facility]  Patient name: Wyatt Sandoval MRN: [id number] DOB: 02/17/1961 Sex: male  REFERRING PHYSICIAN: Dr. Berry Carotid stenosis  REASON FOR CONSULT: Carotid stenosis  HPI: Wyatt Sandoval is a 55 y.o. male, who is is referred today for carotid stenosis.  The patient initially presented with chest pain and underwent a cardiac workup including catheterization which was negative.  He went on to have carotid Doppler studies which were greater than 80% left carotid stenosis and 40-59 percent right carotid stenosis.  He is asymptomatic.  Specifically, he denies numbness or weakness in either extremity.  He denies slurred speech.  He denies emergency jacks.  He does have some chronic numbness from his  back pain.  The patient suffers from diabetes.  He only takes oral medication.  He states his blood sugars are running a little high currently, around 140.  He is on a statin for hypercholesterolemia.  He takes multiple medications for hypertension.  He is a former smoker but recently quit.  The patient has undergone 2 neck surgeries via an anterior approach.  He does have some difficulty with swallowing and some hoarseness.  He has never had this evaluated  Past Medical History  Diagnosis Date  . Diabetes mellitus   . 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 Hyperlipidemia   . Hypertension   . Hepatitis C   . Left-sided carotid artery disease (HCC)     Family History  Problem Relation Age of Onset  . Colon cancer Neg Hx   . Esophageal cancer Neg Hx   . Liver cancer Brother     pancreatic  . Heart attack Maternal Grandmother   . Heart attack Maternal Grandfather   . Hypertension Mother   . Hypertension Father   . Heart disease Sister     SOCIAL HISTORY: Social History   Social History  . Marital Status: Single    Spouse  Name: N/A  . Number of Children: 4  . Years of Education: N/A   Occupational History  . Part-time    Social History Main Topics  . Smoking status: Former Smoker -- 1.00 packs/day for 37 years    Types: Cigarettes    Start date: 02/03/1979    Quit date: 08/01/2015  . Smokeless tobacco: Never Used     Comment: Quit March 2017  . Alcohol Use: 0.0 oz/week    0 Standard drinks or equivalent per week     Comment: once or twice a year  . Drug Use: No  . Sexual Activity: Not on file   Other Topics Concern  . Not on file   Social History Narrative   Health Care POA:    Emergency Contact:    End of Life Plan:    Who lives with you: Will be living with daughter   Any pets: none   Diet: Patient has a varied diet and tried not to eat red meat.   Exercise: Patient does not have a regular exercise routine.   Seatbelts: Patient reports wearing seatbelt when in vehicle.   Sun Exposure/Protection:    Hobbies: cooking          Allergies  Allergen Reactions  . Bupropion Other (See Comments)    Memory and thinking impairment - dysphoria    Current Outpatient Prescriptions  Medication Sig Dispense Refill  . amitriptyline (ELAVIL) 150   MG tablet TAKE 1 TABLET BY MOUTH AT BEDTIME 90 tablet 0  . aspirin 81 MG tablet Take 1 tablet (81 mg total) by mouth daily. 30 tablet 11  . Blood Glucose Monitoring Suppl (ONE TOUCH ULTRA 2) W/DEVICE KIT by Does not apply route.     . enalapril (VASOTEC) 5 MG tablet Take 1 tablet (5 mg total) by mouth daily. 90 tablet 3  . fish oil-omega-3 fatty acids 1000 MG capsule Take 1 g by mouth 2 (two) times daily.     . furosemide (LASIX) 20 MG tablet TAKE 1 TABLET BY MOUTH ON Sunday, Wednesday, & Friday THIS COMING WEEK THEN START ONLY AS NEEDED FOR EDEMA 90 tablet 3  . gabapentin (NEURONTIN) 300 MG capsule Take 300 mg by mouth daily.    . glucose blood (ONE TOUCH ULTRA TEST) test strip Use as instructed 35 each 3  . LUMIGAN 0.01 % SOLN INT 1 DROP INTO BOTH EYES  NIGHTLY  11  . metFORMIN (GLUCOPHAGE) 1000 MG tablet TAKE 1 TABLET BY MOUTH TWICE DAILY WITH A MEAL 180 tablet 0  . metoprolol tartrate (LOPRESSOR) 25 MG tablet TAKE 1/2 TABLET BY MOUTH TWICE DAILY 90 tablet 0  . nitroGLYCERIN (NITROSTAT) 0.4 MG SL tablet Place 0.4 mg under the tongue every 5 (five) minutes as needed for chest pain. TAKE BY MOUTH UP TO 3 DOSES FOR CHEST PAIN    . omeprazole (PRILOSEC) 40 MG capsule Take 1 capsule (40 mg total) by mouth daily. 90 capsule 3  . oxyCODONE (OXYCONTIN) 15 mg 12 hr tablet Take 15 mg by mouth every 4 (four) hours.    . simvastatin (ZOCOR) 40 MG tablet Take 1 tablet (40 mg total) by mouth every evening. 90 tablet 0  . traZODone (DESYREL) 50 MG tablet Take 0.5-1 tablets (25-50 mg total) by mouth at bedtime as needed for sleep. 30 tablet 3   No current facility-administered medications for this visit.    REVIEW OF SYSTEMS:  [X] denotes positive finding, [ ] denotes negative finding Cardiac  Comments:  Chest pain or chest pressure: x   Shortness of breath upon exertion: x   Short of breath when lying flat:    Irregular heart rhythm:        Vascular    Pain in calf, thigh, or hip brought on by ambulation:    Pain in feet at night that wakes you up from your sleep:  x   Blood clot in your veins:    Leg swelling:         Pulmonary    Oxygen at home:    Productive cough:     Wheezing:         Neurologic    Sudden weakness in arms or legs:     Sudden numbness in arms or legs:     Sudden onset of difficulty speaking or slurred speech:    Temporary loss of vision in one eye:     Problems with dizziness:         Gastrointestinal    Blood in stool:     Vomited blood:         Genitourinary    Burning when urinating:     Blood in urine:        Psychiatric    Major depression:         Hematologic    Bleeding problems:    Problems with blood clotting too easily:        Skin      Rashes or ulcers:        Constitutional    Fever or  chills:      PHYSICAL EXAM: Filed Vitals:   10/21/15 1036 10/21/15 1037  BP: 123/81 121/82  Pulse: 93   Height: 5' 7" (1.702 m)   Weight: 204 lb 3.2 oz (92.625 kg)   SpO2: 98%     GENERAL: The patient is a well-nourished male, in no acute distress. The vital signs are documented above. CARDIAC: There is a regular rate and rhythm.  VASCULAR: No carotid bruits PULMONARY: There is good air exchange bilaterally without wheezing or rales. ABDOMEN: Soft and non-tender with normal pitched bowel sounds.  MUSCULOSKELETAL: There are no major deformities or cyanosis. NEUROLOGIC: No focal weakness or paresthesias are detected. SKIN: There are no ulcers or rashes noted. PSYCHIATRIC: The patient has a normal affect.  DATA:  I have reviewed his ultrasound which shows greater than 80% left carotid stenosis and 40-59 percent right carotid stenosis.  I have also reviewed his CT scan with the following findings: 1. High-grade, near occlusive stenosis, of the left internal carotid artery at the bifurcation. 2. Moderate stenosis of the right internal carotid artery relative to the more distal vessel. 3. Atherosclerotic changes in the proximal vertebral arteries bilaterally without significant stenosis. 4. Cervical spine fusion at C3-4 and C6-7 with disc disease noted at C4-5 and C5-6.  MEDICAL ISSUES: Left carotid stenosis: I discussed proceeding with left carotid endarterectomy.  We discussed the risks and benefits of the surgery including the risk of stroke, nerve injury, and cardiopulmonary complications.  Despite the patient having had neck fusion 2, he has good range of motion and flexibility within his neck and I think he would tolerate positioning.  However, he does report dysphagia and hoarseness of a chronic duration.  For that Reason, I am going to have him evaluated by ENT.  If he has vocal cord abnormalities, I would consider stenting.  Currently, he is on the schedule for carotid  endarterectomy on Thursday, July 6  The patient is hep C positive   Wells Brabham, MD [facility] Tel [phone] Pager [phone]  

## 2015-11-07 NOTE — Interval H&P Note (Signed)
History and Physical Interval Note:  11/07/2015 9:18 AM  Wyatt Sandoval  has presented today for surgery, with the diagnosis of Left carotid artery stenosis I65.22  The various methods of treatment have been discussed with the patient and family. After consideration of risks, benefits and other options for treatment, the patient has consented to  Procedure(s): ENDARTERECTOMY CAROTID (Left) as a surgical intervention .  The patient's history has been reviewed, patient examined, no change in status, stable for surgery.  I have reviewed the patient's chart and labs.  Questions were answered to the patient's satisfaction.     Brabham, Wells  No new neuro sx's Neuro intact CV:RRR Pulm: CTA Plan:  L CEA All questions asnwered   Annamarie Major

## 2015-11-08 ENCOUNTER — Emergency Department (HOSPITAL_COMMUNITY)
Admission: EM | Admit: 2015-11-08 | Discharge: 2015-11-08 | Disposition: A | Discharge status: Home / Self Care | Payer: Medicare Other | Attending: Emergency Medicine | Admitting: Emergency Medicine

## 2015-11-08 ENCOUNTER — Encounter (HOSPITAL_COMMUNITY): Payer: Self-pay | Admitting: Surgery

## 2015-11-08 ENCOUNTER — Telehealth: Payer: Self-pay | Admitting: Surgery

## 2015-11-08 DIAGNOSIS — E119 Type 2 diabetes mellitus without complications: Secondary | ICD-10-CM | POA: Insufficient documentation

## 2015-11-08 DIAGNOSIS — M542 Cervicalgia: Secondary | ICD-10-CM | POA: Diagnosis not present

## 2015-11-08 DIAGNOSIS — Z7982 Long term (current) use of aspirin: Secondary | ICD-10-CM | POA: Insufficient documentation

## 2015-11-08 DIAGNOSIS — I10 Essential (primary) hypertension: Secondary | ICD-10-CM | POA: Insufficient documentation

## 2015-11-08 DIAGNOSIS — Z7984 Long term (current) use of oral hypoglycemic drugs: Secondary | ICD-10-CM | POA: Insufficient documentation

## 2015-11-08 DIAGNOSIS — Z87891 Personal history of nicotine dependence: Secondary | ICD-10-CM | POA: Insufficient documentation

## 2015-11-08 DIAGNOSIS — Y999 Unspecified external cause status: Secondary | ICD-10-CM | POA: Insufficient documentation

## 2015-11-08 DIAGNOSIS — Y92410 Unspecified street and highway as the place of occurrence of the external cause: Secondary | ICD-10-CM | POA: Insufficient documentation

## 2015-11-08 DIAGNOSIS — Z79899 Other long term (current) drug therapy: Secondary | ICD-10-CM | POA: Insufficient documentation

## 2015-11-08 DIAGNOSIS — Y9389 Activity, other specified: Secondary | ICD-10-CM | POA: Insufficient documentation

## 2015-11-08 LAB — BASIC METABOLIC PANEL
ANION GAP: 6 (ref 5–15)
BUN: 9 mg/dL (ref 6–20)
CALCIUM: 8.5 mg/dL — AB (ref 8.9–10.3)
CO2: 26 mmol/L (ref 22–32)
CREATININE: 1.11 mg/dL (ref 0.61–1.24)
Chloride: 103 mmol/L (ref 101–111)
GFR calc Af Amer: >60 mL/min (ref >60)
GLUCOSE: 136 mg/dL — AB (ref 65–99)
Potassium: 4.5 mmol/L (ref 3.5–5.1)
Sodium: 135 mmol/L (ref 135–145)

## 2015-11-08 LAB — GLUCOSE, CAPILLARY: Glucose-Capillary: 144 mg/dL — ABNORMAL HIGH (ref 65–99)

## 2015-11-08 LAB — CBC
HCT: 41 % (ref 39.0–52.0)
Hemoglobin: 13.1 g/dL (ref 13.0–17.0)
MCH: 26.4 pg (ref 26.0–34.0)
MCHC: 32 g/dL (ref 30.0–36.0)
MCV: 82.5 fL (ref 78.0–100.0)
PLATELETS: 195 10*3/uL (ref 150–400)
RBC: 4.97 MIL/uL (ref 4.22–5.81)
RDW: 15.8 % — AB (ref 11.5–15.5)
WBC: 5.8 10*3/uL (ref 4.0–10.5)

## 2015-11-08 LAB — HEMOGLOBIN A1C
Hgb A1c MFr Bld: 7.8 % — ABNORMAL HIGH (ref 4.8–5.6)
Mean Plasma Glucose: 177 mg/dL

## 2015-11-08 MED ORDER — CYCLOBENZAPRINE HCL 10 MG PO TABS: 10.0000 mg | ORAL_TABLET | Freq: Two times a day (BID) | ORAL | Status: DC | PRN

## 2015-11-08 MED ORDER — LIDOCAINE 5 % EX PTCH: 1.0000 | MEDICATED_PATCH | CUTANEOUS | Status: DC

## 2015-11-08 MED ORDER — NAPROXEN 500 MG PO TABS: 500.0000 mg | ORAL_TABLET | Freq: Two times a day (BID) | ORAL | Status: DC

## 2015-11-08 NOTE — ED Notes (Signed)
Patient ambulaed 200 feet. Tolerated well. Gait steady
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
## 2015-11-08 NOTE — Telephone Encounter (Signed)
-----   Message from Mena Goes, RN sent at 11/08/2015  9:24 AM EDT ----- Regarding: 2 weeks postop   ----- Message -----    From: Dario Ave    Sent: 11/08/2015   7:55 AM      To: [facility] Charge Pool Subject: Kay's log                                        ----- Message -----    From: Ulyses Amor, PA-C    Sent: 11/08/2015   7:46 AM      To: [facility] Charge Pool  F/U in the office in 2 weeks left CEA
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
## 2015-11-08 NOTE — Discharge Instructions (Signed)
Carotid Angioplasty With Stent, Care After These instructions give you information about caring for yourself after your procedure. Your doctor may also give you more specific instructions. Call your doctor if you have any problems or questions after your procedure. HOME CARE  Take medicines only as told by your doctor.  You may shower. Take off the bandage and gently wash the area where the tube was inserted with soap and water. Pat the area dry with a clean towel. Do not rub the area.  Do not take baths, swim, or use a hot tub.  Check the area where the tube was inserted every day. Check for redness, swelling, and fluid coming from the area.  Do not put powder or lotion on the area.  Do not lift heavy objects. This is anything weighing over 10 lb (4.5 kg).  Ask your doctor when it is okay to:  Go back to work or school.  Do physical activities or play sports.  Have sex.  Eat a diet that is healthy for your heart. This includes:  Fresh fruits and vegetables.  Lean cuts of meat.  Avoid:  Foods high in salt.  Canned or very processed foods.  Food that is high in saturated fat or sugar.  Fried food.  Make other healthy changes as told by your doctor, which may include these:  Do not smoke, chew tobacco, or use electronic cigarettes.  Keep a healthy weight.  Exercise often.  Manage your blood pressure.  Limit how much alcohol you drink.  Manage other health problems, such as diabetes and sleep apnea.  Keep all follow-up visits as told by your doctor. This is important. GET HELP IF:  You have a fever.  You have chills.  You have more bleeding from the area where the tube was inserted. Hold pressure on the area. GET HELP RIGHT AWAY IF:  You have vision changes or loss of vision.  You have numbness or weakness on one side of your body.  You have trouble talking, or you have slurred speech or cannot speak.  You feel confused or have trouble  remembering.  You have a lot of pain at the area where the tube was inserted.  You have redness, warmth, or swelling at the area where the tube was inserted.  You have fluid coming from the area where the tube was inserted. This is more than a small amount of blood on the bandage.  The area where the tube was inserted is bleeding, and the bleeding does not stop after 30 minutes of holding steady pressure on the area. These symptoms may be an emergency. Do not wait to see if the symptoms will go away. Get help right away. Call your local emergency services (911 in [place] Do not drive yourself to the hospital.   This information is not intended to replace advice given to you by your health care provider. Make sure you discuss any questions you have with your health care provider.   Document Released: 04/25/2013 Document Revised: 05/11/2014 Document Reviewed: 04/25/2013 Elsevier Interactive Patient Education 2016 [employer]  Carotid Angioplasty With Stent, Care After These instructions give you information about caring for yourself after your procedure. Your doctor may also give you more specific instructions. Call your doctor if you have any problems or questions after your procedure. HOME CARE  Take medicines only as told by your doctor.  You may shower. Take off the bandage and gently wash the area where the tube was inserted with  soap and water. Pat the area dry with a clean towel. Do not rub the area.  Do not take baths, swim, or use a hot tub.  Check the area where the tube was inserted every day. Check for redness, swelling, and fluid coming from the area.  Do not put powder or lotion on the area.  Do not lift heavy objects. This is anything weighing over 10 lb (4.5 kg).  Ask your doctor when it is okay to:  Go back to work or school.  Do physical activities or play sports.  Have sex.  Eat a diet that is healthy for your heart. This includes:  Fresh fruits and  vegetables.  Lean cuts of meat.  Avoid:  Foods high in salt.  Canned or very processed foods.  Food that is high in saturated fat or sugar.  Fried food.  Make other healthy changes as told by your doctor, which may include these:  Do not smoke, chew tobacco, or use electronic cigarettes.  Keep a healthy weight.  Exercise often.  Manage your blood pressure.  Limit how much alcohol you drink.  Manage other health problems, such as diabetes and sleep apnea.  Keep all follow-up visits as told by your doctor. This is important. GET HELP IF:  You have a fever.  You have chills.  You have more bleeding from the area where the tube was inserted. Hold pressure on the area. GET HELP RIGHT AWAY IF:  You have vision changes or loss of vision.  You have numbness or weakness on one side of your body.  You have trouble talking, or you have slurred speech or cannot speak.  You feel confused or have trouble remembering.  You have a lot of pain at the area where the tube was inserted.  You have redness, warmth, or swelling at the area where the tube was inserted.  You have fluid coming from the area where the tube was inserted. This is more than a small amount of blood on the bandage.  The area where the tube was inserted is bleeding, and the bleeding does not stop after 30 minutes of holding steady pressure on the area. These symptoms may be an emergency. Do not wait to see if the symptoms will go away. Get help right away. Call your local emergency services (911 in [place] Do not drive yourself to the hospital.   This information is not intended to replace advice given to you by your health care provider. Make sure you discuss any questions you have with your health care provider.   Document Released: 04/25/2013 Document Revised: 05/11/2014 Document Reviewed: 04/25/2013 Elsevier Interactive Patient Education [employer].

## 2015-11-08 NOTE — Telephone Encounter (Signed)
Spoke to pt to sch appt 7/24 at 1:15.

## 2015-11-09 NOTE — Anesthesia Postprocedure Evaluation (Signed)
Anesthesia Post Note  Patient: Wyatt Sandoval  Procedure(s) Performed: Procedure(s) (LRB): LEFT CAROTID ENDARTERECTOMY  (Left)  Patient location during evaluation: PACU Anesthesia Type: General Level of consciousness: awake Pain management: pain level controlled Vital Signs Assessment: post-procedure vital signs reviewed and stable Respiratory status: spontaneous breathing Cardiovascular status: stable Postop Assessment: no signs of nausea or vomiting Anesthetic complications: no     Last Vitals:  Filed Vitals:   11/08/15 0747 11/08/15 0800  BP: 176/137 134/92  Pulse: 101   Temp: 37.3 C   Resp: 15     Last Pain:  Filed Vitals:   11/08/15 0822  PainSc: 7    Pain Goal: Patients Stated Pain Goal: 2 (11/08/15 0602)               Schylar Allard

## 2015-11-10 ENCOUNTER — Ambulatory Visit (HOSPITAL_COMMUNITY)
Admission: EM | Admit: 2015-11-10 | Discharge: 2015-11-10 | Disposition: A | Discharge status: Home / Self Care | Payer: Medicare Other | Attending: Family Medicine | Admitting: Family Medicine

## 2015-11-10 ENCOUNTER — Encounter (HOSPITAL_COMMUNITY): Payer: Self-pay | Admitting: Emergency Medicine

## 2015-11-10 DIAGNOSIS — S161XXA Strain of muscle, fascia and tendon at neck level, initial encounter: Secondary | ICD-10-CM

## 2015-11-10 DIAGNOSIS — M5412 Radiculopathy, cervical region: Secondary | ICD-10-CM

## 2015-11-10 MED ORDER — PREDNISONE 20 MG PO TABS
ORAL_TABLET | ORAL | Status: AC
  Filled 2015-11-10: qty 3

## 2015-11-10 MED ORDER — PREDNISONE 20 MG PO TABS: 40.0000 mg | ORAL_TABLET | Freq: Every day | ORAL | Status: DC

## 2015-11-10 MED ORDER — PREDNISONE 20 MG PO TABS
60.0000 mg | ORAL_TABLET | Freq: Once | ORAL | Status: AC
  Administered 2015-11-10: 60 mg via ORAL
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
## 2015-11-10 NOTE — ED Notes (Signed)
Prescriptions reviewed and education given regarding prednisone. Patient with no complaints at discharge.

## 2015-11-10 NOTE — Discharge Instructions (Signed)
Cervical Radiculopathy Cervical radiculopathy means that a nerve in the neck is pinched or bruised. This can cause pain or loss of feeling (numbness) that runs from your neck to your arm and fingers. HOME CARE Managing Pain  Take over-the-counter and prescription medicines only as told by your doctor.  If directed, put ice on the injured or painful area.  Put ice in a plastic bag.  Place a towel between your skin and the bag.  Leave the ice on for 20 minutes, 2-3 times per day.  If ice does not help, you can try using heat. Take a warm shower or warm bath, or use a heat pack as told by your doctor.  You may try a gentle neck and shoulder massage. Activity  Rest as needed. Follow instructions from your doctor about any activities to avoid.  Do exercises as told by your doctor or physical therapist. General Instructions   If you were given a soft collar, wear it as told by your doctor.  Use a flat pillow when you sleep.  Keep all follow-up visits as told by your doctor. This is important. GET HELP IF:  Your condition does not improve with treatment. GET HELP RIGHT AWAY IF:   Your pain gets worse and is not controlled with medicine.  You lose feeling or feel weak in your hand, arm, face, or leg.  You have a fever.  You have a stiff neck.  You cannot control when you poop or pee (have incontinence).  You have trouble with walking, balance, or talking.   This information is not intended to replace advice given to you by your health care provider. Make sure you discuss any questions you have with your health care provider.   Document Released: 04/09/2011 Document Revised: 01/09/2015 Document Reviewed: 06/14/2014 Elsevier Interactive Patient Education 2016 [employer]  Cervical Strain and Sprain With Rehab Cervical strain and sprain are injuries that commonly occur with "whiplash" injuries. Whiplash occurs when the neck is forcefully whipped backward or forward, such  as during a motor vehicle accident or during contact sports. The muscles, ligaments, tendons, discs, and nerves of the neck are susceptible to injury when this occurs. RISK FACTORS Risk of having a whiplash injury increases if:  Osteoarthritis of the spine.  Situations that make head or neck accidents or trauma more likely.  High-risk sports (football, rugby, wrestling, hockey, auto racing, gymnastics, diving, contact karate, or boxing).  Poor strength and flexibility of the neck.  Previous neck injury.  Poor tackling technique.  Improperly fitted or padded equipment. SYMPTOMS   Pain or stiffness in the front or back of neck or both.  Symptoms may present immediately or up to 24 hours after injury.  Dizziness, headache, nausea, and vomiting.  Muscle spasm with soreness and stiffness in the neck.  Tenderness and swelling at the injury site. PREVENTION  Learn and use proper technique (avoid tackling with the head, spearing, and head-butting; use proper falling techniques to avoid landing on the head).  Warm up and stretch properly before activity.  Maintain physical fitness:  Strength, flexibility, and endurance.  Cardiovascular fitness.  Wear properly fitted and padded protective equipment, such as padded soft collars, for participation in contact sports. PROGNOSIS  Recovery from cervical strain and sprain injuries is dependent on the extent of the injury. These injuries are usually curable in 1 week to 3 months with appropriate treatment.  RELATED COMPLICATIONS   Temporary numbness and weakness may occur if the nerve roots are damaged,  and this may persist until the nerve has completely healed.  Chronic pain due to frequent recurrence of symptoms.  Prolonged healing, especially if activity is resumed too soon (before complete recovery). TREATMENT  Treatment initially involves the use of ice and medication to help reduce pain and inflammation. It is also important to  perform strengthening and stretching exercises and modify activities that worsen symptoms so the injury does not get worse. These exercises may be performed at home or with a therapist. For patients who experience severe symptoms, a soft, padded collar may be recommended to be worn around the neck.  Improving your posture may help reduce symptoms. Posture improvement includes pulling your chin and abdomen in while sitting or standing. If you are sitting, sit in a firm chair with your buttocks against the back of the chair. While sleeping, try replacing your pillow with a small towel rolled to 2 inches in diameter, or use a cervical pillow or soft cervical collar. Poor sleeping positions delay healing.  For patients with nerve root damage, which causes numbness or weakness, the use of a cervical traction apparatus may be recommended. Surgery is rarely necessary for these injuries. However, cervical strain and sprains that are present at birth (congenital) may require surgery. MEDICATION   If pain medication is necessary, nonsteroidal anti-inflammatory medications, such as aspirin and ibuprofen, or other minor pain relievers, such as acetaminophen, are often recommended.  Do not take pain medication for 7 days before surgery.  Prescription pain relievers may be given if deemed necessary by your caregiver. Use only as directed and only as much as you need. HEAT AND COLD:   Cold treatment (icing) relieves pain and reduces inflammation. Cold treatment should be applied for 10 to 15 minutes every 2 to 3 hours for inflammation and pain and immediately after any activity that aggravates your symptoms. Use ice packs or an ice massage.  Heat treatment may be used prior to performing the stretching and strengthening activities prescribed by your caregiver, physical therapist, or athletic trainer. Use a heat pack or a warm soak. SEEK MEDICAL CARE IF:   Symptoms get worse or do not improve in 2 weeks despite  treatment.  New, unexplained symptoms develop (drugs used in treatment may produce side effects). EXERCISES RANGE OF MOTION (ROM) AND STRETCHING EXERCISES - Cervical Strain and Sprain These exercises may help you when beginning to rehabilitate your injury. In order to successfully resolve your symptoms, you must improve your posture. These exercises are designed to help reduce the forward-head and rounded-shoulder posture which contributes to this condition. Your symptoms may resolve with or without further involvement from your physician, physical therapist or athletic trainer. While completing these exercises, remember:   Restoring tissue flexibility helps normal motion to return to the joints. This allows healthier, less painful movement and activity.  An effective stretch should be held for at least 20 seconds, although you may need to begin with shorter hold times for comfort.  A stretch should never be painful. You should only feel a gentle lengthening or release in the stretched tissue. STRETCH- Axial Extensors  Lie on your back on the floor. You may bend your knees for comfort. Place a rolled-up hand towel or dish towel, about 2 inches in diameter, under the part of your head that makes contact with the floor.  Gently tuck your chin, as if trying to make a "double chin," until you feel a gentle stretch at the base of your head.  Hold __________ seconds.  Repeat __________ times. Complete this exercise __________ times per day.  STRETCH - Axial Extension   Stand or sit on a firm surface. Assume a good posture: chest up, shoulders drawn back, abdominal muscles slightly tense, knees unlocked (if standing) and feet hip width apart.  Slowly retract your chin so your head slides back and your chin slightly lowers. Continue to look straight ahead.  You should feel a gentle stretch in the back of your head. Be certain not to feel an aggressive stretch since this can cause headaches  later.  Hold for __________ seconds. Repeat __________ times. Complete this exercise __________ times per day. STRETCH - Cervical Side Bend   Stand or sit on a firm surface. Assume a good posture: chest up, shoulders drawn back, abdominal muscles slightly tense, knees unlocked (if standing) and feet hip width apart.  Without letting your nose or shoulders move, slowly tip your right / left ear to your shoulder until your feel a gentle stretch in the muscles on the opposite side of your neck.  Hold __________ seconds. Repeat __________ times. Complete this exercise __________ times per day. STRETCH - Cervical Rotators   Stand or sit on a firm surface. Assume a good posture: chest up, shoulders drawn back, abdominal muscles slightly tense, knees unlocked (if standing) and feet hip width apart.  Keeping your eyes level with the ground, slowly turn your head until you feel a gentle stretch along the back and opposite side of your neck.  Hold __________ seconds. Repeat __________ times. Complete this exercise __________ times per day. RANGE OF MOTION - Neck Circles   Stand or sit on a firm surface. Assume a good posture: chest up, shoulders drawn back, abdominal muscles slightly tense, knees unlocked (if standing) and feet hip width apart.  Gently roll your head down and around from the back of one shoulder to the back of the other. The motion should never be forced or painful.  Repeat the motion 10-20 times, or until you feel the neck muscles relax and loosen. Repeat __________ times. Complete the exercise __________ times per day. STRENGTHENING EXERCISES - Cervical Strain and Sprain These exercises may help you when beginning to rehabilitate your injury. They may resolve your symptoms with or without further involvement from your physician, physical therapist, or athletic trainer. While completing these exercises, remember:   Muscles can gain both the endurance and the strength needed for  everyday activities through controlled exercises.  Complete these exercises as instructed by your physician, physical therapist, or athletic trainer. Progress the resistance and repetitions only as guided.  You may experience muscle soreness or fatigue, but the pain or discomfort you are trying to eliminate should never worsen during these exercises. If this pain does worsen, stop and make certain you are following the directions exactly. If the pain is still present after adjustments, discontinue the exercise until you can discuss the trouble with your clinician. STRENGTH - Cervical Flexors, Isometric  Face a wall, standing about 6 inches away. Place a small pillow, a ball about 6-8 inches in diameter, or a folded towel between your forehead and the wall.  Slightly tuck your chin and gently push your forehead into the soft object. Push only with mild to moderate intensity, building up tension gradually. Keep your jaw and forehead relaxed.  Hold 10 to 20 seconds. Keep your breathing relaxed.  Release the tension slowly. Relax your neck muscles completely before you start the next repetition. Repeat __________ times. Complete this exercise __________  times per day. STRENGTH- Cervical Lateral Flexors, Isometric   Stand about 6 inches away from a wall. Place a small pillow, a ball about 6-8 inches in diameter, or a folded towel between the side of your head and the wall.  Slightly tuck your chin and gently tilt your head into the soft object. Push only with mild to moderate intensity, building up tension gradually. Keep your jaw and forehead relaxed.  Hold 10 to 20 seconds. Keep your breathing relaxed.  Release the tension slowly. Relax your neck muscles completely before you start the next repetition. Repeat __________ times. Complete this exercise __________ times per day. STRENGTH - Cervical Extensors, Isometric   Stand about 6 inches away from a wall. Place a small pillow, a ball about  6-8 inches in diameter, or a folded towel between the back of your head and the wall.  Slightly tuck your chin and gently tilt your head back into the soft object. Push only with mild to moderate intensity, building up tension gradually. Keep your jaw and forehead relaxed.  Hold 10 to 20 seconds. Keep your breathing relaxed.  Release the tension slowly. Relax your neck muscles completely before you start the next repetition. Repeat __________ times. Complete this exercise __________ times per day. POSTURE AND BODY MECHANICS CONSIDERATIONS - Cervical Strain and Sprain Keeping correct posture when sitting, standing or completing your activities will reduce the stress put on different body tissues, allowing injured tissues a chance to heal and limiting painful experiences. The following are general guidelines for improved posture. Your physician or physical therapist will provide you with any instructions specific to your needs. While reading these guidelines, remember:  The exercises prescribed by your provider will help you have the flexibility and strength to maintain correct postures.  The correct posture provides the optimal environment for your joints to work. All of your joints have less wear and tear when properly supported by a spine with good posture. This means you will experience a healthier, less painful body.  Correct posture must be practiced with all of your activities, especially prolonged sitting and standing. Correct posture is as important when doing repetitive low-stress activities (typing) as it is when doing a single heavy-load activity (lifting). PROLONGED STANDING WHILE SLIGHTLY LEANING FORWARD When completing a task that requires you to lean forward while standing in one place for a long time, place either foot up on a stationary 2- to 4-inch high object to help maintain the best posture. When both feet are on the ground, the low back tends to lose its slight inward curve. If  this curve flattens (or becomes too large), then the back and your other joints will experience too much stress, fatigue more quickly, and can cause pain.  RESTING POSITIONS Consider which positions are most painful for you when choosing a resting position. If you have pain with flexion-based activities (sitting, bending, stooping, squatting), choose a position that allows you to rest in a less flexed posture. You would want to avoid curling into a fetal position on your side. If your pain worsens with extension-based activities (prolonged standing, working overhead), avoid resting in an extended position such as sleeping on your stomach. Most people will find more comfort when they rest with their spine in a more neutral position, neither too rounded nor too arched. Lying on a non-sagging bed on your side with a pillow between your knees, or on your back with a pillow under your knees will often provide some relief. Keep in  mind, being in any one position for a prolonged period of time, no matter how correct your posture, can still lead to stiffness. WALKING Walk with an upright posture. Your ears, shoulders, and hips should all line up. OFFICE WORK When working at a desk, create an environment that supports good, upright posture. Without extra support, muscles fatigue and lead to excessive strain on joints and other tissues. CHAIR:  A chair should be able to slide under your desk when your back makes contact with the back of the chair. This allows you to work closely.  The chair's height should allow your eyes to be level with the upper part of your monitor and your hands to be slightly lower than your elbows.  Body position:  Your feet should make contact with the floor. If this is not possible, use a foot rest.  Keep your ears over your shoulders. This will reduce stress on your neck and low back.   This information is not intended to replace advice given to you by your health care provider.  Make sure you discuss any questions you have with your health care provider.   Document Released: 04/20/2005 Document Revised: 05/11/2014 Document Reviewed: 08/02/2008 Elsevier Interactive Patient Education 2016 [employer].  Technical brewer After a car crash (motor vehicle collision), it is normal to have bruises and sore muscles. The first 24 hours usually feel the worst. After that, you will likely start to feel better each day. HOME CARE  Put ice on the injured area.  Put ice in a plastic bag.  Place a towel between your skin and the bag.  Leave the ice on for 15-20 minutes, 03-04 times a day.  Drink enough fluids to keep your pee (urine) clear or pale yellow.  Do not drink alcohol.  Take a warm shower or bath 1 or 2 times a day. This helps your sore muscles.  Return to activities as told by your doctor. Be careful when lifting. Lifting can make neck or back pain worse.  Only take medicine as told by your doctor. Do not use aspirin. GET HELP RIGHT AWAY IF:   Your arms or legs tingle, feel weak, or lose feeling (numbness).  You have headaches that do not get better with medicine.  You have neck pain, especially in the middle of the back of your neck.  You cannot control when you pee (urinate) or poop (bowel movement).  Pain is getting worse in any part of your body.  You are short of breath, dizzy, or pass out (faint).  You have chest pain.  You feel sick to your stomach (nauseous), throw up (vomit), or sweat.  You have belly (abdominal) pain that gets worse.  There is blood in your pee, poop, or throw up.  You have pain in your shoulder (shoulder strap areas).  Your problems are getting worse. MAKE SURE YOU:   Understand these instructions.  Will watch your condition.  Will get help right away if you are not doing well or get worse.   This information is not intended to replace advice given to you by your health care provider. Make sure you  discuss any questions you have with your health care provider.   Document Released: 10/07/2007 Document Revised: 07/13/2011 Document Reviewed: 09/17/2010 Elsevier Interactive Patient Education 2016 [employer] Inc[employer]  Radicular Pain Radicular pain in either the arm or leg is usually from a bulging or herniated disk in the spine. A piece of the herniated disk may press  against the nerves as the nerves exit the spine. This causes pain which is felt at the tips of the nerves down the arm or leg. Other causes of radicular pain may include:  Fractures.  Heart disease.  Cancer.  An abnormal and usually degenerative state of the nervous system or nerves (neuropathy). Diagnosis may require CT or MRI scanning to determine the primary cause.  Nerves that start at the neck (nerve roots) may cause radicular pain in the outer shoulder and arm. It can spread down to the thumb and fingers. The symptoms vary depending on which nerve root has been affected. In most cases radicular pain improves with conservative treatment. Neck problems may require physical therapy, a neck collar, or cervical traction. Treatment may take many weeks, and surgery may be considered if the symptoms do not improve.  Conservative treatment is also recommended for sciatica. Sciatica causes pain to radiate from the lower back or buttock area down the leg into the foot. Often there is a history of back problems. Most patients with sciatica are better after 2 to 4 weeks of rest and other supportive care. Short term bed rest can reduce the disk pressure considerably. Sitting, however, is not a good position since this increases the pressure on the disk. You should avoid bending, lifting, and all other activities which make the problem worse. Traction can be used in severe cases. Surgery is usually reserved for patients who do not improve within the first months of treatment. Only take over-the-counter or prescription medicines for pain,  discomfort, or fever as directed by your caregiver. Narcotics and muscle relaxants may help by relieving more severe pain and spasm and by providing mild sedation. Cold or massage can give significant relief. Spinal manipulation is not recommended. It can increase the degree of disc protrusion. Epidural steroid injections are often effective treatment for radicular pain. These injections deliver medicine to the spinal nerve in the space between the protective covering of the spinal cord and back bones (vertebrae). Your caregiver can give you more information about steroid injections. These injections are most effective when given within two weeks of the onset of pain.  You should see your caregiver for follow up care as recommended. A program for neck and back injury rehabilitation with stretching and strengthening exercises is an important part of management.  SEEK IMMEDIATE MEDICAL CARE IF:  You develop increased pain, weakness, or numbness in your arm or leg.  You develop difficulty with bladder or bowel control.  You develop abdominal pain.   This information is not intended to replace advice given to you by your health care provider. Make sure you discuss any questions you have with your health care provider.   Document Released: 05/28/2004 Document Revised: 05/11/2014 Document Reviewed: 11/14/2014 Elsevier Interactive Patient Education [employer].

## 2015-11-12 ENCOUNTER — Encounter: Payer: Self-pay | Admitting: Obstetrics and Gynecology

## 2015-11-12 ENCOUNTER — Ambulatory Visit (INDEPENDENT_AMBULATORY_CARE_PROVIDER_SITE_OTHER): Payer: 59 | Admitting: Obstetrics and Gynecology

## 2015-11-12 ENCOUNTER — Ambulatory Visit
Admission: RE | Admit: 2015-11-12 | Discharge: 2015-11-12 | Disposition: A | Discharge status: Home / Self Care | Payer: 59 | Source: Ambulatory Visit | Attending: Family Medicine | Admitting: Family Medicine

## 2015-11-12 VITALS — BP 124/77 | HR 97 | Temp 98.0°F | Wt 203.0 lb

## 2015-11-12 DIAGNOSIS — M542 Cervicalgia: Secondary | ICD-10-CM | POA: Diagnosis present

## 2015-11-12 DIAGNOSIS — I6523 Occlusion and stenosis of bilateral carotid arteries: Secondary | ICD-10-CM

## 2015-11-12 DIAGNOSIS — I779 Disorder of arteries and arterioles, unspecified: Secondary | ICD-10-CM | POA: Diagnosis not present

## 2015-11-12 DIAGNOSIS — I739 Peripheral vascular disease, unspecified: Secondary | ICD-10-CM

## 2015-11-12 NOTE — Patient Instructions (Signed)
Letter for work written Please follow-up with surgeon for clearance Go over to receive neck x-ray; I will call you about those results  Cervical Sprain A cervical sprain is when the tissues (ligaments) that hold the neck bones in place stretch or tear. HOME CARE   Put ice on the injured area.  Put ice in a plastic bag.  Place a towel between your skin and the bag.  Leave the ice on for 15-20 minutes, 3-4 times a day.  You may have been given a collar to wear. This collar keeps your neck from moving while you heal.  Do not take the collar off unless told by your doctor.  If you have long hair, keep it outside of the collar.  Ask your doctor before changing the position of your collar. You may need to change its position over time to make it more comfortable.  If you are allowed to take off the collar for cleaning or bathing, follow your doctor's instructions on how to do it safely.  Keep your collar clean by wiping it with mild soap and water. Dry it completely. If the collar has removable pads, remove them every 1-2 days to hand wash them with soap and water. Allow them to air dry. They should be dry before you wear them in the collar.  Do not drive while wearing the collar.  Only take medicine as told by your doctor.  Keep all doctor visits as told.  Keep all physical therapy visits as told.  Adjust your work station so that you have good posture while you work.  Avoid positions and activities that make your problems worse.  Warm up and stretch before being active. GET HELP IF:  Your pain is not controlled with medicine.  You cannot take less pain medicine over time as planned.  Your activity level does not improve as expected. GET HELP RIGHT AWAY IF:   You are bleeding.  Your stomach is upset.  You have an allergic reaction to your medicine.  You develop new problems that you cannot explain.  You lose feeling (become numb) or you cannot move any part of  your body (paralysis).  You have tingling or weakness in any part of your body.  Your symptoms get worse. Symptoms include:  Pain, soreness, stiffness, puffiness (swelling), or a burning feeling in your neck.  Pain when your neck is touched.  Shoulder or upper back pain.  Limited ability to move your neck.  Headache.  Dizziness.  Your hands or arms feel week, lose feeling, or tingle.  Muscle spasms.  Difficulty swallowing or chewing. MAKE SURE YOU:   Understand these instructions.  Will watch your condition.  Will get help right away if you are not doing well or get worse.   This information is not intended to replace advice given to you by your health care provider. Make sure you discuss any questions you have with your health care provider.   Document Released: 10/07/2007 Document Revised: 12/21/2012 Document Reviewed: 10/26/2012 Elsevier Interactive Patient Education [employer].
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
## 2015-11-13 ENCOUNTER — Telehealth: Payer: Self-pay | Admitting: *Deleted

## 2015-11-13 NOTE — Telephone Encounter (Signed)
Prior Authorization received from [facility] for Lidocaine 5% patch. Formulary and PA form placed in provider box for completion. Derl Barrow, RN

## 2015-11-21 ENCOUNTER — Encounter: Payer: Self-pay | Admitting: Surgery

## 2015-11-25 ENCOUNTER — Encounter: Payer: Self-pay | Admitting: Surgery

## 2015-11-25 ENCOUNTER — Ambulatory Visit (INDEPENDENT_AMBULATORY_CARE_PROVIDER_SITE_OTHER): Payer: 59 | Admitting: Surgery

## 2015-11-25 VITALS — BP 116/83 | HR 86 | Temp 98.6°F | Resp 16 | Ht 67.0 in | Wt 203.0 lb

## 2015-11-25 DIAGNOSIS — I6523 Occlusion and stenosis of bilateral carotid arteries: Secondary | ICD-10-CM
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
## 2015-11-28 ENCOUNTER — Ambulatory Visit: Payer: 59 | Admitting: Family Medicine

## 2015-12-02 ENCOUNTER — Encounter: Payer: Self-pay | Admitting: Family Medicine

## 2015-12-02 ENCOUNTER — Ambulatory Visit (INDEPENDENT_AMBULATORY_CARE_PROVIDER_SITE_OTHER): Payer: 59 | Admitting: Family Medicine

## 2015-12-02 VITALS — BP 132/79 | HR 99 | Temp 98.6°F | Ht 67.0 in | Wt 197.8 lb

## 2015-12-02 DIAGNOSIS — E119 Type 2 diabetes mellitus without complications: Secondary | ICD-10-CM | POA: Diagnosis not present

## 2015-12-02 DIAGNOSIS — M542 Cervicalgia: Secondary | ICD-10-CM | POA: Diagnosis not present

## 2015-12-02 MED ORDER — VARENICLINE TARTRATE 0.5 MG X 11 & 1 MG X 42 PO MISC: ORAL | 0 refills | Status: DC

## 2015-12-02 NOTE — Patient Instructions (Signed)
Thank you so much for coming to visit today! Please continue Metformin. Check Blood Sugars twice daily. Please return in October for A1C check. I have ordered an MRI of your neck so we can get some more information. We will contact you to set up an appointment.  Dr. Gerlean Ren

## 2015-12-04 DIAGNOSIS — M542 Cervicalgia: Secondary | ICD-10-CM | POA: Insufficient documentation

## 2015-12-04 NOTE — Assessment & Plan Note (Signed)
-   Will obtain MRI given continuing symptoms and radiology recommendation - To return if symptoms worsen
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
## 2015-12-04 NOTE — Assessment & Plan Note (Signed)
-   Last A1C 7.8 in 11/2015. Next due in 02/2016 - Recommend only checking blood sugar twice daily. To record readings and bring to next visit. - Continue Metformin 1000mg  twice daily - Follow up in 3 months

## 2015-12-12 ENCOUNTER — Ambulatory Visit
Admission: RE | Admit: 2015-12-12 | Discharge: 2015-12-12 | Disposition: A | Discharge status: Home / Self Care | Payer: Medicare Other | Source: Ambulatory Visit | Attending: Family Medicine | Admitting: Family Medicine

## 2015-12-12 ENCOUNTER — Telehealth: Payer: Self-pay | Admitting: Family Medicine

## 2015-12-12 DIAGNOSIS — M542 Cervicalgia: Secondary | ICD-10-CM

## 2015-12-12 NOTE — Telephone Encounter (Signed)
Attempted to contact concerning imaging results without response. Will refer to Neurosurgery and continue to attempt contact.

## 2015-12-13 NOTE — Telephone Encounter (Signed)
Pt returned phone call. Please call back. Thanks! ep

## 2015-12-19 NOTE — Telephone Encounter (Signed)
Pt is calling back and would like to know what his MRI results were. jw

## 2015-12-20 NOTE — Telephone Encounter (Signed)
Referral notes were sent on 12/16/15 per Tia note in the referral and pt should here from them for scheduling. Katharina Caper, Rhyder Koegel D, [place]

## 2015-12-20 NOTE — Telephone Encounter (Signed)
Contacted concerning lab results. Awaiting contact by Neurosurgery to set up appointment. Symptoms somewhat improving but still notes pain and some tightness. Discussed letting office know if symptoms worsen. Recommended against Chiropractor until evaluated by Neurosurgery.

## 2015-12-26 ENCOUNTER — Ambulatory Visit: Payer: Medicare Other | Admitting: Internal Medicine

## 2016-01-09 ENCOUNTER — Other Ambulatory Visit: Payer: Self-pay | Admitting: Family Medicine

## 2016-01-10 MED ORDER — METOPROLOL TARTRATE 25 MG PO TABS: 12.5000 mg | ORAL_TABLET | Freq: Two times a day (BID) | ORAL | 0 refills | Status: DC

## 2016-01-14 ENCOUNTER — Telehealth: Payer: Self-pay | Admitting: Family Medicine

## 2016-01-14 NOTE — Telephone Encounter (Signed)
Pharmacy says they never received the Chantix RX. Could you resend it?  [facility] on [address]

## 2016-01-16 ENCOUNTER — Other Ambulatory Visit: Payer: Self-pay | Admitting: Family Medicine

## 2016-01-16 DIAGNOSIS — F17200 Nicotine dependence, unspecified, uncomplicated: Secondary | ICD-10-CM

## 2016-01-16 MED ORDER — VARENICLINE TARTRATE 0.5 MG PO TABS: ORAL_TABLET | ORAL | 0 refills | Status: DC

## 2016-01-20 ENCOUNTER — Other Ambulatory Visit: Payer: Self-pay | Admitting: *Deleted

## 2016-01-20 DIAGNOSIS — G4700 Insomnia, unspecified: Secondary | ICD-10-CM

## 2016-01-20 DIAGNOSIS — E119 Type 2 diabetes mellitus without complications: Secondary | ICD-10-CM

## 2016-01-21 ENCOUNTER — Encounter: Payer: Self-pay | Admitting: *Deleted

## 2016-01-21 ENCOUNTER — Telehealth: Payer: Self-pay | Admitting: Surgery

## 2016-01-21 MED ORDER — TRAZODONE HCL 50 MG PO TABS: 25.0000 mg | ORAL_TABLET | Freq: Every evening | ORAL | 3 refills | Status: DC | PRN

## 2016-01-21 MED ORDER — METFORMIN HCL 1000 MG PO TABS: 1000.0000 mg | ORAL_TABLET | Freq: Two times a day (BID) | ORAL | 2 refills | Status: DC

## 2016-01-21 NOTE — Telephone Encounter (Signed)
I faxed a copy of the surgical clearance letter to Dr.Botero's office at [facility]: [address]  fax#[phone]. I also notified the patient of this and mailed a copy to him as well. AWT

## 2016-01-21 NOTE — Telephone Encounter (Signed)
-----   Message from Serafina Mitchell, MD sent at 01/17/2016  8:24 PM EDT ----- Regarding: RE: Clearance for upcoming neck surgery Contact: [place] for surgery ----- Message ----- From: Lujean Amel Sent: 01/15/2016   2:32 PM To: Serafina Mitchell, MD, Mena Goes, RN Subject: Clearance for upcoming neck surgery            Dr.Brabham, This patient has not been scheduled for neck/spine surgery w/ Dr.Botero @ [facility] but will require surgical intervention soon per the patient. He had a recent MRI as well.  Do you need to see him in the office prior to giving the okay for this surgery? And should he have a carotid US? Or can you dictate a letter for clearance? Please advise and I will call the patient with that information.   Thanks, [facility] ph# [phone]

## 2016-01-21 NOTE — Telephone Encounter (Signed)
2nd request.  Yousof Alderman L, RN  

## 2016-01-28 ENCOUNTER — Other Ambulatory Visit: Payer: Self-pay | Admitting: *Deleted

## 2016-01-28 MED ORDER — SIMVASTATIN 40 MG PO TABS: ORAL_TABLET | ORAL | 0 refills | Status: DC

## 2016-01-30 ENCOUNTER — Other Ambulatory Visit: Payer: Self-pay | Admitting: *Deleted

## 2016-01-30 MED ORDER — AMITRIPTYLINE HCL 150 MG PO TABS: 150.0000 mg | ORAL_TABLET | Freq: Every day | ORAL | 0 refills | Status: DC

## 2016-03-19 ENCOUNTER — Other Ambulatory Visit: Payer: Self-pay | Admitting: *Deleted

## 2016-03-19 DIAGNOSIS — I6523 Occlusion and stenosis of bilateral carotid arteries: Secondary | ICD-10-CM

## 2016-03-23 ENCOUNTER — Other Ambulatory Visit: Payer: Self-pay | Admitting: Neurosurgery

## 2016-03-23 ENCOUNTER — Other Ambulatory Visit: Payer: Self-pay | Admitting: *Deleted

## 2016-03-23 DIAGNOSIS — K219 Gastro-esophageal reflux disease without esophagitis: Secondary | ICD-10-CM

## 2016-03-23 MED ORDER — OMEPRAZOLE 40 MG PO CPDR: 40.0000 mg | DELAYED_RELEASE_CAPSULE | Freq: Every day | ORAL | 3 refills | Status: DC

## 2016-03-31 ENCOUNTER — Encounter: Payer: Self-pay | Admitting: Internal Medicine
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
## 2016-04-06 ENCOUNTER — Encounter (INDEPENDENT_AMBULATORY_CARE_PROVIDER_SITE_OTHER): Payer: Self-pay

## 2016-04-06 ENCOUNTER — Encounter: Payer: Self-pay | Admitting: Internal Medicine

## 2016-04-06 ENCOUNTER — Ambulatory Visit (INDEPENDENT_AMBULATORY_CARE_PROVIDER_SITE_OTHER): Payer: Medicare Other | Admitting: Internal Medicine

## 2016-04-06 VITALS — BP 122/66 | HR 97 | Ht 67.0 in | Wt 189.2 lb

## 2016-04-06 DIAGNOSIS — I10 Essential (primary) hypertension: Secondary | ICD-10-CM | POA: Diagnosis not present

## 2016-04-06 DIAGNOSIS — E785 Hyperlipidemia, unspecified: Secondary | ICD-10-CM | POA: Diagnosis not present

## 2016-04-06 DIAGNOSIS — I6523 Occlusion and stenosis of bilateral carotid arteries: Secondary | ICD-10-CM

## 2016-04-06 DIAGNOSIS — I739 Peripheral vascular disease, unspecified: Principal | ICD-10-CM

## 2016-04-06 DIAGNOSIS — I779 Disorder of arteries and arterioles, unspecified: Secondary | ICD-10-CM

## 2016-04-06 NOTE — Patient Instructions (Signed)
Your physician recommends that you continue on your current medications as directed. Please refer to the Current Medication list given to you today. Your physician wants you to follow-up in: 9 MONTHS WITH DR ROSS.  You will receive a reminder letter in the mail two months in advance. If you don't receive a letter, please call our office to schedule the follow-up appointment.  

## 2016-04-15 ENCOUNTER — Encounter (HOSPITAL_COMMUNITY): Payer: Self-pay | Admitting: *Deleted

## 2016-04-15 ENCOUNTER — Emergency Department (HOSPITAL_COMMUNITY)
Admission: EM | Admit: 2016-04-15 | Discharge: 2016-04-15 | Disposition: A | Discharge status: Home / Self Care | Payer: No Typology Code available for payment source | Attending: Emergency Medicine | Admitting: Emergency Medicine

## 2016-04-15 ENCOUNTER — Emergency Department (HOSPITAL_COMMUNITY): Payer: No Typology Code available for payment source

## 2016-04-15 DIAGNOSIS — Y92410 Unspecified street and highway as the place of occurrence of the external cause: Secondary | ICD-10-CM | POA: Diagnosis not present

## 2016-04-15 DIAGNOSIS — I10 Essential (primary) hypertension: Secondary | ICD-10-CM | POA: Diagnosis not present

## 2016-04-15 DIAGNOSIS — Z7984 Long term (current) use of oral hypoglycemic drugs: Secondary | ICD-10-CM | POA: Diagnosis not present

## 2016-04-15 DIAGNOSIS — M7918 Myalgia, other site: Secondary | ICD-10-CM

## 2016-04-15 DIAGNOSIS — Z7982 Long term (current) use of aspirin: Secondary | ICD-10-CM | POA: Diagnosis not present

## 2016-04-15 DIAGNOSIS — R51 Headache: Secondary | ICD-10-CM | POA: Diagnosis not present

## 2016-04-15 DIAGNOSIS — Y939 Activity, unspecified: Secondary | ICD-10-CM | POA: Insufficient documentation

## 2016-04-15 DIAGNOSIS — M25512 Pain in left shoulder: Secondary | ICD-10-CM

## 2016-04-15 DIAGNOSIS — S4992XA Unspecified injury of left shoulder and upper arm, initial encounter: Secondary | ICD-10-CM | POA: Diagnosis not present

## 2016-04-15 DIAGNOSIS — Y999 Unspecified external cause status: Secondary | ICD-10-CM | POA: Diagnosis not present

## 2016-04-15 DIAGNOSIS — Z87891 Personal history of nicotine dependence: Secondary | ICD-10-CM | POA: Insufficient documentation

## 2016-04-15 DIAGNOSIS — E119 Type 2 diabetes mellitus without complications: Secondary | ICD-10-CM | POA: Diagnosis not present

## 2016-04-15 MED ORDER — METHOCARBAMOL 500 MG PO TABS: 500.0000 mg | ORAL_TABLET | Freq: Two times a day (BID) | ORAL | 0 refills | Status: DC

## 2016-04-15 MED ORDER — MORPHINE SULFATE (PF) 4 MG/ML IV SOLN
4.0000 mg | Freq: Once | INTRAVENOUS | Status: AC
  Administered 2016-04-15: 4 mg via INTRAVENOUS
  Filled 2016-04-15: qty 1
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
## 2016-04-15 NOTE — Discharge Planning (Signed)
Pt up for discharge. EDCM reviewed chart for possible CM needs.  No needs identified or communicated.  
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
## 2016-04-20 ENCOUNTER — Encounter: Payer: Self-pay | Admitting: Family Medicine

## 2016-04-20 ENCOUNTER — Ambulatory Visit (INDEPENDENT_AMBULATORY_CARE_PROVIDER_SITE_OTHER): Payer: Medicare Other | Admitting: Family Medicine

## 2016-04-20 VITALS — BP 138/82 | HR 107 | Temp 98.1°F | Wt 190.6 lb

## 2016-04-20 DIAGNOSIS — I10 Essential (primary) hypertension: Secondary | ICD-10-CM | POA: Diagnosis not present

## 2016-04-20 DIAGNOSIS — Z23 Encounter for immunization: Secondary | ICD-10-CM

## 2016-04-20 DIAGNOSIS — I6523 Occlusion and stenosis of bilateral carotid arteries: Secondary | ICD-10-CM

## 2016-04-20 DIAGNOSIS — E119 Type 2 diabetes mellitus without complications: Secondary | ICD-10-CM

## 2016-04-20 DIAGNOSIS — F17200 Nicotine dependence, unspecified, uncomplicated: Secondary | ICD-10-CM

## 2016-04-20 DIAGNOSIS — G8929 Other chronic pain: Secondary | ICD-10-CM | POA: Diagnosis not present

## 2016-04-20 DIAGNOSIS — M5442 Lumbago with sciatica, left side: Secondary | ICD-10-CM

## 2016-04-20 LAB — POCT GLYCOSYLATED HEMOGLOBIN (HGB A1C): Hemoglobin A1C: 6.6

## 2016-04-20 MED ORDER — METOPROLOL TARTRATE 25 MG PO TABS: 25.0000 mg | ORAL_TABLET | Freq: Two times a day (BID) | ORAL | 3 refills | Status: DC

## 2016-04-20 MED ORDER — VARENICLINE TARTRATE 0.5 MG PO TABS: ORAL_TABLET | ORAL | 2 refills | Status: DC
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
## 2016-04-21 ENCOUNTER — Ambulatory Visit: Admit: 2016-04-21 | Payer: PRIVATE HEALTH INSURANCE | Admitting: Neurosurgery

## 2016-04-21 SURGERY — ANTERIOR CERVICAL DECOMPRESSION/DISCECTOMY FUSION 2 LEVELS
Anesthesia: General

## 2016-04-22 ENCOUNTER — Encounter: Payer: Self-pay | Admitting: Physical Therapy

## 2016-04-22 ENCOUNTER — Ambulatory Visit: Payer: No Typology Code available for payment source | Attending: Family Medicine | Admitting: Physical Therapy

## 2016-04-22 ENCOUNTER — Ambulatory Visit (HOSPITAL_COMMUNITY)
Admission: RE | Admit: 2016-04-22 | Discharge: 2016-04-22 | Disposition: A | Discharge status: Home / Self Care | Payer: Medicare Other | Source: Ambulatory Visit | Attending: Family Medicine | Admitting: Family Medicine

## 2016-04-22 DIAGNOSIS — M62830 Muscle spasm of back: Secondary | ICD-10-CM

## 2016-04-22 DIAGNOSIS — M5442 Lumbago with sciatica, left side: Secondary | ICD-10-CM | POA: Insufficient documentation

## 2016-04-22 DIAGNOSIS — M5137 Other intervertebral disc degeneration, lumbosacral region: Secondary | ICD-10-CM | POA: Diagnosis not present

## 2016-04-22 DIAGNOSIS — G8929 Other chronic pain: Secondary | ICD-10-CM | POA: Diagnosis not present

## 2016-04-22 DIAGNOSIS — I700 Atherosclerosis of aorta: Secondary | ICD-10-CM | POA: Insufficient documentation

## 2016-04-22 DIAGNOSIS — M2578 Osteophyte, vertebrae: Secondary | ICD-10-CM | POA: Diagnosis not present

## 2016-04-22 DIAGNOSIS — R262 Difficulty in walking, not elsewhere classified: Secondary | ICD-10-CM
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
## 2016-05-05 ENCOUNTER — Encounter: Payer: Self-pay | Admitting: Physical Therapy

## 2016-05-05 ENCOUNTER — Ambulatory Visit: Payer: Medicare Other | Attending: Family Medicine | Admitting: Physical Therapy

## 2016-05-05 DIAGNOSIS — M6281 Muscle weakness (generalized): Secondary | ICD-10-CM | POA: Diagnosis present

## 2016-05-05 DIAGNOSIS — M62838 Other muscle spasm: Secondary | ICD-10-CM | POA: Insufficient documentation

## 2016-05-05 DIAGNOSIS — M62830 Muscle spasm of back: Secondary | ICD-10-CM | POA: Insufficient documentation

## 2016-05-05 DIAGNOSIS — R262 Difficulty in walking, not elsewhere classified: Secondary | ICD-10-CM | POA: Diagnosis present

## 2016-05-05 DIAGNOSIS — G8929 Other chronic pain: Secondary | ICD-10-CM | POA: Insufficient documentation

## 2016-05-05 DIAGNOSIS — M5442 Lumbago with sciatica, left side: Secondary | ICD-10-CM | POA: Insufficient documentation

## 2016-05-05 NOTE — Therapy (Signed)
[place], [place], [address] Phone: [phone]   Fax:  [phone]  Patient Details  Name: Wyatt Sandoval MRN: [id number] Date of Birth: Nov 20, 1960 Referring Provider:  Eloise Levels, MD  Encounter Date: 05/05/2016   Carney Living PT DPT  05/05/2016, 5:00 PM  [facility] [address] [place], [place], [address] Phone: [phone]   Fax:  [phone]

## 2016-05-07 ENCOUNTER — Other Ambulatory Visit: Payer: Self-pay | Admitting: Family Medicine

## 2016-05-07 DIAGNOSIS — F17200 Nicotine dependence, unspecified, uncomplicated: Secondary | ICD-10-CM

## 2016-05-07 DIAGNOSIS — I10 Essential (primary) hypertension: Secondary | ICD-10-CM

## 2016-05-07 DIAGNOSIS — E119 Type 2 diabetes mellitus without complications: Secondary | ICD-10-CM

## 2016-05-07 MED ORDER — ENALAPRIL MALEATE 5 MG PO TABS: 5.0000 mg | ORAL_TABLET | Freq: Every day | ORAL | 3 refills | Status: DC

## 2016-05-07 MED ORDER — AMITRIPTYLINE HCL 150 MG PO TABS: 150.0000 mg | ORAL_TABLET | Freq: Every day | ORAL | 0 refills | Status: DC

## 2016-05-07 MED ORDER — ASPIRIN 81 MG PO TABS: 81.0000 mg | ORAL_TABLET | Freq: Every day | ORAL | 11 refills | Status: DC

## 2016-05-07 MED ORDER — LUMIGAN 0.01 % OP SOLN: OPHTHALMIC | 11 refills | Status: DC

## 2016-05-07 MED ORDER — OXYCODONE HCL 15 MG PO TABS: 15.0000 mg | ORAL_TABLET | ORAL | 0 refills | Status: AC | PRN

## 2016-05-07 MED ORDER — METOPROLOL TARTRATE 25 MG PO TABS: 25.0000 mg | ORAL_TABLET | Freq: Two times a day (BID) | ORAL | 3 refills | Status: DC

## 2016-05-07 MED ORDER — VARENICLINE TARTRATE 0.5 MG PO TABS: ORAL_TABLET | ORAL | 2 refills | Status: DC

## 2016-05-07 MED ORDER — NITROGLYCERIN 0.4 MG SL SUBL: 0.4000 mg | SUBLINGUAL_TABLET | SUBLINGUAL | 0 refills | Status: DC | PRN

## 2016-05-07 MED ORDER — TRAZODONE HCL 50 MG PO TABS: 25.0000 mg | ORAL_TABLET | Freq: Every evening | ORAL | 3 refills | Status: DC | PRN

## 2016-05-07 MED ORDER — METHOCARBAMOL 500 MG PO TABS: 500.0000 mg | ORAL_TABLET | Freq: Two times a day (BID) | ORAL | 0 refills | Status: DC

## 2016-05-07 MED ORDER — METFORMIN HCL 1000 MG PO TABS: 1000.0000 mg | ORAL_TABLET | Freq: Two times a day (BID) | ORAL | 2 refills | Status: DC

## 2016-05-07 MED ORDER — GABAPENTIN 300 MG PO CAPS: 300.0000 mg | ORAL_CAPSULE | Freq: Every day | ORAL | 3 refills | Status: DC

## 2016-05-07 MED ORDER — SIMVASTATIN 40 MG PO TABS: ORAL_TABLET | ORAL | 0 refills | Status: DC

## 2016-05-07 NOTE — Telephone Encounter (Signed)
Pt needs everything refilled. Pt uses [facility] on [address] ep

## 2016-05-08 ENCOUNTER — Ambulatory Visit: Payer: Medicare Other | Admitting: Physical Therapy

## 2016-05-08 ENCOUNTER — Encounter: Payer: Self-pay | Admitting: Physical Therapy

## 2016-05-08 DIAGNOSIS — R262 Difficulty in walking, not elsewhere classified: Secondary | ICD-10-CM

## 2016-05-08 DIAGNOSIS — M5442 Lumbago with sciatica, left side: Secondary | ICD-10-CM

## 2016-05-08 DIAGNOSIS — G8929 Other chronic pain: Secondary | ICD-10-CM

## 2016-05-08 DIAGNOSIS — M6281 Muscle weakness (generalized): Secondary | ICD-10-CM

## 2016-05-08 DIAGNOSIS — M62830 Muscle spasm of back: Secondary | ICD-10-CM

## 2016-05-08 DIAGNOSIS — M62838 Other muscle spasm: Secondary | ICD-10-CM
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
## 2016-05-11 ENCOUNTER — Ambulatory Visit: Payer: Medicare Other | Admitting: Physical Therapy

## 2016-05-11 DIAGNOSIS — R262 Difficulty in walking, not elsewhere classified: Secondary | ICD-10-CM

## 2016-05-11 DIAGNOSIS — M5442 Lumbago with sciatica, left side: Secondary | ICD-10-CM | POA: Diagnosis not present

## 2016-05-11 DIAGNOSIS — M62830 Muscle spasm of back: Secondary | ICD-10-CM

## 2016-05-11 DIAGNOSIS — G8929 Other chronic pain: Secondary | ICD-10-CM

## 2016-05-11 DIAGNOSIS — M62838 Other muscle spasm: Secondary | ICD-10-CM

## 2016-05-11 DIAGNOSIS — M6281 Muscle weakness (generalized): Secondary | ICD-10-CM

## 2016-05-11 NOTE — Therapy (Signed)
[place], [place], [address] Phone: [phone]   Fax:  [phone]  Physical Therapy Treatment  Patient Details  Name: Wyatt Sandoval MRN: [id number] Date of Birth: 1961/01/16 Referring Provider: Dr Leeroy Cha  Encounter Date: 05/11/2016      PT End of Session - 05/11/16 1327    Visit Number 4   Number of Visits 16   Date for PT Re-Evaluation 07/03/16   PT Start Time 1147   PT Stop Time [id number]   PT Time Calculation (min) 48 min   Activity Tolerance Patient tolerated treatment well   Behavior During Therapy [facility] for tasks assessed/performed      Past Medical History:  Diagnosis Date  . Chronic lower back pain    stemming from multiple work accidents in mid 1990s  -- has been followed by pain clinic since then  . Daily headache   . Hepatitis C   . Hyperlipidemia   . Hypertension   . Left-sided carotid artery disease ([place])   . Type II diabetes mellitus ([place])   . Wears glasses     Past Surgical History:  Procedure Laterality Date  . ANTERIOR CERVICAL DECOMP/DISCECTOMY FUSION  2001; 2002  . BACK SURGERY    . CARDIAC CATHETERIZATION N/A 08/26/2015   Procedure: Left Heart Cath and Coronary Angiography;  Surgeon: Jettie Booze, MD;  Location: [place] [facility];  Service: Cardiovascular;  Laterality: N/A;  . CAROTID ENDARTERECTOMY Left 11/07/2015  . CARPAL TUNNEL RELEASE Right ~ 2015  . COLONOSCOPY    . ENDARTERECTOMY Left 11/07/2015   Procedure: LEFT CAROTID ENDARTERECTOMY ;  Surgeon: Serafina Mitchell, MD;  Location: [place];  Service: Vascular;  Laterality: Left;    There were no vitals filed for this visit.      Subjective Assessment - 05/11/16 1155    Subjective has 1-2 episodes a day when he can't with pain,  in arms and legs.   Currently in Pain? Yes   Pain Score 7    Pain Location Back   Pain Orientation Left   Aggravating Factors  sometmes pain starts with out cause,  lasting up  to 1 hour,  sitting on left hip   Pain Relieving Factors change weight shift to right,  getting up and moving around   Effect of Pain on Daily Activities sitting limits,   Pain Score 8   Pain Location Neck   Pain Orientation Right;Left   Pain Descriptors / Indicators Spasm;Shooting;Aching   Pain Type Surgical pain   Pain Radiating Towards right more than left,  hand right,  fingers    Aggravating Factors  getting out of bed   Pain Relieving Factors bracing head muscles real tight to get out of bed,  ( rolls to side),, moving neck   Effect of Pain on Daily Activities hard to get out of bed                          [facility] Adult PT Treatment/Exercise - 05/11/16 0001      Self-Care   Self-Care Posture;Retrograde Massage   Posture sitting more upright,  trial of floded sheet under left  while sitting,  it did not help.  cues to lift chest to align ears with shoulders to decreasepressure on neck,  sit to stand technique and sleeping ed for Quadratus lumboru   Retrograde Massage around scar     Lumbar Exercises: Stretches   Standing  Side Bend 5 reps;10 seconds   Standing Side Bend Limitations Quadratus lumborum, HEP     Manual Therapy   Manual Therapy Edema management;Soft tissue mobilization;Myofascial release;Taping   Edema Management around anterior neck scar,  tissue softened, edema reduced   Soft tissue mobilization posterior neck and paraspinals,  tissue softened and some pain reduced   Myofascial Release upper back,  tissue more mobile,  patient declines myofascial correction with kinesiotex tape due to it showing.   Lithium c 7, 50 %, I strip                PT Education - 05/11/16 1326    Education provided Yes   Education Details Education on quadratus, stretches, posture,  retrograde   Person(s) Educated Patient   Methods Explanation;Demonstration;Verbal cues;Handout   Comprehension Verbalized  understanding;Returned demonstration          PT Short Term Goals - 05/11/16 1451      PT SHORT TERM GOAL #1   Title Patient will demsotrate good core contraction    Time 4   Period Weeks   Status Unable to assess     PT SHORT TERM GOAL #2   Title Patient will increase bilateral le strength to 4+/5    Time 4   Period Weeks   Status Unable to assess     PT SHORT TERM GOAL #3   Title Patient will reporte centralized lower back pain with no pain or numbness down his lef leg    Baseline pain and numbness continue,  now goint to top of left foot   Time 4   Period Weeks   Status On-going     PT SHORT TERM GOAL #4   Title Patient will be independent with HEP    Time 4   Period Weeks   Status Unable to assess     PT SHORT TERM GOAL #5   Title Patient will increase bilateral cervical rotation by 30 degrees ( Cerival Case)    Time 4   Period Weeks   Status Unable to assess     PT SHORT TERM GOAL #6   Title Patient will increase gross bilateral shoulder strength to 4+/5 ( cervical case)    Time 4   Period Weeks   Status Unable to assess           PT Long Term Goals - 05/08/16 1237      PT LONG TERM GOAL #1   Title Patient will stand fro 30 min without increased pain in order to perform ADL's   Time 8   Period Weeks   Status On-going     PT LONG TERM GOAL #2   Title Patient will ambualte for 1 mile without increased pain    Time 8   Period Weeks   Status On-going     PT LONG TERM GOAL #3   Title Patient will increase lumbar flexion mobility by 50% in order to put his shoes on    Time 8   Period Weeks   Status On-going     PT LONG TERM GOAL #4   Title Patient will show a 46% disability on FOTO   Time 8   Period Weeks   Status On-going     PT LONG TERM GOAL #5   Title Patient will lift 3lb object overhead into cabinet in order to return to work ( Cervical Case)  Time 8   Period Weeks   Status New     Additional Long Term Goals   Additional Long  Term Goals Yes     PT LONG TERM GOAL #6   Title Patient will increase cervical rotation to 60 degrees bilateral without pain in order to improve safety when driving (Cervical case)   Time 8   Period Weeks   Status New               Plan - 05/11/16 1449    Clinical Impression Statement edema reduced and tissue softened with more mobility neck.  He reports pain is improved a little post session.  Session also included posture education with improved posture as a result.Progress toward HEP goals.    PT Next Visit Plan if patient can lie down assess hip mobility. Reviewe stretching activity, consdier seated hip/ core stabilization activity if the patient can tolerate. assess tape.    PT Home Exercise Plan seated hamstring stretch, seated piriformis stretch, lateral trunk rotation if able to lie down at home. PPT if able to lie down at home, QL stretches with instruction to keep head in neutral    Consulted and Agree with Plan of Care Patient      Patient will benefit from skilled therapeutic intervention in order to improve the following deficits and impairments:  Difficulty walking, Pain, Impaired flexibility, Increased edema, Decreased strength, Decreased mobility, Decreased activity tolerance, Decreased safety awareness, Increased muscle spasms, Decreased endurance  Visit Diagnosis: Other muscle spasm  Muscle weakness (generalized)  Chronic bilateral low back pain with left-sided sciatica  Muscle spasm of back  Difficulty in walking, not elsewhere classified     Problem List Patient Active Problem List   Diagnosis Date Noted  . Neck pain 12/04/2015  . Carotid stenosis 11/07/2015  . Left-sided carotid artery disease ([place]) 10/01/2015  . Precordial pain   . DOE (dyspnea on exertion)   . Screening for STD (sexually transmitted disease) 04/19/2015  . Contact dermatitis 02/14/2015  . Health care maintenance 03/08/2014  . Family history of prostate cancer 03/08/2014  .  Screening for prostate cancer 03/08/2014  . Tachycardia 03/07/2014  . Open-angle glaucoma 05/11/2013  . Gallbladder polyp 03/13/2013  . Preventive medication therapy needed 12/05/2012  . Dyshidrotic eczema 07/08/2012  . Hypertension 02/12/2012  . Hepatitis C 02/12/2012  . Carpal tunnel syndrome, bilateral 02/17/2011  . Chronic back pain 09/15/2010  . Diabetes mellitus without complication ([place]) [id number]  . Hyperlipidemia 07/01/2006  . TOBACCO DEPENDENCE 07/01/2006    HARRIS,KAREN PTA 05/11/2016, 2:53 PM  [facility] [address] [place], [place], [address] Phone: [phone]   Fax:  [phone]  Name: Wyatt Sandoval MRN: [id number] Date of Birth: [birth year]

## 2016-05-11 NOTE — Patient Instructions (Signed)
From exercise drawer: Quadratus stretch, positioning in bed, ans sit to stand technique As needed 5-10 second holds.  5-10 X each

## 2016-05-13 ENCOUNTER — Ambulatory Visit: Payer: Medicare Other | Admitting: Physical Therapy

## 2016-05-13 DIAGNOSIS — R262 Difficulty in walking, not elsewhere classified: Secondary | ICD-10-CM

## 2016-05-13 DIAGNOSIS — M5442 Lumbago with sciatica, left side: Secondary | ICD-10-CM

## 2016-05-13 DIAGNOSIS — M62838 Other muscle spasm: Secondary | ICD-10-CM

## 2016-05-13 DIAGNOSIS — M62830 Muscle spasm of back: Secondary | ICD-10-CM

## 2016-05-13 DIAGNOSIS — M6281 Muscle weakness (generalized): Secondary | ICD-10-CM

## 2016-05-13 DIAGNOSIS — G8929 Other chronic pain: Secondary | ICD-10-CM

## 2016-05-13 NOTE — Therapy (Signed)
[place], [place], [address] Phone: [phone]   Fax:  [phone]  Physical Therapy Treatment  Patient Details  Name: Wyatt Sandoval MRN: [id number] Date of Birth: 12-01-1960 Referring Provider: Dr Leeroy Cha  Encounter Date: 05/13/2016      PT End of Session - 05/13/16 1704    Visit Number 5   Number of Visits 16   Date for PT Re-Evaluation 07/03/16   Authorization Type Medicare  Certification for lumbar spine runs until 0000000 certification for cervicl spine runs until 07/04/2015   PT Start Time 1015   PT Stop Time 1040   PT Time Calculation (min) 25 min   Activity Tolerance Patient tolerated treatment well   Behavior During Therapy [facility] for tasks assessed/performed      Past Medical History:  Diagnosis Date  . Chronic lower back pain    stemming from multiple work accidents in mid 1990s  -- has been followed by pain clinic since then  . Daily headache   . Hepatitis C   . Hyperlipidemia   . Hypertension   . Left-sided carotid artery disease ([place])   . Type II diabetes mellitus ([place])   . Wears glasses     Past Surgical History:  Procedure Laterality Date  . ANTERIOR CERVICAL DECOMP/DISCECTOMY FUSION  2001; 2002  . BACK SURGERY    . CARDIAC CATHETERIZATION N/A 08/26/2015   Procedure: Left Heart Cath and Coronary Angiography;  Surgeon: Jettie Booze, MD;  Location: [place] [facility];  Service: Cardiovascular;  Laterality: N/A;  . CAROTID ENDARTERECTOMY Left 11/07/2015  . CARPAL TUNNEL RELEASE Right ~ 2015  . COLONOSCOPY    . ENDARTERECTOMY Left 11/07/2015   Procedure: LEFT CAROTID ENDARTERECTOMY ;  Surgeon: Serafina Mitchell, MD;  Location: [place];  Service: Vascular;  Laterality: Left;    There were no vitals filed for this visit.      Subjective Assessment - 05/13/16 1702    Subjective Patient reports his back has been a little better. He has been able to ride the exercise bike.  His neck and left arm continue to have significant pain. He felt some improvement after the last visit but it did not last very long.    Limitations Walking;Standing   How long can you sit comfortably? difficulty maintaining a position for more then a few minutes   How long can you stand comfortably? limited to less then a few minutes    How long can you walk comfortably? limited walking distances     Currently in Pain? Yes   Pain Score 7    Pain Location Back   Pain Orientation Left   Pain Descriptors / Indicators Aching;Sharp;Shooting   Pain Type Acute pain   Pain Onset More than a month ago   Pain Frequency Constant   Aggravating Factors  movement of his head or left arm    Pain Relieving Factors continue to work on weight shifting.   Effect of Pain on Daily Activities sitting    Multiple Pain Sites No                         [facility] Adult PT Treatment/Exercise - 05/13/16 0001      Neck Exercises: Standing   Other Standing Exercises wall posture 5 second hold 5x      Manual Therapy   Myofascial Release IASTYM to upper traps, myofacial release to sclaenes  PT Education - 05/13/16 1704    Education provided Yes   Education Details education on the improtance of stretching; reviewed posture    Person(s) Educated Patient   Methods Explanation;Demonstration;Verbal cues;Handout   Comprehension Verbalized understanding;Returned demonstration          PT Short Term Goals - 05/11/16 1451      PT SHORT TERM GOAL #1   Title Patient will demsotrate good core contraction    Time 4   Period Weeks   Status Unable to assess     PT SHORT TERM GOAL #2   Title Patient will increase bilateral le strength to 4+/5    Time 4   Period Weeks   Status Unable to assess     PT SHORT TERM GOAL #3   Title Patient will reporte centralized lower back pain with no pain or numbness down his lef leg    Baseline pain and numbness continue,  now goint to top  of left foot   Time 4   Period Weeks   Status On-going     PT SHORT TERM GOAL #4   Title Patient will be independent with HEP    Time 4   Period Weeks   Status Unable to assess     PT SHORT TERM GOAL #5   Title Patient will increase bilateral cervical rotation by 30 degrees ( Cerival Case)    Time 4   Period Weeks   Status Unable to assess     PT SHORT TERM GOAL #6   Title Patient will increase gross bilateral shoulder strength to 4+/5 ( cervical case)    Time 4   Period Weeks   Status Unable to assess           PT Long Term Goals - 05/08/16 1237      PT LONG TERM GOAL #1   Title Patient will stand fro 30 min without increased pain in order to perform ADL's   Time 8   Period Weeks   Status On-going     PT LONG TERM GOAL #2   Title Patient will ambualte for 1 mile without increased pain    Time 8   Period Weeks   Status On-going     PT LONG TERM GOAL #3   Title Patient will increase lumbar flexion mobility by 50% in order to put his shoes on    Time 8   Period Weeks   Status On-going     PT LONG TERM GOAL #4   Title Patient will show a 46% disability on FOTO   Time 8   Period Weeks   Status On-going     PT LONG TERM GOAL #5   Title Patient will lift 3lb object overhead into cabinet in order to return to work ( Cervical Case)    Time 8   Period Weeks   Status New     Additional Long Term Goals   Additional Long Term Goals Yes     PT LONG TERM GOAL #6   Title Patient will increase cervical rotation to 60 degrees bilateral without pain in order to improve safety when driving (Cervical case)   Time 8   Period Weeks   Status New               Plan - 05/13/16 1705    Clinical Impression Statement During treatment the patient got a phone call and had to leave/. Therapy reivewed wall posture with the pateint before  he went. He reported feeling no pain with wall paosture. Therapy focused on manual therapy to the upper traps and scalenes.    PT  Frequency 2x / week   PT Duration 8 weeks   PT Treatment/Interventions ADLs/Self Care Home Management;Cryotherapy;Electrical Stimulation;Iontophoresis 4mg /ml Dexamethasone;Moist Heat;Ultrasound;Fluidtherapy;Gait training;Stair training;Neuromuscular re-education;Balance training;Therapeutic exercise;Therapeutic activities;Functional mobility training;Patient/family education;Manual techniques;Manual lymph drainage;Passive range of motion;Energy conservation;Splinting;Taping   PT Next Visit Plan if patient can lie down assess hip mobility. Reviewe stretching activity, consdier seated hip/ core stabilization activity if the patient can tolerate. assess tape.    PT Home Exercise Plan seated hamstring stretch, seated piriformis stretch, lateral trunk rotation if able to lie down at home. PPT if able to lie down at home, QL stretches with instruction to keep head in neutral    Consulted and Agree with Plan of Care Patient      Patient will benefit from skilled therapeutic intervention in order to improve the following deficits and impairments:  Difficulty walking, Pain, Impaired flexibility, Increased edema, Decreased strength, Decreased mobility, Decreased activity tolerance, Decreased safety awareness, Increased muscle spasms, Decreased endurance  Visit Diagnosis: Other muscle spasm  Muscle weakness (generalized)  Chronic bilateral low back pain with left-sided sciatica  Muscle spasm of back  Difficulty in walking, not elsewhere classified     Problem List Patient Active Problem List   Diagnosis Date Noted  . Neck pain 12/04/2015  . Carotid stenosis 11/07/2015  . Left-sided carotid artery disease ([place]) 10/01/2015  . Precordial pain   . DOE (dyspnea on exertion)   . Screening for STD (sexually transmitted disease) 04/19/2015  . Contact dermatitis 02/14/2015  . Health care maintenance 03/08/2014  . Family history of prostate cancer 03/08/2014  . Screening for prostate cancer  03/08/2014  . Tachycardia 03/07/2014  . Open-angle glaucoma 05/11/2013  . Gallbladder polyp 03/13/2013  . Preventive medication therapy needed 12/05/2012  . Dyshidrotic eczema 07/08/2012  . Hypertension 02/12/2012  . Hepatitis C 02/12/2012  . Carpal tunnel syndrome, bilateral 02/17/2011  . Chronic back pain 09/15/2010  . Diabetes mellitus without complication ([place]) [id number]  . Hyperlipidemia 07/01/2006  . TOBACCO DEPENDENCE 07/01/2006    Carney Living PT DPT  05/13/2016, 5:09 PM  [facility] [address] [place], [place], [address] Phone: [phone]   Fax:  [phone]  Name: Wyatt Sandoval MRN: [id number] Date of Birth: Jul 15, 1960

## 2016-05-18 ENCOUNTER — Ambulatory Visit: Payer: Medicare Other | Admitting: Physical Therapy

## 2016-05-18 ENCOUNTER — Encounter: Payer: Self-pay | Admitting: Physical Therapy

## 2016-05-18 DIAGNOSIS — M62838 Other muscle spasm: Secondary | ICD-10-CM

## 2016-05-18 DIAGNOSIS — M6281 Muscle weakness (generalized): Secondary | ICD-10-CM

## 2016-05-18 DIAGNOSIS — M5442 Lumbago with sciatica, left side: Secondary | ICD-10-CM | POA: Diagnosis not present

## 2016-05-18 DIAGNOSIS — M62830 Muscle spasm of back: Secondary | ICD-10-CM

## 2016-05-18 DIAGNOSIS — G8929 Other chronic pain: Secondary | ICD-10-CM
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
## 2016-05-20 ENCOUNTER — Ambulatory Visit: Payer: Medicare Other | Admitting: Physical Therapy

## 2016-05-28 ENCOUNTER — Ambulatory Visit: Payer: Medicare Other | Admitting: Physical Therapy

## 2016-06-11 IMAGING — CT CT ANGIO NECK
3 of 11 series · 11 of 46 positions shown, 18 images · IV contrast (isovue)
Comparison: Go to contrast

CLINICAL DATA: Left carotid artery stenosis.

EXAM:
CT ANGIOGRAPHY NECK
TECHNIQUE: Multidetector CT imaging of the neck was performed using the
standard protocol during bolus administration of intravenous
contrast. Multiplanar CT image reconstructions and MIPs were
obtained to evaluate the vascular anatomy. Carotid stenosis
measurements (when applicable) are obtained utilizing NASCET
criteria, using the distal internal carotid diameter as the
denominator.
CONTRAST:  80 mL Isovue 370

[Series 7: cta neck 2.0 i30f 3 · axial · 0.44mm/px · z∈[-268,-76]mm · 7 of 128 slices shown, 12 images]
[im 16/128  soft-tissue]
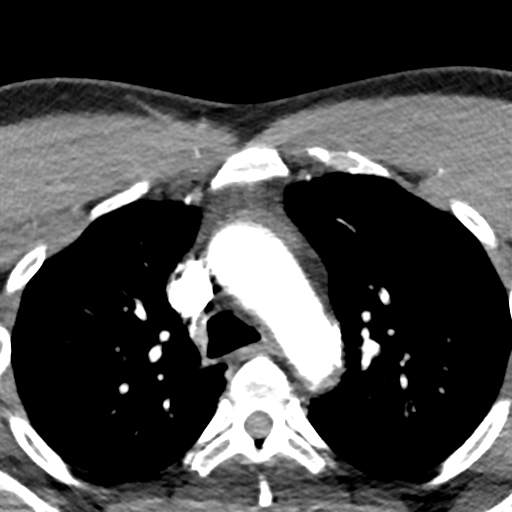
[im 16/128  bone]
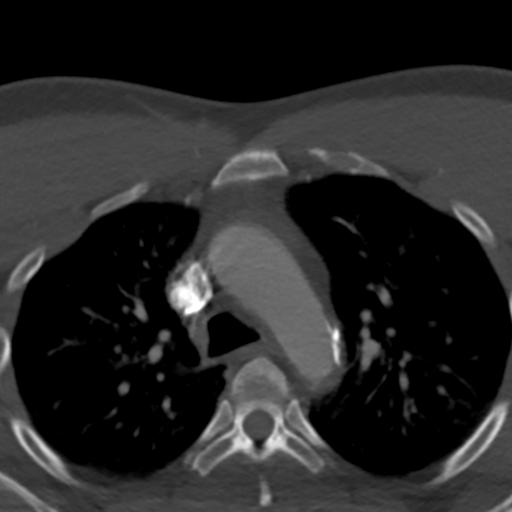
[im 32/128  soft-tissue]
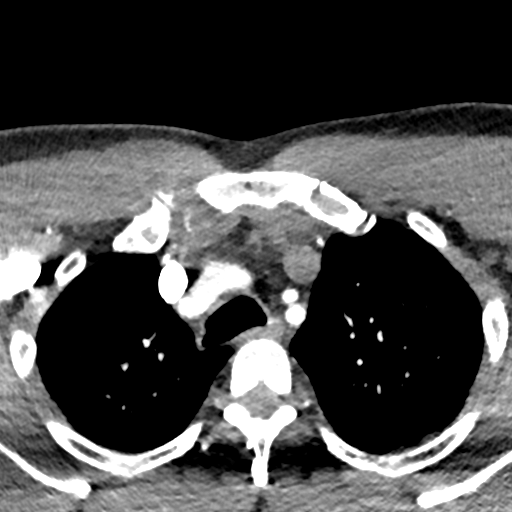
[im 48/128  soft-tissue]
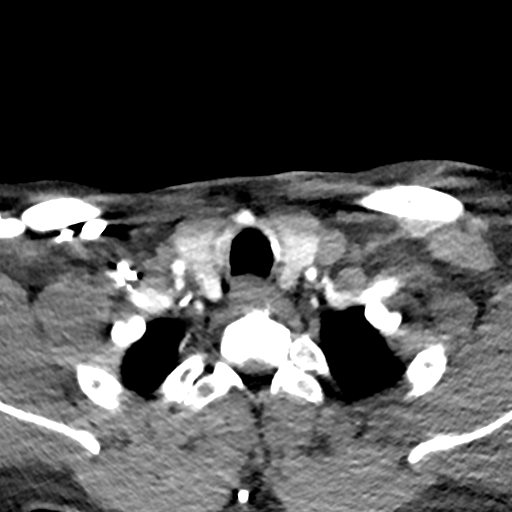
[im 64/128  soft-tissue]
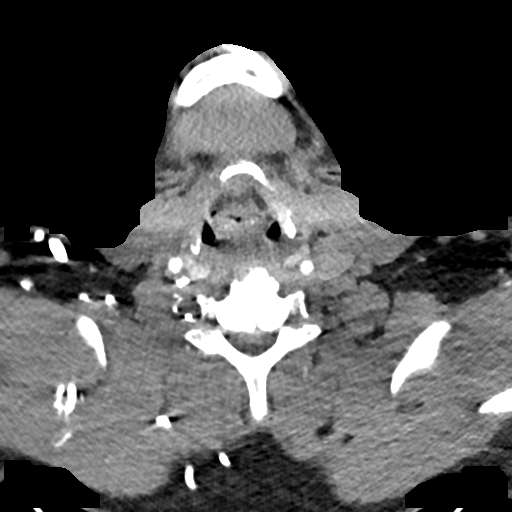
[im 64/128  lung]
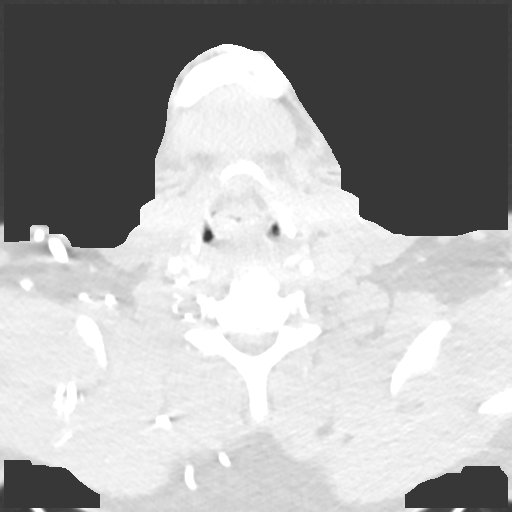
[im 80/128  soft-tissue]
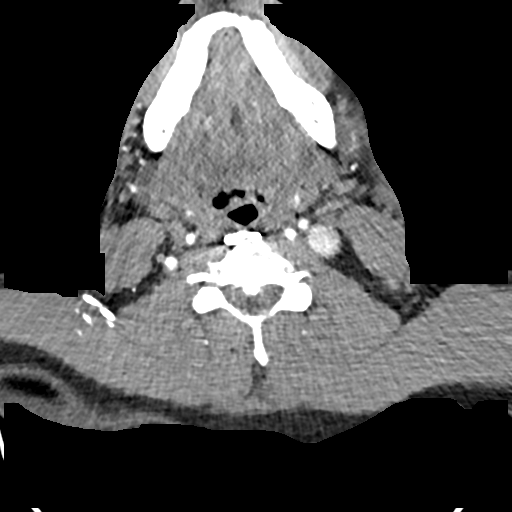
[im 80/128  lung]
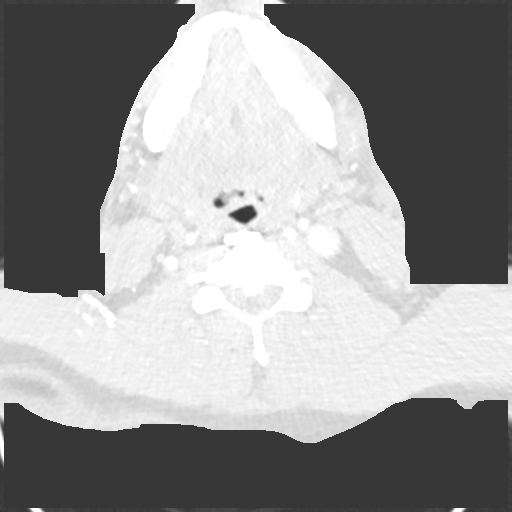
[im 96/128  soft-tissue]
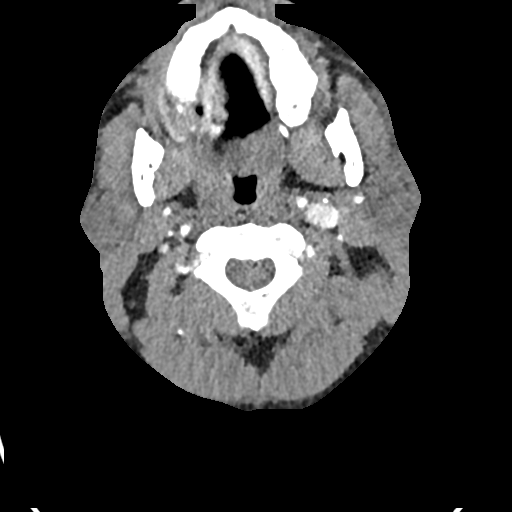
[im 96/128  lung]
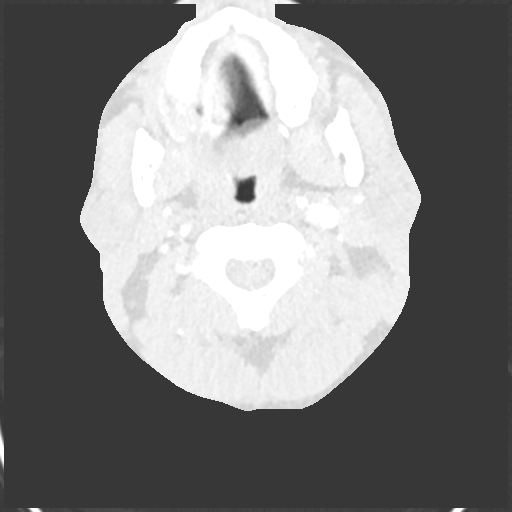
[im 112/128  soft-tissue]
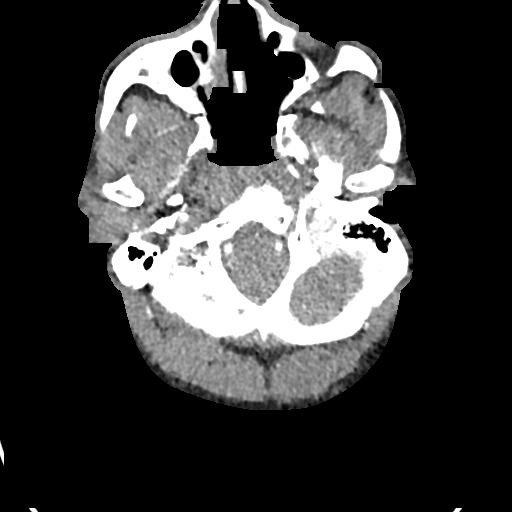
[im 112/128  lung]
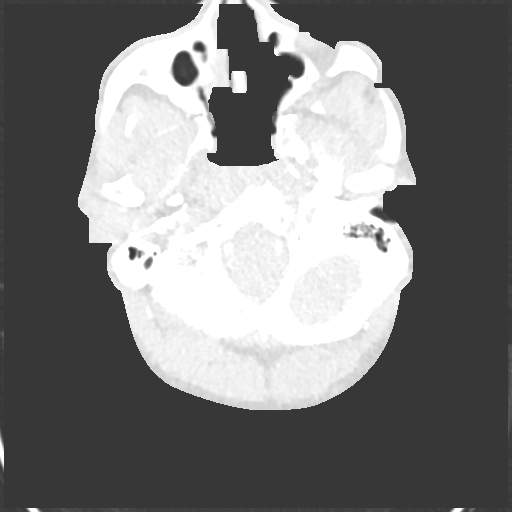

[Series 10: coronal thin · coronal · 0.59mm/px · 3 of 227 slices shown, 4 images]
[im 65/227  soft-tissue]
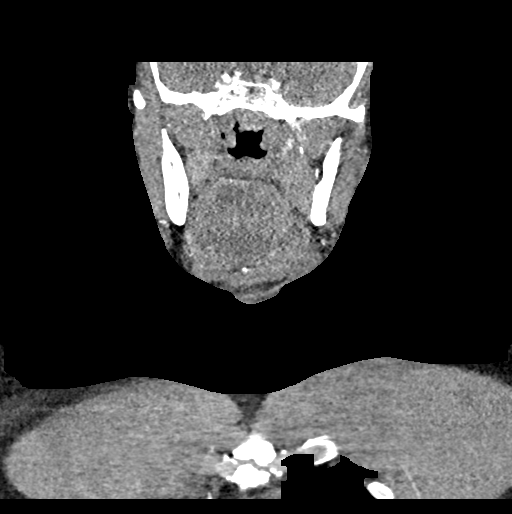
[im 97/227  soft-tissue]
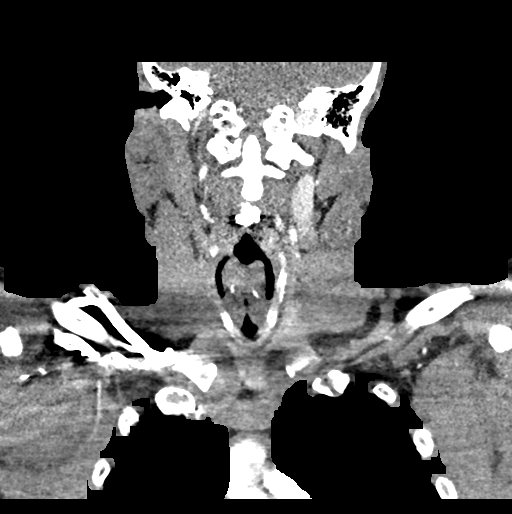
[im 97/227  bone]
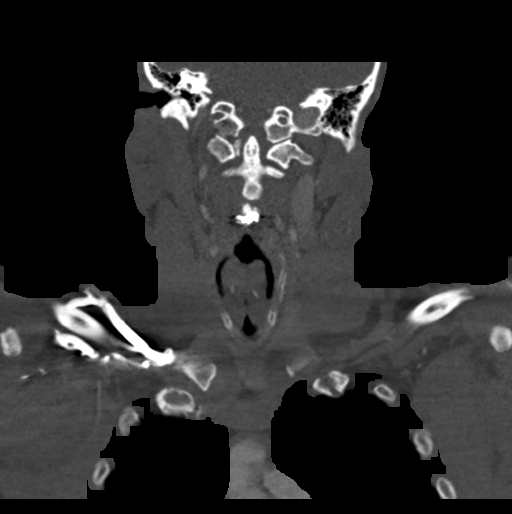
[im 130/227  soft-tissue]
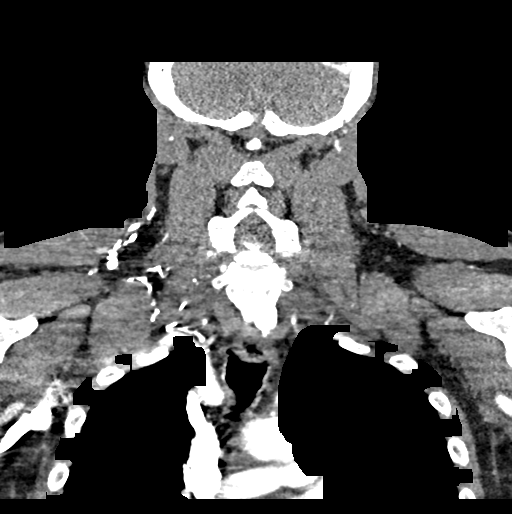

[Series 12: sagittal thin · sagittal · 0.52mm/px · 1 of 301 slices shown, 2 images]
[im 151/301  soft-tissue]
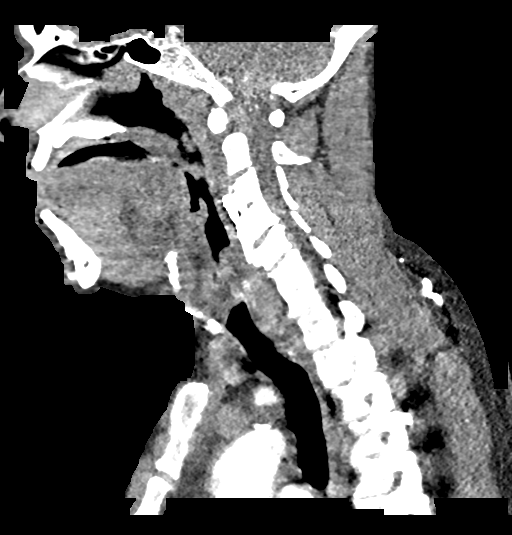
[im 151/301  bone]
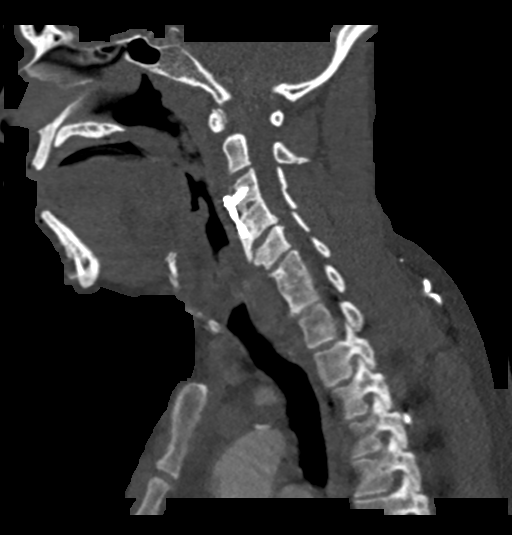

[11 of 46 positions shown; findings below may reference images not displayed]

FINDINGS: Aortic arch: A 3 vessel arch configuration is present. Mild
narrowing of just less than 50% is present in the innominate artery
proximal to the bifurcation. Calcifications are present at the
aortic arch without significant stenosis.

Right carotid system: Atherosclerotic changes are present in the
distal right common carotid artery. Dense calcifications are present
at the bifurcation. The lumen is narrowed to 1.1 mm. The more distal
vessel measures 3.7 mm. No tandem stenoses are present.

Left carotid system: Mild atherosclerotic changes are present in the
distal left common carotid artery. Dense calcifications are present
at carotid bifurcation with a high-grade, near occlusive, stenosis.
The more distal left ICA is narrowed at 4.2 mm. No tandem stenoses
are present.

Vertebral arteries:Atherosclerotic changes are present in the
proximal vertebral arteries bilaterally without a significant
stenosis relative to the more distal vessel. The right vertebral
artery is the dominant vessel. Tortuosity is present at the C2 level
without significant stenosis. The vertebrobasilar junction is
intact.

Skeleton: Anterior fusion is noted at C3-4 and C6-7. Endplate
changes and disc disease are noted at both C4-5 and C5-6. Vertebral
body heights are maintained. No focal lytic or blastic lesions are
present. The patient is edentulous.

Other neck: No focal mucosal or submucosal lesions are present.
Vocal cords are midline and symmetric. There is no significant
adenopathy. Salivary glands are within normal limits. The thyroid is
unremarkable.
IMPRESSION: 1. High-grade, near occlusive stenosis, of the left internal carotid
artery at the bifurcation.
2. Moderate stenosis of the right internal carotid artery relative
to the more distal vessel.
3. Atherosclerotic changes in the proximal vertebral arteries
bilaterally without significant stenosis.
4. Cervical spine fusion at C3-4 and C6-7 with disc disease noted at
C4-5 and C5-6.

## 2016-07-11 ENCOUNTER — Other Ambulatory Visit: Payer: Self-pay | Admitting: Family Medicine

## 2016-07-13 ENCOUNTER — Encounter: Payer: Self-pay | Admitting: Surgery

## 2016-07-23 ENCOUNTER — Other Ambulatory Visit: Payer: Self-pay | Admitting: Family Medicine

## 2016-07-27 ENCOUNTER — Encounter: Payer: Self-pay | Admitting: Surgery

## 2016-07-27 ENCOUNTER — Ambulatory Visit (HOSPITAL_COMMUNITY)
Admission: RE | Admit: 2016-07-27 | Discharge: 2016-07-27 | Disposition: A | Discharge status: Home / Self Care | Payer: Medicare Other | Source: Ambulatory Visit | Attending: Surgery | Admitting: Surgery

## 2016-07-27 ENCOUNTER — Ambulatory Visit (INDEPENDENT_AMBULATORY_CARE_PROVIDER_SITE_OTHER): Payer: Medicare Other | Admitting: Surgery

## 2016-07-27 VITALS — BP 105/70 | HR 77 | Temp 97.4°F | Resp 21 | Ht 67.0 in | Wt 188.9 lb

## 2016-07-27 DIAGNOSIS — I6523 Occlusion and stenosis of bilateral carotid arteries: Secondary | ICD-10-CM

## 2016-07-27 DIAGNOSIS — I6522 Occlusion and stenosis of left carotid artery: Secondary | ICD-10-CM | POA: Diagnosis not present

## 2016-07-27 LAB — VAS US CAROTID
LCCADDIAS: 28 cm/s
LCCAPDIAS: 29 cm/s
LCCAPSYS: 117 cm/s
LEFT ECA DIAS: -21 cm/s
Left CCA dist sys: 94 cm/s
Left ICA dist dias: -11 cm/s
Left ICA dist sys: -68 cm/s
Left ICA prox dias: -18 cm/s
Left ICA prox sys: -121 cm/s
RCCADSYS: -83 cm/s
RCCAPDIAS: 20 cm/s
RIGHT CCA MID DIAS: 18 cm/s
RIGHT ECA DIAS: -18 cm/s
Right CCA prox sys: 118 cm/s
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
## 2016-07-29 NOTE — Addendum Note (Signed)
Addended by: Lianne Cure A on: 07/29/2016 03:29 PM   Modules accepted: Orders

## 2016-08-05 ENCOUNTER — Other Ambulatory Visit: Payer: Self-pay | Admitting: Family Medicine

## 2016-08-24 ENCOUNTER — Ambulatory Visit (INDEPENDENT_AMBULATORY_CARE_PROVIDER_SITE_OTHER): Payer: Medicare Other | Admitting: Family Medicine

## 2016-08-24 ENCOUNTER — Encounter: Payer: Self-pay | Admitting: Family Medicine

## 2016-08-24 DIAGNOSIS — I6523 Occlusion and stenosis of bilateral carotid arteries: Secondary | ICD-10-CM | POA: Diagnosis present

## 2016-08-24 DIAGNOSIS — R21 Rash and other nonspecific skin eruption: Secondary | ICD-10-CM

## 2016-08-24 NOTE — Assessment & Plan Note (Signed)
Patient is here for a suspected infected insect bite. Differential also includes inflamed sebaceous cyst. Patient states that this site has hardly drained. No evidence of fluctuance or induration on exam. Seems to be healing well. - Provided some bacitracin ointment. I have asked that he apply this 2 times daily for the next 3 days. - Otherwise patient is to keep this site clean and dry. - Return precautions discussed

## 2016-08-24 NOTE — Progress Notes (Signed)
   HPI  CC: RASH "insect bite". First noticed last week. Started itching. Had some puss. Seems to be getting worse.   Had rash for ~7 days. Location: upper mid back Medications tried: neosporin Similar rash in past: no New medications or antibiotics: no Tick, Insect or new pet exposure: no Recent travel: no New detergent or soap: no Immunocompromised: no  Symptoms Itching: yes Pain over rash: yes Feeling ill all over: no Fever: no Mouth sores: no Face or tongue swelling: no Trouble breathing: no Joint swelling or pain: no  Review of Symptoms - see HPI PMH - Smoking status noted.    Objective: BP 128/82   Pulse 91   Temp 98 F (36.7 C) (Oral)   Wt 189 lb (85.7 kg)   BMI 29.60 kg/m  Gen: NAD, alert, cooperative. CV: Well-perfused. Resp: Non-labored. Neuro: Sensation intact throughout. Integument: 2 x 2 centimeter tender lesion noted just lateral to the spinous process of T2. Lesion is erythematous with a scab present. Some surrounding erythema present. No active drainage at this time. Appears to be well healing at this time. No induration or fluctuance appreciated.   Assessment and plan:  Rash and nonspecific skin eruption Patient is here for a suspected infected insect bite. Differential also includes inflamed sebaceous cyst. Patient states that this site has hardly drained. No evidence of fluctuance or induration on exam. Seems to be healing well. - Provided some bacitracin ointment. I have asked that he apply this 2 times daily for the next 3 days. - Otherwise patient is to keep this site clean and dry. - Return precautions discussed   Elberta Leatherwood, MD,MS,  PGY3 08/24/2016 5:38 PM

## 2016-08-28 ENCOUNTER — Other Ambulatory Visit: Payer: Self-pay | Admitting: Family Medicine

## 2016-08-28 DIAGNOSIS — E119 Type 2 diabetes mellitus without complications: Secondary | ICD-10-CM

## 2016-09-29 ENCOUNTER — Other Ambulatory Visit: Payer: Self-pay | Admitting: Family Medicine

## 2016-09-29 DIAGNOSIS — E785 Hyperlipidemia, unspecified: Secondary | ICD-10-CM

## 2016-11-02 ENCOUNTER — Other Ambulatory Visit: Payer: Self-pay | Admitting: Family Medicine

## 2016-11-23 ENCOUNTER — Encounter: Payer: Self-pay | Admitting: Family Medicine

## 2016-11-23 ENCOUNTER — Ambulatory Visit (INDEPENDENT_AMBULATORY_CARE_PROVIDER_SITE_OTHER): Payer: Medicare Other | Admitting: Family Medicine

## 2016-11-23 VITALS — BP 120/68 | HR 69 | Temp 98.0°F | Wt 187.0 lb

## 2016-11-23 DIAGNOSIS — I6523 Occlusion and stenosis of bilateral carotid arteries: Secondary | ICD-10-CM

## 2016-11-23 DIAGNOSIS — L989 Disorder of the skin and subcutaneous tissue, unspecified: Secondary | ICD-10-CM | POA: Diagnosis not present

## 2016-11-23 DIAGNOSIS — R739 Hyperglycemia, unspecified: Secondary | ICD-10-CM

## 2016-11-23 DIAGNOSIS — E785 Hyperlipidemia, unspecified: Secondary | ICD-10-CM | POA: Diagnosis not present

## 2016-11-23 DIAGNOSIS — Z13220 Encounter for screening for lipoid disorders: Secondary | ICD-10-CM

## 2016-11-23 DIAGNOSIS — W57XXXA Bitten or stung by nonvenomous insect and other nonvenomous arthropods, initial encounter: Secondary | ICD-10-CM

## 2016-11-23 HISTORY — DX: Disorder of the skin and subcutaneous tissue, unspecified: L98.9

## 2016-11-23 MED ORDER — NICOTINE 21 MG/24HR TD PT24: 21.0000 mg | MEDICATED_PATCH | Freq: Every day | TRANSDERMAL | 0 refills | Status: DC

## 2016-11-23 MED ORDER — TRIAMCINOLONE ACETONIDE 0.5 % EX CREA: 1.0000 "application " | TOPICAL_CREAM | Freq: Two times a day (BID) | CUTANEOUS | 0 refills | Status: DC

## 2016-11-23 NOTE — Patient Instructions (Addendum)
Wyatt Sandoval you were seen today for skin lesion on your arm.  This is not infected at the moment and fortunately does not need any medication or drainage.  This should go away on it's own.  If you develop fevers, chills, or drainage from the bump please come back to the office for evaluation.   I refilled your kenalog cream for eczema and also prescribed nicotine patch to help you quit smoking.   Very good to see you today. Follow up in four weeks and we can prescribe the next patch.   Nicotine skin patches What is this medicine? NICOTINE ([place]) helps people stop smoking. The patches replace the nicotine found in cigarettes and help to decrease withdrawal effects. They are most effective when used in combination with a stop-smoking program. This medicine may be used for other purposes; ask your health care provider or pharmacist if you have questions. COMMON BRAND NAME(S): Habitrol, Nicoderm CQ, Nicotrol What should I tell my health care provider before I take this medicine? They need to know if you have any of these conditions: -diabetes -heart disease, angina, irregular heartbeat or previous heart attack -high blood pressure -lung disease, including asthma -overactive thyroid -pheochromocytoma -seizures or a history of seizures -skin problems, like eczema -stomach problems or ulcers -an unusual or allergic reaction to nicotine, adhesives, other medicines, foods, dyes, or preservatives -pregnant or trying to get pregnant -breast-feeding How should I use this medicine? This medicine is for use on the skin. Follow the directions that come with the patches. Find an area of skin on your upper arm, chest, or back that is clean, dry, greaseless, undamaged and hairless. Wash hands with plain soap and water. Do not use anything that contains aloe, lanolin or glycerin as these may prevent the patch from sticking. Dry thoroughly. Remove the patch from the sealed pouch. Do not try to cut or trim  the patch. Using your palm, press the patch firmly in place for 10 seconds to make sure that there is good contact with your skin. After applying the patch, wash your hands. Change the patch every day, keeping to a regular schedule. When you apply a new patch, use a new area of skin. Wait at least 1 week before using the same area again. Talk to your pediatrician regarding the use of this medicine in children. Special care may be needed. Overdosage: If you think you have taken too much of this medicine contact a poison control center or emergency room at once. NOTE: This medicine is only for you. Do not share this medicine with others. What if I miss a dose? If you forget to replace a patch, use it as soon as you can. Only use one patch at a time and do not leave on the skin for longer than directed. If a patch falls off, you can replace it, but keep to your schedule and remove the patch at the right time. What may interact with this medicine? -medicines for asthma -medicines for blood pressure -medicines for mental depression This list may not describe all possible interactions. Give your health care provider a list of all the medicines, herbs, non-prescription drugs, or dietary supplements you use. Also tell them if you smoke, drink alcohol, or use illegal drugs. Some items may interact with your medicine. What should I watch for while using this medicine? You should begin using the nicotine patch the day you stop smoking. It is okay if you do not succeed at your attempt to  quit and have a cigarette. You can still continue your quit attempt and keep using the product as directed. Just throw away your cigarettes and get back to your quit plan. You can keep the patch in place during swimming, bathing, and showering. If your patch falls off during these activities, replace it. When you first apply the patch, your skin may itch or burn. This should go away soon. When you remove a patch, the skin may look  red, but this should only last for a few days. Call your doctor or health care professional if skin redness does not go away after 4 days, if your skin swells, or if you get a rash. If you are a diabetic and you quit smoking, the effects of insulin may be increased and you may need to reduce your insulin dose. Check with your doctor or health care professional about how you should adjust your insulin dose. If you are going to have a magnetic resonance imaging (MRI) procedure, tell your MRI technician if you have this patch on your body. It must be removed before a MRI. What side effects may I notice from receiving this medicine? Side effects that you should report to your doctor or health care professional as soon as possible: -allergic reactions like skin rash, itching or hives, swelling of the face, lips, or tongue -breathing problems -changes in hearing -changes in vision -chest pain -cold sweats -confusion -fast, irregular heartbeat -feeling faint or lightheaded, falls -headache -increased saliva -skin redness that lasts more than 4 days -stomach pain -signs and symptoms of nicotine overdose like nausea; vomiting; dizziness; weakness; and rapid heartbeat Side effects that usually do not require medical attention (report to your doctor or health care professional if they continue or are bothersome): -diarrhea -dry mouth -hiccups -irritability -nervousness or restlessness -trouble sleeping or vivid dreams This list may not describe all possible side effects. Call your doctor for medical advice about side effects. You may report side effects to FDA at 1-800-FDA-1088. Where should I keep my medicine? Keep out of the reach of children. Store at room temperature between 20 and 25 degrees C (68 and 77 degrees F). Protect from heat and light. Store in International aid/development worker until ready to use. Throw away unused medicine after the expiration date. When you remove a patch, fold with sticky  sides together; put in an empty opened pouch and throw away. NOTE: This sheet is a summary. It may not cover all possible information. If you have questions about this medicine, talk to your doctor, pharmacist, or health care provider.  2018 Elsevier/Gold Standard (2014-03-19 15:46:21)   Quillian Quince L. Rosalyn Gess, [place] Medicine Resident PGY-2 11/23/2016 2:25 PM

## 2016-11-23 NOTE — Progress Notes (Signed)
    Subjective:  Wyatt Sandoval is a 56 y.o. male who presents to the [facility] today for swollen arm from a "bite"  HPI:  Skin lesion: - believes to have been bitten by "spider" 3-4 weeks ago - came to head during that time, popped it last week and pus came out - MRSA negative - no fevers, chills, nausea, vomiting or diarrhea  PMH: non-contributory Tobacco use: current smoker Medication: reviewed and updated ROS: see HPI   Objective:  Physical Exam: BP 120/68   Pulse 69   Temp 98 F (36.7 C) (Oral)   Wt 187 lb (84.8 kg)   BMI 29.29 kg/m   Gen: 56yo M in NAD, resting comfortably Skin:  1.5-2cm/1.5-2cm, non-tender, non-erythematous nodule on L forearm. No fluctuance or drainage  No results found for this or any previous visit (from the past 72 hour(s)).   Assessment/Plan:  Skin lesion of left arm Non-tender non-erythematous lesion that is likely to be scar tissue based on appearance and lack of infectious signs and symptoms.  - continue to watch. Return precautions discussed with patient  Health maintenance: Patient due for lipid panel, hemoglobin A1C and opthalmology exam today. -f/u lipid panel, hemoglobin A1C -recommended follow up opthalmology

## 2016-11-23 NOTE — Assessment & Plan Note (Signed)
Non-tender non-erythematous lesion that is likely to be scar tissue based on appearance and lack of infectious signs and symptoms.  - continue to watch. Return precautions discussed with patient

## 2016-11-24 LAB — LIPID PANEL
CHOLESTEROL TOTAL: 160 mg/dL (ref 100–199)
Chol/HDL Ratio: 3.2 ratio (ref 0.0–5.0)
HDL: 50 mg/dL (ref >39)
LDL Calculated: 55 mg/dL (ref 0–99)
TRIGLYCERIDES: 276 mg/dL — AB (ref 0–149)
VLDL Cholesterol Cal: 55 mg/dL — ABNORMAL HIGH (ref 5–40)

## 2016-11-24 LAB — HEMOGLOBIN A1C
Est. average glucose Bld gHb Est-mCnc: 151 mg/dL
Hgb A1c MFr Bld: 6.9 % — ABNORMAL HIGH (ref 4.8–5.6)

## 2017-01-02 ENCOUNTER — Other Ambulatory Visit: Payer: Self-pay | Admitting: Family Medicine

## 2017-01-02 DIAGNOSIS — E119 Type 2 diabetes mellitus without complications: Secondary | ICD-10-CM

## 2017-01-05 ENCOUNTER — Other Ambulatory Visit: Payer: Self-pay | Admitting: Family Medicine

## 2017-01-05 DIAGNOSIS — E785 Hyperlipidemia, unspecified: Secondary | ICD-10-CM

## 2017-02-09 ENCOUNTER — Other Ambulatory Visit: Payer: Self-pay | Admitting: *Deleted

## 2017-02-09 ENCOUNTER — Ambulatory Visit (INDEPENDENT_AMBULATORY_CARE_PROVIDER_SITE_OTHER): Payer: Medicare Other | Admitting: Family Medicine

## 2017-02-09 ENCOUNTER — Other Ambulatory Visit (HOSPITAL_COMMUNITY)
Admission: RE | Admit: 2017-02-09 | Discharge: 2017-02-09 | Disposition: A | Discharge status: Home / Self Care | Payer: Medicare Other | Source: Ambulatory Visit | Attending: Family Medicine | Admitting: Family Medicine

## 2017-02-09 ENCOUNTER — Encounter: Payer: Self-pay | Admitting: Internal Medicine

## 2017-02-09 VITALS — BP 126/82 | HR 91 | Temp 97.7°F | Wt 186.0 lb

## 2017-02-09 DIAGNOSIS — A5409 Other gonococcal infection of lower genitourinary tract: Secondary | ICD-10-CM | POA: Diagnosis not present

## 2017-02-09 DIAGNOSIS — F17200 Nicotine dependence, unspecified, uncomplicated: Secondary | ICD-10-CM | POA: Diagnosis not present

## 2017-02-09 DIAGNOSIS — Z23 Encounter for immunization: Secondary | ICD-10-CM | POA: Diagnosis not present

## 2017-02-09 DIAGNOSIS — Z114 Encounter for screening for human immunodeficiency virus [HIV]: Secondary | ICD-10-CM | POA: Diagnosis not present

## 2017-02-09 DIAGNOSIS — Z202 Contact with and (suspected) exposure to infections with a predominantly sexual mode of transmission: Secondary | ICD-10-CM

## 2017-02-09 DIAGNOSIS — R360 Urethral discharge without blood: Secondary | ICD-10-CM | POA: Insufficient documentation

## 2017-02-09 DIAGNOSIS — R369 Urethral discharge, unspecified: Secondary | ICD-10-CM | POA: Diagnosis present

## 2017-02-09 DIAGNOSIS — Z20828 Contact with and (suspected) exposure to other viral communicable diseases: Secondary | ICD-10-CM

## 2017-02-09 MED ORDER — AZITHROMYCIN 500 MG PO TABS
1000.0000 mg | ORAL_TABLET | Freq: Once | ORAL | Status: AC
  Administered 2017-02-09: 1000 mg via ORAL

## 2017-02-09 MED ORDER — VARENICLINE TARTRATE 0.5 MG PO TABS: ORAL_TABLET | ORAL | 2 refills | Status: DC

## 2017-02-09 MED ORDER — AMITRIPTYLINE HCL 150 MG PO TABS: 150.0000 mg | ORAL_TABLET | Freq: Every day | ORAL | 0 refills | Status: DC

## 2017-02-09 MED ORDER — CEFTRIAXONE SODIUM 250 MG IJ SOLR
250.0000 mg | Freq: Once | INTRAMUSCULAR | Status: AC
  Administered 2017-02-09: 250 mg via INTRAMUSCULAR

## 2017-02-09 NOTE — Assessment & Plan Note (Signed)
Patient reqeusted refill of chantix

## 2017-02-09 NOTE — Progress Notes (Signed)
    Subjective:  Wyatt Sandoval is a 56 y.o. male who presents to the [facility] today with a chief complaint of penile discharge.   Notes some discomfort while urinating and no lesions on penis.    Some purulence can be expressed by manipulation of penis, it is white/thick with no sign of blood.   Symptoms start early this week.   He has had two sexual partners in the last month and cannot be positive of their sexual activity.  This is the first time he has symptoms like this and does not know if his partners have had symptoms.  He does not use barrier protection.   Some associated lower abdominal discomfort when he urinates  But no fever symptoms, N/V or any other symptoms claimed.   Objective:  Physical Exam: BP 126/82   Pulse 91   Temp 97.7 F (36.5 C) (Oral)   Wt 186 lb (84.4 kg)   SpO2 96%   BMI 29.13 kg/m   Gen: NAD, resting comfortably CV: RRR with no murmurs appreciated Pulm: NWOB, CTAB with no crackles, wheezes, or rhonchi GI: Normal bowel sounds present. Soft, Nontender, Nondistended. MSK: no edema, cyanosis, or clubbing noted Skin: warm, dry GU: no lesions or swelling noted on visual exam, there was evident purulence under foreskin and draining from urethra on manipulation of gentials Neuro: grossly normal, moves all extremities Psych: Normal affect and thought content  No results found for this or any previous visit (from the past 72 hour(s)).   Assessment/Plan:  Penile discharge, without blood G/C swab pulled, patient had purulent white discharge with no blood and no noted lesion on genitals.  Patient advised to get further STI workup including HIV and refused despite warnings about transmission risks to others and health risks to himself.  He will be treated with 250mg  CTX and 1GM Azithrmyocin and contact with results.   Patient consents to results being left on his answering machine  TOBACCO DEPENDENCE Patient reqeusted refill of chantix  Patient did consent to blood  draw after initial refusal for a more complete STI workup.   Patient will be contact with results  Sherene Sires, [place] - PGY1 02/09/2017 3:53 PM

## 2017-02-09 NOTE — Assessment & Plan Note (Signed)
G/C swab pulled, patient had purulent white discharge with no blood and no noted lesion on genitals.  Patient advised to get further STI workup including HIV and refused despite warnings about transmission risks to others and health risks to himself.  He will be treated with 250mg  CTX and 1GM Azithrmyocin and contact with results.   Patient consents to results being left on his answering machine

## 2017-02-09 NOTE — Progress Notes (Signed)
Encounter opened in error

## 2017-02-09 NOTE — Patient Instructions (Addendum)
It was a pleasure to see you today! Thank you for choosing [facility] for your primary care. Wyatt Sandoval was seen for penile discharge. Come back to the clinic if symptoms do not imporve in 1 wk or get worse, and go to the emergency room if you develop any life threatening symptoms.  You are being treated today empirically but your results my not be back for your gonnorhea/chlamdidya test for a few days.   You are advised to get a blood workup for other STIs including HIV but we know that you have declined at this time.   We think it is important that you stay abstinent until your treatment resolves and inform all recent partners so they can get treated too.   Declining blood tests for other diseases leaves both you and your partners at a higher risk.   We have also refilled your chantix.  If we did any lab work today, and the results require attention, either me or my nurse will get in touch with you. If everything is normal, you will get a letter in mail and a message via . If you don't hear from [place] in two weeks, please give [place] a call. Otherwise, we look forward to seeing you again at your next visit. If you have any questions or concerns before then, please call the clinic at [phone].  Please bring all your medications to every doctors visit  Sign up for My Chart to have easy access to your labs results, and communication with your Primary care physician.    Please check-out at the front desk before leaving the clinic.    Best,  Dr. Sherene Sires FAMILY MEDICINE RESIDENT - PGY1 02/09/2017 3:53 PM

## 2017-02-10 ENCOUNTER — Telehealth: Payer: Self-pay | Admitting: *Deleted

## 2017-02-10 LAB — HIV ANTIBODY (ROUTINE TESTING W REFLEX): HIV SCREEN 4TH GENERATION: NONREACTIVE

## 2017-02-10 LAB — GC/CHLAMYDIA PROBE AMP (~~LOC~~) NOT AT ARMC
CHLAMYDIA, DNA PROBE: NEGATIVE
Neisseria Gonorrhea: POSITIVE — AB

## 2017-02-10 LAB — RPR: RPR Ser Ql: NONREACTIVE

## 2017-02-10 NOTE — Telephone Encounter (Signed)
Prior Authorization received from [place] for chantix 0.5 mg.  PA form placed in provider box for completion. Derl Barrow, RN

## 2017-04-19 ENCOUNTER — Other Ambulatory Visit: Payer: Self-pay | Admitting: Family Medicine

## 2017-04-19 DIAGNOSIS — E119 Type 2 diabetes mellitus without complications: Secondary | ICD-10-CM

## 2017-04-19 NOTE — Telephone Encounter (Signed)
Needs refills on metformin, trazodone, and enapril and triamcinolone cream.    [facility] on [address]

## 2017-04-20 MED ORDER — ENALAPRIL MALEATE 5 MG PO TABS: 5.0000 mg | ORAL_TABLET | Freq: Every day | ORAL | 3 refills | Status: DC

## 2017-04-20 MED ORDER — TRAZODONE HCL 50 MG PO TABS: ORAL_TABLET | ORAL | 0 refills | Status: DC

## 2017-04-20 MED ORDER — METFORMIN HCL 1000 MG PO TABS: ORAL_TABLET | ORAL | 0 refills | Status: DC

## 2017-05-12 ENCOUNTER — Other Ambulatory Visit: Payer: Self-pay | Admitting: Family Medicine

## 2017-05-12 DIAGNOSIS — E785 Hyperlipidemia, unspecified: Secondary | ICD-10-CM

## 2017-05-12 DIAGNOSIS — K219 Gastro-esophageal reflux disease without esophagitis: Secondary | ICD-10-CM

## 2017-05-12 MED ORDER — SIMVASTATIN 40 MG PO TABS: 40.0000 mg | ORAL_TABLET | Freq: Every evening | ORAL | 0 refills | Status: DC

## 2017-05-12 MED ORDER — TRIAMCINOLONE ACETONIDE 0.5 % EX CREA: 1.0000 "application " | TOPICAL_CREAM | Freq: Two times a day (BID) | CUTANEOUS | 0 refills | Status: AC

## 2017-05-12 MED ORDER — AMITRIPTYLINE HCL 150 MG PO TABS: 150.0000 mg | ORAL_TABLET | Freq: Every day | ORAL | 0 refills | Status: DC

## 2017-05-20 ENCOUNTER — Other Ambulatory Visit: Payer: Self-pay | Admitting: Family Medicine

## 2017-06-14 ENCOUNTER — Other Ambulatory Visit: Payer: Self-pay | Admitting: Family Medicine

## 2017-06-14 DIAGNOSIS — I10 Essential (primary) hypertension: Secondary | ICD-10-CM

## 2017-06-18 ENCOUNTER — Other Ambulatory Visit: Payer: Self-pay | Admitting: Family Medicine

## 2017-07-26 ENCOUNTER — Other Ambulatory Visit: Payer: Self-pay | Admitting: Family Medicine

## 2017-07-26 ENCOUNTER — Other Ambulatory Visit: Payer: Self-pay

## 2017-07-26 DIAGNOSIS — E119 Type 2 diabetes mellitus without complications: Secondary | ICD-10-CM

## 2017-07-26 MED ORDER — TRAZODONE HCL 50 MG PO TABS: ORAL_TABLET | ORAL | 0 refills | Status: DC

## 2017-07-26 MED ORDER — METFORMIN HCL 1000 MG PO TABS: ORAL_TABLET | ORAL | 0 refills | Status: DC

## 2017-07-26 NOTE — Telephone Encounter (Signed)
Pt needs refill on metformin and trazadone.  He tried to make an appt (he needs morning appt.). There are only afternoon appts in April and no schedule yet for may.  Please advise

## 2017-08-02 ENCOUNTER — Encounter: Payer: Self-pay | Admitting: Family

## 2017-08-02 ENCOUNTER — Ambulatory Visit (HOSPITAL_COMMUNITY)
Admission: RE | Admit: 2017-08-02 | Discharge: 2017-08-02 | Disposition: A | Discharge status: Home / Self Care | Payer: Medicare Other | Source: Ambulatory Visit | Attending: Surgery | Admitting: Surgery

## 2017-08-02 ENCOUNTER — Telehealth: Payer: Self-pay

## 2017-08-02 ENCOUNTER — Ambulatory Visit (INDEPENDENT_AMBULATORY_CARE_PROVIDER_SITE_OTHER): Payer: Medicare Other | Admitting: Family

## 2017-08-02 VITALS — BP 93/64 | HR 72 | Temp 97.1°F | Resp 18 | Ht 67.0 in | Wt 186.3 lb

## 2017-08-02 DIAGNOSIS — Z87891 Personal history of nicotine dependence: Secondary | ICD-10-CM | POA: Diagnosis not present

## 2017-08-02 DIAGNOSIS — I6522 Occlusion and stenosis of left carotid artery: Secondary | ICD-10-CM | POA: Diagnosis not present

## 2017-08-02 DIAGNOSIS — Z9889 Other specified postprocedural states: Secondary | ICD-10-CM | POA: Diagnosis not present

## 2017-08-02 MED ORDER — TRAZODONE HCL 50 MG PO TABS: 50.0000 mg | ORAL_TABLET | Freq: Every day | ORAL | 0 refills | Status: DC

## 2017-08-02 MED ORDER — METFORMIN HCL 1000 MG PO TABS: ORAL_TABLET | ORAL | 0 refills | Status: DC
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
## 2017-08-02 NOTE — Telephone Encounter (Signed)
Created in error

## 2017-08-02 NOTE — Patient Instructions (Signed)

## 2017-08-16 ENCOUNTER — Encounter: Payer: Self-pay | Admitting: Family Medicine

## 2017-08-16 ENCOUNTER — Ambulatory Visit (INDEPENDENT_AMBULATORY_CARE_PROVIDER_SITE_OTHER): Payer: Medicare Other | Admitting: Family Medicine

## 2017-08-16 ENCOUNTER — Ambulatory Visit (HOSPITAL_COMMUNITY)
Admission: RE | Admit: 2017-08-16 | Discharge: 2017-08-16 | Disposition: A | Discharge status: Home / Self Care | Payer: Medicare Other | Source: Ambulatory Visit | Attending: Family Medicine | Admitting: Family Medicine

## 2017-08-16 ENCOUNTER — Other Ambulatory Visit: Payer: Self-pay

## 2017-08-16 VITALS — BP 142/78 | HR 92 | Temp 98.4°F | Ht 67.0 in | Wt 191.8 lb

## 2017-08-16 DIAGNOSIS — R000 Tachycardia, unspecified: Secondary | ICD-10-CM | POA: Diagnosis not present

## 2017-08-16 DIAGNOSIS — I10 Essential (primary) hypertension: Secondary | ICD-10-CM | POA: Diagnosis not present

## 2017-08-16 DIAGNOSIS — R42 Dizziness and giddiness: Secondary | ICD-10-CM | POA: Insufficient documentation

## 2017-08-16 DIAGNOSIS — K219 Gastro-esophageal reflux disease without esophagitis: Secondary | ICD-10-CM

## 2017-08-16 DIAGNOSIS — I6522 Occlusion and stenosis of left carotid artery: Secondary | ICD-10-CM

## 2017-08-16 DIAGNOSIS — F410 Panic disorder [episodic paroxysmal anxiety] without agoraphobia: Secondary | ICD-10-CM | POA: Diagnosis not present

## 2017-08-16 DIAGNOSIS — E785 Hyperlipidemia, unspecified: Secondary | ICD-10-CM

## 2017-08-16 MED ORDER — OMEPRAZOLE 40 MG PO CPDR: 40.0000 mg | DELAYED_RELEASE_CAPSULE | Freq: Every day | ORAL | 0 refills | Status: DC

## 2017-08-16 MED ORDER — AMITRIPTYLINE HCL 150 MG PO TABS: 150.0000 mg | ORAL_TABLET | Freq: Every day | ORAL | 0 refills | Status: DC

## 2017-08-16 MED ORDER — SIMVASTATIN 40 MG PO TABS: 40.0000 mg | ORAL_TABLET | Freq: Every evening | ORAL | 0 refills | Status: DC

## 2017-08-16 NOTE — Progress Notes (Signed)
Subjective:   Patient ID: Wyatt Sandoval    DOB: 1960-06-29, 57 y.o. male   MRN: [id number]  CC: panic attack, lightheadedness  HPI: Wyatt Sandoval is a 57 y.o. male who presents to clinic today for the following problems.  Panic attack Patient states on Friday as soon as he got to work he felt dizzy and experienced some shortness of breath.  He sat down in a chair for a few minutes and felt better.  Denies falls or LOC.  Has not had any recent head injury. He states he was walking when it occurred.  No CP, palpitations, leg swelling.  This has happened one other time about a week ago and resolved after several minutes.  He feels it is a panic attack.  No recent medication changes.  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  No recent stressors at work or at home. No prior h/o anxiety.  He is a diabetic but does not feel this was a hypoglycemic episode as he was not diaphoretic or jittery.  Of note, he has significant risk factors including L carotid artery stenosis and underwent endarterectomy in 2017.    ROS: Denies fever, chills, nausea, vomiting.  Denies chest pain, palpitations, leg swelling.  Social: Former smoker, quit 07/2017 Medications reviewed. Objective:   BP (!) 142/78   Pulse 92   Temp 98.4 F (36.9 C) (Oral)   Ht 5\' 7"  (1.702 m)   Wt 191 lb 12.8 oz (87 kg)   SpO2 96%   BMI 30.04 kg/m  Vitals and nursing note reviewed.  General: Pleasant 56 year old male, NAD, appears comfortable HEENT: NCAT, EOMI, PERRLA, MMM, oropharynx clear Neck: Supple, no JVD, nontender CV: RRR no MRG, 2+ pedal pulses Lungs: CTA B, normal effort Abdomen: soft, NTND, +bs  Skin: warm, dry, no rash Extremities: warm and well perfused, no lower extremity edema Psych: Normal mood and affect, does not appear anxious  Assessment & Plan:   Dizziness Two episodes of dizziness with associated shortness of breath.  Suspect due to a panic attack, however patient denies any current stressors.  Symptoms  appear to resolve within 1-2 minutes.  No known prior history of anxiety or panic attack and he is not on any medications for this.  Denies other symptoms of chest pain, palpitations, leg swelling, or vision changes.  Could suspect underlying cardiac etiology given his significant history of L carotid artery stenosis s/p CEA in 2017. -EKG normal today without any evidence of ischemia or arrhythmia and patient currently feeling well. -TSH within normal limits, BMP and CBC without abnormalities -Referral to cardiology for further workup; -Strict return precautions discussed  Orders Placed This Encounter  Procedures  . TSH  . Basic metabolic panel  . CBC  . Ambulatory referral to Cardiology    Referral Priority:   Routine    Referral Type:   Consultation    Referral Reason:   Specialty Services Required    Requested Specialty:   Cardiology    Number of Visits Requested:   1  . EKG 12-Lead   Meds ordered this encounter  Medications  . amitriptyline (ELAVIL) 150 MG tablet    Sig: Take 1 tablet (150 mg total) by mouth at bedtime.    Dispense:  90 tablet    Refill:  0  . omeprazole (PRILOSEC) 40 MG capsule    Sig: Take 1 capsule (40 mg total) by mouth daily.    Dispense:  90 capsule    Refill:  0  . simvastatin (  ZOCOR) 40 MG tablet    Sig: Take 1 tablet (40 mg total) by mouth every evening.    Dispense:  90 tablet    Refill:  0    Lovenia Kim, MD [place], PGY-2 08/24/2017 1:55 PM

## 2017-08-16 NOTE — Patient Instructions (Signed)
It was nice meeting you today.  You were seen in clinic for dizziness and lightheadedness.  Given your medical history, it is possible this may be be related to your heart and we ordered some tests to rule this out.  I have ordered some lab work and we did an EKG in clinic today which was normal .  I will call you to let you know the results of your blood work once it returns.    Additionally, I have refilled your medications and referred you to cardiology. You can expect a call within a week or so regarding scheduling an appointment.  Please call clinic to be seen by provider if you have any new or worsening symptoms.  Be well, Lovenia Kim MD

## 2017-08-17 LAB — TSH: TSH: 2.17 u[IU]/mL (ref 0.450–4.500)

## 2017-08-17 LAB — BASIC METABOLIC PANEL
BUN/Creatinine Ratio: 8 — ABNORMAL LOW (ref 9–20)
BUN: 8 mg/dL (ref 6–24)
CO2: 25 mmol/L (ref 20–29)
CREATININE: 0.98 mg/dL (ref 0.76–1.27)
Calcium: 9.8 mg/dL (ref 8.7–10.2)
Chloride: 104 mmol/L (ref 96–106)
GFR calc Af Amer: 101 mL/min/{1.73_m2} (ref >59)
GFR calc non Af Amer: 87 mL/min/{1.73_m2} (ref >59)
GLUCOSE: 113 mg/dL — AB (ref 65–99)
Potassium: 4.5 mmol/L (ref 3.5–5.2)
Sodium: 141 mmol/L (ref 134–144)

## 2017-08-17 LAB — CBC
HEMATOCRIT: 40.3 % (ref 37.5–51.0)
Hemoglobin: 12.9 g/dL — ABNORMAL LOW (ref 13.0–17.7)
MCH: 25.6 pg — ABNORMAL LOW (ref 26.6–33.0)
MCHC: 32 g/dL (ref 31.5–35.7)
MCV: 80 fL (ref 79–97)
Platelets: 223 10*3/uL (ref 150–379)
RBC: 5.03 x10E6/uL (ref 4.14–5.80)
RDW: 15.4 % (ref 12.3–15.4)
WBC: 6.7 10*3/uL (ref 3.4–10.8)

## 2017-08-23 NOTE — Progress Notes (Signed)
Cardiology Office Note    Date:  08/24/2017   ID:  Wyatt Sandoval, DOB 03-Mar-1961, MRN [id number]  PCP:  Lenapah Bing, DO  Cardiologist:  Dr. Harrington Challenger Chief Complaint: Dizzziness  History of Present Illness:   Wyatt Sandoval is a 57 y.o. male with hx of DM, HTN, HLD and carotid artery disease ( status post left carotid endarterectomy with bovine pericardial patch angioplasty on 11/07/2015 by Dr. Trula Slade) presents for dizziness.   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 Moderate elevated LVEDP.   Carotid doppler 08/02/2017 Final Interpretation: Right Carotid: Velocities in the right ICA are consistent with a 1-39% stenosis.  Left Carotid: Velocities in the left ICA are consistent with a 40-59% stenosis. Non-hemodynamically significant plaque noted in the CCA. Progression of stenosis since previous study. Status postendarterectomy.  Vertebrals: Bilateral vertebral arteries demonstrate antegrade flow. Subclavians: Normal flow hemodynamics were seen in bilateral subclavian arteries.  Seen by PCP 08/16/17 for intermittent dizziness. EKG showed normal sinus rhythm (personally reviewed). Normal TSH, electrolytes and kidney function. HGb 12.9.   [name].  Patient has a dizziness for the past 3 to 4 months.  Occurs during standing position from sitting or laying.  Lasts for 10 minutes then gradual resolution.  It is worse after driving.  No syncope.  Currently has URI symptoms for past 3 days.  He denies chest pain.  No orthopnea, PND, melena, palpitation or blood in his stool or urine.   Past Medical History:  Diagnosis Date  . Chronic lower back pain    stemming from multiple work accidents in mid 1990s  -- has been followed by pain clinic since then  . Daily headache   . Hepatitis C   . Hyperlipidemia   . Hypertension   . Left-sided carotid artery disease ([place])   . Type II diabetes mellitus ([place])   . Wears glasses     Past Surgical  History:  Procedure Laterality Date  . ANTERIOR CERVICAL DECOMP/DISCECTOMY FUSION  2001; 2002  . BACK SURGERY    . CARDIAC CATHETERIZATION N/A 08/26/2015   Procedure: Left Heart Cath and Coronary Angiography;  Surgeon: Wyatt Booze, MD;  Location: [facility];  Service: Cardiovascular;  Laterality: N/A;  . CAROTID ENDARTERECTOMY Left 11/07/2015  . CARPAL TUNNEL RELEASE Right ~ 2015  . COLONOSCOPY    . ENDARTERECTOMY Left 11/07/2015   Procedure: LEFT CAROTID ENDARTERECTOMY ;  Surgeon: Wyatt Mitchell, MD;  Location: [facility];  Service: Vascular;  Laterality: Left;    Current Medications: Prior to Admission medications   Medication Sig Start Date End Date Taking? Authorizing Provider  amitriptyline (ELAVIL) 150 MG tablet Take 1 tablet (150 mg total) by mouth at bedtime. 08/16/17   Lovenia Kim, MD  aspirin 81 MG tablet Take 1 tablet (81 mg total) by mouth daily. 05/07/16   Eloise Levels, MD  enalapril (VASOTEC) 5 MG tablet Take 1 tablet (5 mg total) by mouth daily. 04/20/17   Eloise Levels, MD  gabapentin (NEURONTIN) 300 MG capsule Take 1 capsule (300 mg total) by mouth daily. 05/07/16   Eloise Levels, MD  LUMIGAN 0.01 % SOLN Place 1 drop in to both eyes at bedtime 05/07/16   Eloise Levels, MD  metFORMIN (GLUCOPHAGE) 1000 MG tablet TAKE 1 TABLET BY MOUTH TWICE DAILY WITH A MEAL 08/02/17   Onward Bing, DO  methocarbamol (ROBAXIN) 500 MG tablet Take 1 tablet (500 mg total) by  mouth 2 (two) times daily. 05/07/16   Eloise Levels, MD  metoprolol tartrate (LOPRESSOR) 25 MG tablet Take 1 tablet (25 mg total) by mouth 2 (two) times daily. 05/07/16   Eloise Levels, MD  metoprolol tartrate (LOPRESSOR) 25 MG tablet TAKE 1 TABLET(25 MG) BY MOUTH TWICE DAILY 06/14/17   Eloise Levels, MD  nicotine (NICODERM CQ - DOSED IN MG/24 HOURS) 21 mg/24hr patch Place 1 patch (21 mg total) onto the skin daily. 11/23/16   Eloise Levels, MD  nitroGLYCERIN (NITROSTAT) 0.4 MG SL tablet ONE TABLET  UNDER TONGUE AS NEEDED FOR CHEST PAIN EVERY 5 MINUTES. TAKE UP TO 3 DOSES FOR CHEST PAIN 05/07/16   Eloise Levels, MD  omeprazole (PRILOSEC) 40 MG capsule Take 1 capsule (40 mg total) by mouth daily. 08/16/17   Lovenia Kim, MD  oxyCODONE (ROXICODONE) 15 MG immediate release tablet Take 1 tablet (15 mg total) by mouth every 4 (four) hours as needed for pain. 05/07/16   Eloise Levels, MD  simvastatin (ZOCOR) 40 MG tablet Take 1 tablet (40 mg total) by mouth every evening. 08/16/17   Lovenia Kim, MD  traZODone (DESYREL) 50 MG tablet TAKE 1/2 TO 1 TABLET BY MOUTH EVERY NIGHT AT BEDTIME AS NEEDED FOR SLEEP 07/26/17   Wyatt Coombe, MD  traZODone (DESYREL) 50 MG tablet Take 1 tablet (50 mg total) by mouth at bedtime. 08/02/17   Wyatt Bing, DO  triamcinolone cream (KENALOG) 0.5 % Apply 1 application topically 2 (two) times daily. 05/12/17   Eloise Levels, MD  varenicline (CHANTIX) 0.5 MG tablet TAKE 1 TABLET BY MOUTH ONCE DAILY FOR 3 DAYS, THEN 1 TWICE DAILY FOR 4 DAYS, THEN 2 TABLETS TWICE DAILY 02/09/17   Sherene Sires, DO    Allergies:   Bupropion   Social History   Socioeconomic History  . Marital status: Single    Spouse name: Not on file  . Number of children: 4  . Years of education: Not on file  . Highest education level: Not on file  Occupational History  . Occupation: Part-time  Social Needs  . Financial resource strain: Not on file  . Food insecurity:    Worry: Not on file    Inability: Not on file  . Transportation needs:    Medical: Not on file    Non-medical: Not on file  Tobacco Use  . Smoking status: Former Smoker    Packs/day: 0.50    Years: 37.00    Pack years: 18.50    Types: Cigarettes    Start date: 02/03/1979    Last attempt to quit: 07/02/2017    Years since quitting: 0.1  . Smokeless tobacco: Never Used  Substance and Sexual Activity  . Alcohol use: Yes    Alcohol/week: 0.0 oz    Comment: 11/07/2015 "might drink once or twice a year"  . Drug use: No  .  Sexual activity: Yes  Lifestyle  . Physical activity:    Days per week: Not on file    Minutes per session: Not on file  . Stress: Not on file  Relationships  . Social connections:    Talks on phone: Not on file    Gets together: Not on file    Attends religious service: Not on file    Active member of club or organization: Not on file    Attends meetings of clubs or organizations: Not on file    Relationship status: Not on file  Other Topics  Concern  . Not on file  Social History Narrative   Health Care POA:    Emergency Contact:    End of Life Plan:    Who lives with you: Will be living with daughter   Any pets: none   Diet: Patient has a varied diet and tried not to eat red meat.   Exercise: Patient does not have a regular exercise routine.   Seatbelts: Patient reports wearing seatbelt when in vehicle.   Nancy Fetter Exposure/Protection:    Hobbies: cooking           Family History:  The patient's family history includes Heart attack in his maternal grandfather and maternal grandmother; Heart disease in his sister; Hypertension in his father and mother; Liver cancer in his brother.   ROS:   Please see the history of present illness.    ROS All other systems reviewed and are negative.   PHYSICAL EXAM:   VS:  BP 101/68   Pulse 83   Ht 5\' 7"  (1.702 m)   Wt 183 lb 3.2 oz (83.1 kg)   BMI 28.69 kg/m    GEN: Well nourished, well developed, in no acute distress  HEENT: normal  Neck: no JVD, carotid bruits, or masses Cardiac: RRR; no murmurs, rubs, or gallops,no edema  Respiratory:  clear to auscultation bilaterally, normal work of breathing GI: soft, nontender, nondistended, + BS MS: no deformity or atrophy  Skin: warm and dry, no rash Neuro:  Alert and Oriented x 3, Strength and sensation are intact Psych: euthymic mood, full affect  Wt Readings from Last 3 Encounters:  08/24/17 183 lb 3.2 oz (83.1 kg)  08/16/17 191 lb 12.8 oz (87 kg)  08/02/17 186 lb 4.8 oz (84.5 kg)       Studies/Labs Reviewed:   EKG:  EKG is not ordered today.   Recent Labs: 08/16/2017: BUN 8; Creatinine, Ser 0.98; Hemoglobin 12.9; Platelets 223; Potassium 4.5; Sodium 141; TSH 2.170   Lipid Panel    Component Value Date/Time   CHOL 160 11/23/2016 1439   TRIG 276 (H) 11/23/2016 1439   HDL 50 11/23/2016 1439   CHOLHDL 3.2 11/23/2016 1439   CHOLHDL 2.8 10/25/2015 1049   VLDL 23 10/25/2015 1049   LDLCALC 55 11/23/2016 1439    Additional studies/ records that were reviewed today include:     Left Heart Cath and Coronary Angiography 08/26/15  Conclusion    Mid LAD lesion, 25% stenosed. Nonobstructive CAD.  The left ventricular systolic function is normal.  Short aortic arch making catheter torquing, particularly for the left coronary artery, difficult.  Moderately elevated LVEDP.   Continue aggressive preventive therapy.  Continue to avoid tobacco.  I encouraged a healthy diet, DM control and weight control.   May need some diuretics for elevated LVEDP.  He will f/u with Dr. Harrington Challenger.     ASSESSMENT & PLAN:    1. Dizziness Like likely related to orthostatic hypotension.  No syncope.  He is on amitriptyline for many years.  This could lead to dizziness.  However, his symptoms started 2 months ago.  Advised to follow-up with PCP. -Recent Doppler showed progression of disease.  Follow-up with vascular as scheduled.  2. S/p L CEA - Last doppler showed progression of disease to 40-59% stenosis in left carotid. Follow up with [facility].   3.  Orthostatic hypotension with history of hypertension -  Orthostatic VS for the past 24 hrs:  BP- Lying Pulse- Lying BP- Sitting Pulse- Sitting BP- Standing at  0 minutes Pulse- Standing at 0 minutes  08/24/17 1523 101/68 83 98/65 83 (!) 79/61 88   Patient has symptomatic orthostatic hypotension.  Will stop enalapril.  Continue metoprolol 25 mg twice daily.  Follow-up in hypertension clinic in few weeks.  Medication Adjustments/Labs and  Tests Ordered: Current medicines are reviewed at length with the patient today.  Concerns regarding medicines are outlined above.  Medication changes, Labs and Tests ordered today are listed in the Patient Instructions below. Patient Instructions  Medication Instructions:  1. STOP ENALAPRIL   2. CONTINUE ALL OTHER MEDICATIONS AS PRESCIRBED  Labwork: NONE ORDERED TODAY  Testing/Procedures: NONE ORDERED TODAY  Follow-Up: PLEASE SCHEDULE APPT TO SEE DR. ROSS SOMETIME IN [place] OR SEPT.   PLEASE SCHEDULE AN APPT WITH THE HYPERTENSION CLINIC TO BE SEEN IN 3-4 WEEKS   Any Other Special Instructions Will Be Listed Below (If Applicable).   If you need a refill on your cardiac medications before your next appointment, please call your pharmacy.      Jarrett Soho, [place]  08/24/2017 3:52 PM    [facility] [address], [place], [place]  [address] Phone: [phone]; Fax: [phone]

## 2017-08-24 ENCOUNTER — Encounter: Payer: Self-pay | Admitting: Physician Assistant

## 2017-08-24 ENCOUNTER — Ambulatory Visit (INDEPENDENT_AMBULATORY_CARE_PROVIDER_SITE_OTHER): Payer: Medicare Other | Admitting: Physician Assistant

## 2017-08-24 VITALS — BP 101/68 | HR 83 | Ht 67.0 in | Wt 183.2 lb

## 2017-08-24 DIAGNOSIS — E785 Hyperlipidemia, unspecified: Secondary | ICD-10-CM

## 2017-08-24 DIAGNOSIS — I6522 Occlusion and stenosis of left carotid artery: Secondary | ICD-10-CM | POA: Diagnosis not present

## 2017-08-24 DIAGNOSIS — R42 Dizziness and giddiness: Secondary | ICD-10-CM

## 2017-08-24 DIAGNOSIS — I951 Orthostatic hypotension: Secondary | ICD-10-CM | POA: Diagnosis not present

## 2017-08-24 DIAGNOSIS — I10 Essential (primary) hypertension: Secondary | ICD-10-CM | POA: Diagnosis not present

## 2017-08-24 HISTORY — DX: Dizziness and giddiness: R42

## 2017-08-24 NOTE — Assessment & Plan Note (Addendum)
Two episodes of dizziness with associated shortness of breath.  Suspect due to a panic attack, however patient denies any current stressors.  Symptoms appear to resolve within 1-2 minutes.  No known prior history of anxiety or panic attack and he is not on any medications for this.  Denies other symptoms of chest pain, palpitations, leg swelling, or vision changes.  Could suspect underlying cardiac etiology given his significant history of L carotid artery stenosis s/p CEA in 2017. -EKG normal today without any evidence of ischemia or arrhythmia and patient currently feeling well. -TSH within normal limits, BMP and CBC without abnormalities -Referral to cardiology for further workup; -Strict return precautions discussed

## 2017-08-24 NOTE — Patient Instructions (Signed)
Medication Instructions:  1. STOP ENALAPRIL   2. CONTINUE ALL OTHER MEDICATIONS AS PRESCIRBED  Labwork: NONE ORDERED TODAY  Testing/Procedures: NONE ORDERED TODAY  Follow-Up: PLEASE SCHEDULE APPT TO SEE DR. ROSS SOMETIME IN [place] OR SEPT.   PLEASE SCHEDULE AN APPT WITH THE HYPERTENSION CLINIC TO BE SEEN IN 3-4 WEEKS   Any Other Special Instructions Will Be Listed Below (If Applicable).   If you need a refill on your cardiac medications before your next appointment, please call your pharmacy.

## 2017-09-15 ENCOUNTER — Ambulatory Visit (INDEPENDENT_AMBULATORY_CARE_PROVIDER_SITE_OTHER): Payer: Medicare Other | Admitting: Pharmacist

## 2017-09-15 VITALS — BP 144/92 | HR 75

## 2017-09-15 DIAGNOSIS — F17200 Nicotine dependence, unspecified, uncomplicated: Secondary | ICD-10-CM

## 2017-09-15 DIAGNOSIS — I6522 Occlusion and stenosis of left carotid artery: Secondary | ICD-10-CM

## 2017-09-15 DIAGNOSIS — I10 Essential (primary) hypertension: Secondary | ICD-10-CM | POA: Diagnosis not present

## 2017-09-15 MED ORDER — ENALAPRIL MALEATE 2.5 MG PO TABS: 2.5000 mg | ORAL_TABLET | Freq: Every day | ORAL | 11 refills | Status: DC

## 2017-09-15 MED ORDER — VARENICLINE TARTRATE 0.5 MG X 11 & 1 MG X 42 PO MISC: ORAL | 0 refills | Status: DC

## 2017-09-15 MED ORDER — VARENICLINE TARTRATE 1 MG PO TABS: 1.0000 mg | ORAL_TABLET | Freq: Two times a day (BID) | ORAL | 2 refills | Status: DC

## 2017-09-15 NOTE — Patient Instructions (Addendum)
It was nice to see you today  Your blood pressure goal is < 130/4mmHg  Restart a lower dose of your enalapril 2.5mg  once a day  Continue taking your other medications  Stay hydrated with water  Start your Chantix prescription. Your dosing for the first month is as follows:  Take one 0.5 mg tablet by mouth daily for 3 days, then increase to one 0.5 mg tablet twice daily for 4 days, then increase to one 1 mg tablet twice daily.  Follow up with your primary care doctor in 2 weeks as scheduled  Call clinic with any concerns [phone]

## 2017-09-15 NOTE — Progress Notes (Signed)
Patient ID: Wyatt Sandoval                 DOB: Aug 29, 1960                      MRN: [id number]     HPI: Wyatt Sandoval is a 57 y.o. male patient of Dr Harrington Challenger referred by Robbie Lis, PA to HTN clinic. PMH is significant for DM, HTN, HLD, and CAD s/p left carotid endarterectomy. He presented to clinic 3 weeks ago with complaints of dizziness for the past 3-4 months, worse while changing positions and after driving. Episodes would last for approximately 10 minutes then gradually resolve. Orthostatic vitals were taken at last visit with BP laying down: 101/68, sitting: 98/65, and standing: 79/61. Amitriptyline can cause orthostasis, however pt has been taking this for years and he has only experienced dizziness for the past few months. His enalapril was stopped and pt presents today for follow up.  Patient presents today in good spirits. He reports feeling a bit tired lately and has a few headaches since his last visit. He has not had any dizzy spells. Prior to his enalapril d/c, he was experiencing them 1-2x a month, accompanied with sweating. Reports his blood sugar has been well controlled, no notable hypoglycemic episodes. He does not check his BP at home. He stays hydrated with water. He is smoking 4-5 cigarettes per day, down from 1 PPD. He is interested in quitting and previously used Chantix with success.  Current HTN meds: metoprolol tartrate 25mg  BID Previously tried: enalapril 5mg  daily - hypotension BP goal: <130/40mmHg  Family History: The patient's family history includes Heart attack in his maternal grandfather and maternal grandmother; Heart disease in his sister; Hypertension in his father and mother; Liver cancer in his brother.   Social History: Former smoker 1/2 PPD for 37 years, now smoking 4-5 cigarettes per day down from 1 PPD. Denies alcohol and illicit drug use.  Diet: Eats a lot of salads, chicken, fish. Avoids red meat. Rarely adds salt to food. Drinks caffeinated coffee 4-5x  a day  Exercise: Walks regularly with work  Home BP readings: Has not been checking his BP at home.  Wt Readings from Last 3 Encounters:  08/24/17 183 lb 3.2 oz (83.1 kg)  08/16/17 191 lb 12.8 oz (87 kg)  08/02/17 186 lb 4.8 oz (84.5 kg)   BP Readings from Last 3 Encounters:  08/24/17 101/68  08/16/17 (!) 142/78  08/02/17 93/64   Pulse Readings from Last 3 Encounters:  08/24/17 83  08/16/17 92  08/02/17 72    Renal function: CrCl cannot be calculated (Patient's most recent lab result is older than the maximum 21 days allowed.).  Past Medical History:  Diagnosis Date  . Chronic lower back pain    stemming from multiple work accidents in mid 1990s  -- has been followed by pain clinic since then  . Daily headache   . Hepatitis C   . Hyperlipidemia   . Hypertension   . Left-sided carotid artery disease ([place])   . Type II diabetes mellitus ([place])   . Wears glasses     Current Outpatient Medications on File Prior to Visit  Medication Sig Dispense Refill  . amitriptyline (ELAVIL) 150 MG tablet Take 1 tablet (150 mg total) by mouth at bedtime. 90 tablet 0  . aspirin 81 MG tablet Take 1 tablet (81 mg total) by mouth daily. 30 tablet 11  . gabapentin (NEURONTIN) 300 MG capsule  Take 1 capsule (300 mg total) by mouth daily. 30 capsule 3  . LUMIGAN 0.01 % SOLN Place 1 drop in to both eyes at bedtime 1 Bottle 11  . metFORMIN (GLUCOPHAGE) 1000 MG tablet TAKE 1 TABLET BY MOUTH TWICE DAILY WITH A MEAL 180 tablet 0  . methocarbamol (ROBAXIN) 500 MG tablet Take 1 tablet (500 mg total) by mouth 2 (two) times daily. 20 tablet 0  . metoprolol tartrate (LOPRESSOR) 25 MG tablet Take 1 tablet (25 mg total) by mouth 2 (two) times daily. 180 tablet 3  . nicotine (NICODERM CQ - DOSED IN MG/24 HOURS) 21 mg/24hr patch Place 1 patch (21 mg total) onto the skin daily. 28 patch 0  . nitroGLYCERIN (NITROSTAT) 0.4 MG SL tablet ONE TABLET UNDER TONGUE AS NEEDED FOR CHEST PAIN EVERY 5 MINUTES. TAKE UP TO 3  DOSES FOR CHEST PAIN 225 tablet 0  . omeprazole (PRILOSEC) 40 MG capsule Take 1 capsule (40 mg total) by mouth daily. 90 capsule 0  . oxyCODONE (ROXICODONE) 15 MG immediate release tablet Take 1 tablet (15 mg total) by mouth every 4 (four) hours as needed for pain. 30 tablet 0  . simvastatin (ZOCOR) 40 MG tablet Take 1 tablet (40 mg total) by mouth every evening. 90 tablet 0  . traZODone (DESYREL) 50 MG tablet TAKE 1/2 TO 1 TABLET BY MOUTH EVERY NIGHT AT BEDTIME AS NEEDED FOR SLEEP 90 tablet 0  . traZODone (DESYREL) 50 MG tablet Take 1 tablet (50 mg total) by mouth at bedtime. 30 tablet 0  . triamcinolone cream (KENALOG) 0.5 % Apply 1 application topically 2 (two) times daily. 30 g 0  . varenicline (CHANTIX) 0.5 MG tablet TAKE 1 TABLET BY MOUTH ONCE DAILY FOR 3 DAYS, THEN 1 TWICE DAILY FOR 4 DAYS, THEN 2 TABLETS TWICE DAILY 53 tablet 2   No current facility-administered medications on file prior to visit.     Allergies  Allergen Reactions  . Bupropion Other (See Comments)    Memory and thinking impairment - dysphoria     Assessment/Plan:  1. Hypertension - BP has increased significantly from last visit and upon recent BP checks, seems to fluctuate between hypotension and hypertension. Goal BP < 130/56mmHg. Will resume lower dose of enalapril 2.5mg  daily to help stabilize BP readings and for renal protection due to concomitant DM. Recent headaches may be due to higher BP readings. Will continue metoprolol tartrate 25mg  BID. Pt prefers to have f/u BP check at PCP since is already scheduled to see them in 2 weeks. F/u in HTN clinic as needed.  2. Tobacco abuse - Pt currently smoking 4-5 cigarettes per day, down from 1 PPD. He is interested in quitting and reported previous success with Chantix. Sent in rx for starter pack and continuing pack for Chantix.   Defne Gerling E. Moussa Wiegand, PharmD, CPP, [place] [address] [place], [place] [address] Phone: [phone];  Fax: [phone] 09/15/2017 12:00 PM

## 2017-09-21 ENCOUNTER — Encounter: Payer: Medicare Other | Admitting: Family Medicine

## 2017-10-01 ENCOUNTER — Encounter: Payer: Self-pay | Admitting: Family Medicine

## 2017-10-01 ENCOUNTER — Ambulatory Visit (INDEPENDENT_AMBULATORY_CARE_PROVIDER_SITE_OTHER): Payer: Medicare Other | Admitting: Family Medicine

## 2017-10-01 VITALS — BP 125/80 | HR 88 | Temp 97.8°F | Ht 67.0 in | Wt 183.8 lb

## 2017-10-01 DIAGNOSIS — I10 Essential (primary) hypertension: Secondary | ICD-10-CM | POA: Diagnosis not present

## 2017-10-01 DIAGNOSIS — F17200 Nicotine dependence, unspecified, uncomplicated: Secondary | ICD-10-CM | POA: Diagnosis not present

## 2017-10-01 DIAGNOSIS — R21 Rash and other nonspecific skin eruption: Secondary | ICD-10-CM

## 2017-10-01 DIAGNOSIS — I6522 Occlusion and stenosis of left carotid artery: Secondary | ICD-10-CM

## 2017-10-01 DIAGNOSIS — E1159 Type 2 diabetes mellitus with other circulatory complications: Secondary | ICD-10-CM

## 2017-10-01 LAB — POCT GLYCOSYLATED HEMOGLOBIN (HGB A1C): HBA1C, POC (CONTROLLED DIABETIC RANGE): 6.7 % (ref 0.0–7.0)

## 2017-10-01 MED ORDER — BUTENAFINE HCL 1 % EX CREA: 1.0000 "application " | TOPICAL_CREAM | Freq: Two times a day (BID) | CUTANEOUS | 0 refills | Status: AC

## 2017-10-01 NOTE — Assessment & Plan Note (Addendum)
Chronic.  Controlled.  A1c 6.7.  Metformin only. - Continue metformin 1000 mg daily and continue avoiding excessive carbohydrates - Next A1c due 12/01/2017 - Advised patient to contact ophthalmologist for annual diabetic eye exam - Diabetic foot exam performed and updated - RTC 3 months

## 2017-10-01 NOTE — Progress Notes (Signed)
Subjective   Patient ID: Wyatt Sandoval    DOB: 1960/05/24, 57 y.o. male   MRN: [id number]  CC: "Diabetes"  HPI: Wyatt Sandoval is a 57 y.o. male who presents to clinic today for the following:  Diabetes: Patient is here today for follow-up.  Tolerating metformin without history of insulin use.  Does not check his CBGs.  Patient has been avoiding excessive carbohydrate intake.  He is overdue for his annual diabetic eye exam.  Patient denies any vision changes, numbness or tingling in the extremities, nausea or vomiting, abdominal pain.  Hypertension: Patient was evaluated by cardiology for dizziness thought to be related to orthostatic hypotension.  Was discontinued on enalapril and restarted back on half dose at 2.5 mg daily.  Patient also on Lopressor and daily statin and aspirin therapy.  He recently discontinued smoking as of 1 week ago and is on Chantix.  Smoking: Patient has a long history of smoking and is currently on Chantix for the last week.  He has cut down from 1 pack a day to 1-2 cigarettes/day.  He denies fevers or chills, cough, shortness of breath, chest pain, unexplained weight loss.  Toe rash: Ongoing issue without relief using triamcinolone cream.  Has not tried antifungals.  Patient endorsing burning sensation between toes.  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  ROS: see HPI for pertinent.  [place]: Controlled NIDDM, CAD with bilateral carotid stenosis, HTN, hepatitis C (treated), open angle glaucoma, tobacco use disorder, chronic pain.  Surgical history L-CEA (2017), R-carpal tunnel release, cardiac cath (2017), anterior cervical discectomy.  Family history HTN, heart disease, pancreatic cancer (brother).  Smoking status reviewed. Medications reviewed.  Objective   BP 125/80   Pulse 88   Temp 97.8 F (36.6 C)   Ht 5\' 7"  (1.702 m)   Wt 183 lb 12.8 oz (83.4 kg)   SpO2 97%   BMI 28.79 kg/m  Vitals and nursing note reviewed.  General: well nourished,  well developed, NAD with non-toxic appearance HEENT: normocephalic, atraumatic, moist mucous membranes Neck: supple, non-tender without lymphadenopathy Cardiovascular: regular rate and rhythm without murmurs, rubs, or gallops Lungs: clear to auscultation bilaterally with normal work of breathing Abdomen: soft, non-tender, non-distended, normoactive bowel sounds Skin: warm, dry, cap refill < 2 seconds, interdigital rash of feet with erythema and mild desquamation Extremities: warm and well perfused, normal tone, no edema  Assessment & Plan   Rash Chronic.  Unresponsive to topical steroids.  May be fungal. - Initiating Lotrimin Ultra 1% cream twice daily for 1 week - Discontinue triamcinolone ointment - Advised patient to keep feet dry  Primary hypertension Chronic.  Controlled.  On beta-blocker and ACE inhibitor.  Smoker, actively quitting. - Continue enalapril 2.5 mill grams daily and Lopressor 25 mg twice daily  Tobacco use disorder Chronic.  Has multiple attempts of cessation.  Currently using Chantix, one weekend.  Continue smoking with 1 cigarette/day. - Congratulated patient for desire to stop smoking and advised patient to discontinue all cigarettes while on Chantix  Controlled type 2 diabetes mellitus with circulatory disorder, without long-term current use of insulin (HCC) Chronic.  Controlled.  A1c 6.7.  Metformin only. - Continue metformin 1000 mg daily and continue avoiding excessive carbohydrates - Next A1c due 12/01/2017 - Advised patient to contact ophthalmologist for annual diabetic eye exam - Diabetic foot exam performed and updated - RTC 3 months  Orders Placed This Encounter  Procedures  . HgB A1c   Meds ordered this encounter  Medications  . Butenafine HCl (LOTRIMIN ULTRA) 1 % cream    Sig: Apply 1 application topically 2 (two) times daily for 7 days.    Dispense:  12 g    Refill:  0    Harriet Butte, [place], PGY-2 10/01/2017,  4:01 PM

## 2017-10-01 NOTE — Assessment & Plan Note (Addendum)
Chronic.  Controlled.  On beta-blocker and ACE inhibitor.  Smoker, actively quitting. - Continue enalapril 2.5 mill grams daily and Lopressor 25 mg twice daily

## 2017-10-01 NOTE — Assessment & Plan Note (Signed)
Chronic.  Has multiple attempts of cessation.  Currently using Chantix, one weekend.  Continue smoking with 1 cigarette/day. - Congratulated patient for desire to stop smoking and advised patient to discontinue all cigarettes while on Chantix

## 2017-10-01 NOTE — Assessment & Plan Note (Addendum)
Chronic.  Unresponsive to topical steroids.  May be fungal. - Initiating Lotrimin Ultra 1% cream twice daily for 1 week - Discontinue triamcinolone ointment - Advised patient to keep feet dry

## 2017-10-01 NOTE — Patient Instructions (Signed)
Thank you for coming in to see [place] today. Please see below to review our plan for today's visit.  1.  I have given you a prescription for your toe rash.  Discontinue the triamcinolone ointment.  You can use the Lotrimin ultra cream twice daily for 1 week.  If this rash is fungal, this should resolve after completing the treatment in 1 week.  Let me know if it does not improve after completing the treatment.  Be sure to keep feet dry and wear clean socks and avoid moisture in the feet. 2.  Diabetes seems to be well-controlled.  Call and schedule for your annual diabetic eye exam.  We will see you again in 3 months to recheck your A1c. 3.  Your blood pressure is well controlled with the enalapril at the lower dose.  Continue as prescribed.  Please call the clinic at [phone] if your symptoms worsen or you have any concerns. It was our pleasure to serve you.  Harriet Butte, [place], PGY-2

## 2017-10-20 ENCOUNTER — Other Ambulatory Visit: Payer: Self-pay

## 2017-10-20 DIAGNOSIS — I10 Essential (primary) hypertension: Secondary | ICD-10-CM

## 2017-10-21 MED ORDER — METOPROLOL TARTRATE 25 MG PO TABS: 25.0000 mg | ORAL_TABLET | Freq: Two times a day (BID) | ORAL | 3 refills | Status: AC

## 2017-10-21 MED ORDER — TRAZODONE HCL 50 MG PO TABS: 50.0000 mg | ORAL_TABLET | Freq: Every day | ORAL | 0 refills | Status: DC

## 2017-10-22 ENCOUNTER — Other Ambulatory Visit: Payer: Self-pay

## 2017-10-22 DIAGNOSIS — E785 Hyperlipidemia, unspecified: Secondary | ICD-10-CM

## 2017-10-22 MED ORDER — SIMVASTATIN 40 MG PO TABS: 40.0000 mg | ORAL_TABLET | Freq: Every evening | ORAL | 0 refills | Status: DC

## 2017-11-05 ENCOUNTER — Other Ambulatory Visit: Payer: Self-pay

## 2017-11-05 DIAGNOSIS — K219 Gastro-esophageal reflux disease without esophagitis: Secondary | ICD-10-CM

## 2017-11-06 MED ORDER — OMEPRAZOLE 40 MG PO CPDR: 40.0000 mg | DELAYED_RELEASE_CAPSULE | Freq: Every day | ORAL | 0 refills | Status: DC

## 2017-11-15 ENCOUNTER — Other Ambulatory Visit: Payer: Self-pay | Admitting: *Deleted

## 2017-11-15 MED ORDER — AMITRIPTYLINE HCL 150 MG PO TABS: 150.0000 mg | ORAL_TABLET | Freq: Every day | ORAL | 0 refills | Status: DC

## 2017-12-21 ENCOUNTER — Other Ambulatory Visit: Payer: Self-pay | Admitting: Family Medicine

## 2017-12-22 ENCOUNTER — Other Ambulatory Visit: Payer: Self-pay | Admitting: Family Medicine

## 2017-12-22 NOTE — Telephone Encounter (Signed)
Needs appt for diabetes follow-up. Will give 1 month supply.  Harriet Butte, [place], PGY-3

## 2017-12-24 ENCOUNTER — Encounter (INDEPENDENT_AMBULATORY_CARE_PROVIDER_SITE_OTHER): Payer: Self-pay

## 2017-12-24 ENCOUNTER — Encounter: Payer: Self-pay | Admitting: Internal Medicine

## 2017-12-24 ENCOUNTER — Ambulatory Visit (INDEPENDENT_AMBULATORY_CARE_PROVIDER_SITE_OTHER): Payer: Medicare Other | Admitting: Internal Medicine

## 2017-12-24 VITALS — BP 138/80 | HR 86 | Ht 67.0 in | Wt 187.8 lb

## 2017-12-24 DIAGNOSIS — I10 Essential (primary) hypertension: Secondary | ICD-10-CM

## 2017-12-24 DIAGNOSIS — E785 Hyperlipidemia, unspecified: Secondary | ICD-10-CM | POA: Diagnosis not present

## 2017-12-24 DIAGNOSIS — I779 Disorder of arteries and arterioles, unspecified: Secondary | ICD-10-CM | POA: Diagnosis not present

## 2017-12-24 DIAGNOSIS — I6522 Occlusion and stenosis of left carotid artery: Secondary | ICD-10-CM

## 2017-12-24 DIAGNOSIS — I739 Peripheral vascular disease, unspecified: Secondary | ICD-10-CM

## 2017-12-24 MED ORDER — VARENICLINE TARTRATE 1 MG PO TABS: 1.0000 mg | ORAL_TABLET | Freq: Two times a day (BID) | ORAL | 0 refills | Status: DC

## 2017-12-24 NOTE — Patient Instructions (Signed)
Medication Instructions:  Your physician recommends that you continue on your current medications as directed. Please refer to the Current Medication list given to you today.   Labwork: TODAY - Cholesterol, Basic metabolic panel   Testing/Procedures: None Ordered   Follow-Up: Your physician wants you to follow-up in: March 2020 with Dr. Harrington Challenger. You will receive a reminder letter in the mail two months in advance. If you don't receive a letter, please call our office to schedule the follow-up appointment.   If you need a refill on your cardiac medications before your next appointment, please call your pharmacy.   Thank you for choosing [facility]! Christen Bame, RN [phone]

## 2017-12-24 NOTE — Progress Notes (Signed)
Patient ID: Wyatt Sandoval                 DOB: 1960/07/25                      MRN: [id number]       Cardiology Office Note   Date:  12/26/2017   ID:  Wyatt Sandoval, DOB 06-16-1960, MRN [id number]  PCP:  Taos Bing, DO  Cardiologist:   Dorris Carnes, MD    Pt presents for f/u of HTN     History of Present Illness:  HPI: Wyatt Sandoval is a 57 y.o. male with history of  DM, HTN, HLD,  CV dz Is/p left carotid endarterectomy). He presented to clinic in past with complaints of dizziness / orthostatic symtpoms   Found to be severly orthostatic in pas   Meds changed   ACE I stopped    HE was last seen in cardiology by Sundra Aland in HTN clinic in May 2019  BP was up and down at time   Low dose enalopril was added bak to regimen 2.5 mg   Since seen he has done OK  Denies dizziness  Breathing is OK Still smoking     Current Meds  Medication Sig  . amitriptyline (ELAVIL) 150 MG tablet Take 1 tablet (150 mg total) by mouth at bedtime.  Marland Kitchen aspirin 81 MG tablet Take 1 tablet (81 mg total) by mouth daily.  . enalapril (VASOTEC) 2.5 MG tablet Take 1 tablet (2.5 mg total) by mouth daily.  Marland Kitchen gabapentin (NEURONTIN) 300 MG capsule Take 1 capsule (300 mg total) by mouth daily.  Marland Kitchen LUMIGAN 0.01 % SOLN Place 1 drop in to both eyes at bedtime  . metFORMIN (GLUCOPHAGE) 1000 MG tablet TAKE 1 TABLET BY MOUTH TWICE DAILY WITH A MEAL  . methocarbamol (ROBAXIN) 500 MG tablet Take 1 tablet (500 mg total) by mouth 2 (two) times daily.  . metoprolol tartrate (LOPRESSOR) 25 MG tablet Take 1 tablet (25 mg total) by mouth 2 (two) times daily.  . nicotine (NICODERM CQ - DOSED IN MG/24 HOURS) 21 mg/24hr patch Place 1 patch (21 mg total) onto the skin daily.  . nitroGLYCERIN (NITROSTAT) 0.4 MG SL tablet ONE TABLET UNDER TONGUE AS NEEDED FOR CHEST PAIN EVERY 5 MINUTES. TAKE UP TO 3 DOSES FOR CHEST PAIN  . omeprazole (PRILOSEC) 40 MG capsule Take 1 capsule (40 mg total) by mouth daily.  Marland Kitchen oxyCODONE (ROXICODONE)  15 MG immediate release tablet Take 1 tablet (15 mg total) by mouth every 4 (four) hours as needed for pain.  . simvastatin (ZOCOR) 40 MG tablet Take 1 tablet (40 mg total) by mouth every evening.  . traZODone (DESYREL) 50 MG tablet TAKE 1 TABLET(50 MG) BY MOUTH AT BEDTIME  . triamcinolone cream (KENALOG) 0.5 % Apply 1 application topically 2 (two) times daily.  . varenicline (CHANTIX CONTINUING MONTH PAK) 1 MG tablet Take 1 tablet (1 mg total) by mouth 2 (two) times daily.  . [DISCONTINUED] varenicline (CHANTIX CONTINUING MONTH PAK) 1 MG tablet Take 1 tablet (1 mg total) by mouth 2 (two) times daily.  . [DISCONTINUED] varenicline (CHANTIX STARTING MONTH PAK) 0.5 MG X 11 & 1 MG X 42 tablet Follow instructions for starter pack     Allergies:   Bupropion   Past Medical History:  Diagnosis Date  . Chronic lower back pain    stemming from multiple work accidents in mid 1990s  -- has been followed by  pain clinic since then  . Daily headache   . Hepatitis C   . Hyperlipidemia   . Hypertension   . Left-sided carotid artery disease ([place])   . Type II diabetes mellitus ([place])   . Wears glasses     Past Surgical History:  Procedure Laterality Date  . ANTERIOR CERVICAL DECOMP/DISCECTOMY FUSION  2001; 2002  . BACK SURGERY    . CARDIAC CATHETERIZATION N/A 08/26/2015   Procedure: Left Heart Cath and Coronary Angiography;  Surgeon: Jettie Booze, MD;  Location: [facility];  Service: Cardiovascular;  Laterality: N/A;  . CAROTID ENDARTERECTOMY Left 11/07/2015  . CARPAL TUNNEL RELEASE Right ~ 2015  . COLONOSCOPY    . ENDARTERECTOMY Left 11/07/2015   Procedure: LEFT CAROTID ENDARTERECTOMY ;  Surgeon: Serafina Mitchell, MD;  Location: [facility] OR;  Service: Vascular;  Laterality: Left;     Social History:  The patient  reports that he quit smoking about 5 months ago. His smoking use included cigarettes. He started smoking about 38 years ago. He has a 18.50 pack-year smoking history. He has never  used smokeless tobacco. He reports that he drinks alcohol. He reports that he does not use drugs.   Family History:  The patient's family history includes Heart attack in his maternal grandfather and maternal grandmother; Heart disease in his sister; Hypertension in his father and mother; Liver cancer in his brother.    ROS:  Please see the history of present illness. All other systems are reviewed and  Negative to the above problem except as noted.    PHYSICAL EXAM: VS:  BP 138/80 (BP Location: Right Arm, Patient Position: Sitting, Cuff Size: Normal)   Pulse 86   Ht 5\' 7"  (1.702 m)   Wt 187 lb 12.8 oz (85.2 kg)   SpO2 95%   BMI 29.41 kg/m   GEN: Overweight 57 yo  in no acute distress  HEENT: normal  Neck: JVP is normal  No, carotid bruits, or masses Cardiac: RRR; no murmurs, rubs, or gallops,no edema  Respiratory:  clear to auscultation bilaterally, normal work of breathing GI: soft, nontender, nondistended, + BS  No hepatomegaly  MS: no deformity Moving all extremities   Skin: warm and dry, no rash Neuro:  Strength and sensation are intact Psych: euthymic mood, full affect   EKG:  EKG is not ordered today.   Lipid Panel    Component Value Date/Time   CHOL 150 12/24/2017 1159   TRIG 65 12/24/2017 1159   HDL 63 12/24/2017 1159   CHOLHDL 2.4 12/24/2017 1159   CHOLHDL 2.8 10/25/2015 1049   VLDL 23 10/25/2015 1049   LDLCALC 74 12/24/2017 1159      Wt Readings from Last 3 Encounters:  12/24/17 187 lb 12.8 oz (85.2 kg)  10/01/17 183 lb 12.8 oz (83.4 kg)  08/24/17 183 lb 3.2 oz (83.1 kg)      ASSESSMENT AND PLAN:  1   HTN   Pt's BP is fair   I am reluctant to push down further given his orthostasis in past   Would f/u in March Check BMET  2   CV dz   S/p CEA   Control lipids  3   Lipids   Will check today  On simvistatin 40  4   DM   On oral agents      Current medicines are reviewed at length with the patient today.  The patient does not have concerns  regarding medicines.  Signed, Nevin Bloodgood  Harrington Challenger, MD  12/26/2017 11:08 AM    [address], [place], [place]  [address] Phone: [phone]; Fax: [phone]

## 2017-12-25 LAB — LIPID PANEL
CHOLESTEROL TOTAL: 150 mg/dL (ref 100–199)
Chol/HDL Ratio: 2.4 ratio (ref 0.0–5.0)
HDL: 63 mg/dL (ref >39)
LDL CALC: 74 mg/dL (ref 0–99)
Triglycerides: 65 mg/dL (ref 0–149)
VLDL Cholesterol Cal: 13 mg/dL (ref 5–40)

## 2017-12-25 LAB — BASIC METABOLIC PANEL
BUN/Creatinine Ratio: 12 (ref 9–20)
BUN: 12 mg/dL (ref 6–24)
CALCIUM: 9.6 mg/dL (ref 8.7–10.2)
CHLORIDE: 103 mmol/L (ref 96–106)
CO2: 22 mmol/L (ref 20–29)
Creatinine, Ser: 1.03 mg/dL (ref 0.76–1.27)
GFR calc Af Amer: 95 mL/min/{1.73_m2} (ref >59)
GFR, EST NON AFRICAN AMERICAN: 82 mL/min/{1.73_m2} (ref >59)
Glucose: 120 mg/dL — ABNORMAL HIGH (ref 65–99)
Potassium: 4.3 mmol/L (ref 3.5–5.2)
Sodium: 142 mmol/L (ref 134–144)

## 2017-12-29 ENCOUNTER — Other Ambulatory Visit: Payer: Self-pay

## 2017-12-29 DIAGNOSIS — I6523 Occlusion and stenosis of bilateral carotid arteries: Secondary | ICD-10-CM

## 2018-01-13 ENCOUNTER — Other Ambulatory Visit: Payer: Self-pay | Admitting: Family Medicine

## 2018-01-13 DIAGNOSIS — E119 Type 2 diabetes mellitus without complications: Secondary | ICD-10-CM

## 2018-01-21 ENCOUNTER — Other Ambulatory Visit: Payer: Self-pay | Admitting: *Deleted

## 2018-01-21 ENCOUNTER — Other Ambulatory Visit: Payer: Self-pay | Admitting: Family Medicine

## 2018-01-21 DIAGNOSIS — E785 Hyperlipidemia, unspecified: Secondary | ICD-10-CM

## 2018-01-21 DIAGNOSIS — K219 Gastro-esophageal reflux disease without esophagitis: Secondary | ICD-10-CM

## 2018-01-22 MED ORDER — OMEPRAZOLE 40 MG PO CPDR: 40.0000 mg | DELAYED_RELEASE_CAPSULE | Freq: Every day | ORAL | 3 refills | Status: AC

## 2018-01-24 ENCOUNTER — Other Ambulatory Visit: Payer: Self-pay | Admitting: Family Medicine

## 2018-02-01 ENCOUNTER — Encounter: Payer: Self-pay | Admitting: Family

## 2018-02-01 ENCOUNTER — Ambulatory Visit (INDEPENDENT_AMBULATORY_CARE_PROVIDER_SITE_OTHER): Payer: Medicare Other | Admitting: Family

## 2018-02-01 ENCOUNTER — Ambulatory Visit (HOSPITAL_COMMUNITY)
Admission: RE | Admit: 2018-02-01 | Discharge: 2018-02-01 | Disposition: A | Discharge status: Home / Self Care | Payer: Medicare Other | Source: Ambulatory Visit | Attending: Family | Admitting: Family

## 2018-02-01 ENCOUNTER — Other Ambulatory Visit: Payer: Self-pay

## 2018-02-01 VITALS — BP 140/80 | HR 69 | Temp 97.1°F | Resp 20 | Ht 67.0 in | Wt 194.0 lb

## 2018-02-01 DIAGNOSIS — I6522 Occlusion and stenosis of left carotid artery: Secondary | ICD-10-CM | POA: Diagnosis not present

## 2018-02-01 DIAGNOSIS — Z9889 Other specified postprocedural states: Secondary | ICD-10-CM

## 2018-02-01 DIAGNOSIS — W57XXXA Bitten or stung by nonvenomous insect and other nonvenomous arthropods, initial encounter: Secondary | ICD-10-CM

## 2018-02-01 DIAGNOSIS — F17200 Nicotine dependence, unspecified, uncomplicated: Secondary | ICD-10-CM

## 2018-02-01 DIAGNOSIS — I6523 Occlusion and stenosis of bilateral carotid arteries: Secondary | ICD-10-CM

## 2018-02-01 DIAGNOSIS — S80862A Insect bite (nonvenomous), left lower leg, initial encounter: Secondary | ICD-10-CM | POA: Diagnosis not present

## 2018-02-01 NOTE — Patient Instructions (Addendum)
For swelling of insect bite on your leg: Calamine lotion, helps dry out and draw out Or Caladryl lotion which also has Benadryl to help with itch  See your primary care provider about this as soon as possible   Steps to Quit Smoking Smoking tobacco can be bad for your health. It can also affect almost every organ in your body. Smoking puts you and people around you at risk for many serious long-lasting (chronic) diseases. Quitting smoking is hard, but it is one of the best things that you can do for your health. It is never too late to quit. What are the benefits of quitting smoking? When you quit smoking, you lower your risk for getting serious diseases and conditions. They can include:  Lung cancer or lung disease.  Heart disease.  Stroke.  Heart attack.  Not being able to have children (infertility).  Weak bones (osteoporosis) and broken bones (fractures).  If you have coughing, wheezing, and shortness of breath, those symptoms may get better when you quit. You may also get sick less often. If you are pregnant, quitting smoking can help to lower your chances of having a baby of low birth weight. What can I do to help me quit smoking? Talk with your doctor about what can help you quit smoking. Some things you can do (strategies) include:  Quitting smoking totally, instead of slowly cutting back how much you smoke over a period of time.  Going to in-person counseling. You are more likely to quit if you go to many counseling sessions.  Using resources and support systems, such as: ? Database administrator with a Social worker. ? Phone quitlines. ? Careers information officer. ? Support groups or group counseling. ? Text messaging programs. ? Mobile phone apps or applications.  Taking medicines. Some of these medicines may have nicotine in them. If you are pregnant or breastfeeding, do not take any medicines to quit smoking unless your doctor says it is okay. Talk with your doctor about  counseling or other things that can help you.  Talk with your doctor about using more than one strategy at the same time, such as taking medicines while you are also going to in-person counseling. This can help make quitting easier. What things can I do to make it easier to quit? Quitting smoking might feel very hard at first, but there is a lot that you can do to make it easier. Take these steps:  Talk to your family and friends. Ask them to support and encourage you.  Call phone quitlines, reach out to support groups, or work with a Social worker.  Ask people who smoke to not smoke around you.  Avoid places that make you want (trigger) to smoke, such as: ? Bars. ? Parties. ? Smoke-break areas at work.  Spend time with people who do not smoke.  Lower the stress in your life. Stress can make you want to smoke. Try these things to help your stress: ? Getting regular exercise. ? Deep-breathing exercises. ? Yoga. ? Meditating. ? Doing a body scan. To do this, close your eyes, focus on one area of your body at a time from head to toe, and notice which parts of your body are tense. Try to relax the muscles in those areas.  Download or buy apps on your mobile phone or tablet that can help you stick to your quit plan. There are many free apps, such as QuitGuide from the [employer] Office manager for Disease Control and Prevention). You can find  more support from smokefree.gov and other websites.  This information is not intended to replace advice given to you by your health care provider. Make sure you discuss any questions you have with your health care provider. Document Released: 02/14/2009 Document Revised: 12/17/2015 Document Reviewed: 09/04/2014 Elsevier Interactive Patient Education  2018 [employer].     Stroke Prevention Some health problems and behaviors may make it more likely for you to have a stroke. Below are ways to lessen your risk of having a stroke.  Be active for at least 30  minutes on most or all days.  Do not smoke. Try not to be around others who smoke.  Do not drink too much alcohol. ? Do not have more than 2 drinks a day if you are a man. ? Do not have more than 1 drink a day if you are a woman and are not pregnant.  Eat healthy foods, such as fruits and vegetables. If you were put on a specific diet, follow the diet as told.  Keep your cholesterol levels under control through diet and medicines. Look for foods that are low in saturated fat, trans fat, cholesterol, and are high in fiber.  If you have diabetes, follow all diet plans and take your medicine as told.  Ask your doctor if you need treatment to lower your blood pressure. If you have high blood pressure (hypertension), follow all diet plans and take your medicine as told by your doctor.  If you are 57-27 years old, have your blood pressure checked every 3-5 years. If you are age 75 or older, have your blood pressure checked every year.  Keep a healthy weight. Eat foods that are low in calories, salt, saturated fat, trans fat, and cholesterol.  Do not take drugs.  Avoid birth control pills, if this applies. Talk to your doctor about the risks of taking birth control pills.  Talk to your doctor if you have sleep problems (sleep apnea).  Take all medicine as told by your doctor. ? You may be told to take aspirin or blood thinner medicine. Take this medicine as told by your doctor. ? Understand your medicine instructions.  Make sure any other conditions you have are being taken care of.  Get help right away if:  You suddenly lose feeling (you feel numb) or have weakness in your face, arm, or leg.  Your face or eyelid hangs down to one side.  You suddenly feel confused.  You have trouble talking (aphasia) or understanding what people are saying.  You suddenly have trouble seeing in one or both eyes.  You suddenly have trouble walking.  You are dizzy.  You lose your balance or your  movements are clumsy (uncoordinated).  You suddenly have a very bad headache and you do not know the cause.  You have new chest pain.  Your heart feels like it is fluttering or skipping a beat (irregular heartbeat). Do not wait to see if the symptoms above go away. Get help right away. Call your local emergency services (911 in [place] Do not drive yourself to the hospital. This information is not intended to replace advice given to you by your health care provider. Make sure you discuss any questions you have with your health care provider. Document Released: 10/20/2011 Document Revised: 09/26/2015 Document Reviewed: 10/21/2012 Elsevier Interactive Patient Education  [employer].
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
## 2018-02-21 ENCOUNTER — Other Ambulatory Visit: Payer: Self-pay | Admitting: Family Medicine

## 2018-03-22 ENCOUNTER — Other Ambulatory Visit: Payer: Self-pay | Admitting: Family Medicine

## 2018-04-21 ENCOUNTER — Other Ambulatory Visit: Payer: Self-pay | Admitting: Family Medicine

## 2018-04-21 DIAGNOSIS — E119 Type 2 diabetes mellitus without complications: Secondary | ICD-10-CM

## 2018-07-22 ENCOUNTER — Other Ambulatory Visit: Payer: Self-pay | Admitting: Family Medicine

## 2018-07-22 DIAGNOSIS — E119 Type 2 diabetes mellitus without complications: Secondary | ICD-10-CM

## 2018-08-03 ENCOUNTER — Telehealth: Payer: Self-pay

## 2018-08-03 NOTE — Telephone Encounter (Signed)
Called pt to schedule e-visit appt. Pt declined and states he will reschedule at a later date.

## 2018-08-09 ENCOUNTER — Telehealth: Payer: Self-pay

## 2018-08-09 NOTE — Telephone Encounter (Signed)
   Pt declined virtual visit. Would like to wait for face to face visit.   Primary Cardiologist:  No primary care provider on file.   Patient contacted.  History reviewed.  No symptoms to suggest any unstable cardiac conditions.  Based on discussion, with current pandemic situation, we will be postponing this appointment for Wyatt Sandoval with a plan for f/u in 12 wks or sooner if feasible/necessary.  If symptoms change, he has been instructed to contact our office.   Routing to C19 CANCEL pool for tracking (P CV DIV CV19 CANCEL - reason for visit "other.") and assigning priority (1 = 4-6 wks, 2 = 6-12 wks, 3 = >12 wks).   Wilma Flavin, RN  08/09/2018 8:45 AM         .

## 2018-08-15 ENCOUNTER — Ambulatory Visit: Payer: Medicare Other | Admitting: Internal Medicine

## 2018-10-31 ENCOUNTER — Other Ambulatory Visit: Payer: Self-pay

## 2018-10-31 DIAGNOSIS — E119 Type 2 diabetes mellitus without complications: Secondary | ICD-10-CM

## 2018-10-31 MED ORDER — METFORMIN HCL 1000 MG PO TABS: ORAL_TABLET | ORAL | 0 refills | Status: AC

## 2018-11-07 DIAGNOSIS — K219 Gastro-esophageal reflux disease without esophagitis: Secondary | ICD-10-CM | POA: Insufficient documentation

## 2018-11-07 DIAGNOSIS — M2550 Pain in unspecified joint: Secondary | ICD-10-CM | POA: Insufficient documentation

## 2018-11-07 HISTORY — DX: Gastro-esophageal reflux disease without esophagitis: K21.9

## 2018-11-07 HISTORY — DX: Pain in unspecified joint: M25.50

## 2018-11-25 ENCOUNTER — Telehealth: Payer: Self-pay

## 2018-11-25 NOTE — Telephone Encounter (Signed)
    COVID-19 Pre-Screening Questions:  . In the past 7 to 10 days have you had a cough,  shortness of breath, headache, congestion, fever (100 or greater) body aches, chills, sore throat, or sudden loss of taste or sense of smell? NO . Have you been around anyone with known Covid 19. NO . Have you been around anyone who is awaiting Covid 19 test results in the past 7 to 10 days? YES (self) . Have you been around anyone who has been exposed to Covid 19, or has mentioned symptoms of Covid 19 within the past 7 to 10 days? NO  If you have any concerns/questions about symptoms patients report during screening (either on the phone or at threshold). Contact the provider seeing the patient or DOD for further guidance.  If neither are available contact a member of the leadership team.

## 2018-11-28 ENCOUNTER — Telehealth (INDEPENDENT_AMBULATORY_CARE_PROVIDER_SITE_OTHER): Payer: Medicare Other | Admitting: Physician Assistant

## 2018-11-28 ENCOUNTER — Other Ambulatory Visit: Payer: Self-pay

## 2018-11-28 ENCOUNTER — Encounter: Payer: Self-pay | Admitting: Physician Assistant

## 2018-11-28 VITALS — Ht 67.0 in | Wt 185.0 lb

## 2018-11-28 DIAGNOSIS — F17200 Nicotine dependence, unspecified, uncomplicated: Secondary | ICD-10-CM

## 2018-11-28 DIAGNOSIS — I10 Essential (primary) hypertension: Secondary | ICD-10-CM

## 2018-11-28 DIAGNOSIS — I6522 Occlusion and stenosis of left carotid artery: Secondary | ICD-10-CM

## 2018-11-28 DIAGNOSIS — E785 Hyperlipidemia, unspecified: Secondary | ICD-10-CM

## 2018-11-28 MED ORDER — CHANTIX STARTING MONTH PAK 0.5 MG X 11 & 1 MG X 42 PO TABS: ORAL_TABLET | ORAL | 0 refills | Status: DC

## 2018-11-28 NOTE — Patient Instructions (Addendum)
Medication Instructions:  Your physician has recommended you make the following change in your medication:  1.  START Chantix   If you need a refill on your cardiac medications before your next appointment, please call your pharmacy.   Lab work:  If you have labs (blood work) drawn today and your tests are completely normal, you will receive your results only by: Marland Kitchen MyChart Message (if you have MyChart) OR . A paper copy in the mail If you have any lab test that is abnormal or we need to change your treatment, we will call you to review the results.  Testing/Procedures: None ordered  Follow-Up: At [facility], you and your health needs are our priority.  As part of our continuing mission to provide you with exceptional heart care, we have created designated Provider Care Teams.  These Care Teams include your primary Cardiologist (physician) and Advanced Practice Providers (APPs -  Physician Assistants and Nurse Practitioners) who all work together to provide you with the care you need, when you need it. You will need a follow up appointment in:  12 months.  Please call our office 2 months in advance to schedule this appointment.  You may see No primary care provider on file. or one of the following Advanced Practice Providers on your designated Care Team: Richardson Dopp, PA-C [place], [place] . Daune Perch, NP  Any Other Special Instructions Will Be Listed Below (If Applicable).
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
## 2019-11-09 ENCOUNTER — Encounter: Payer: Self-pay | Admitting: Internal Medicine

## 2020-01-09 ENCOUNTER — Encounter: Payer: Medicare Other | Admitting: Internal Medicine

## 2020-02-28 ENCOUNTER — Telehealth: Payer: Self-pay | Admitting: Family Medicine

## 2020-02-28 NOTE — Telephone Encounter (Signed)
Called patient to set up AWV, if patient calls back please assist in scheduling this, any question please ask. Thanks

## 2020-05-09 DIAGNOSIS — J449 Chronic obstructive pulmonary disease, unspecified: Secondary | ICD-10-CM

## 2020-05-09 HISTORY — DX: Chronic obstructive pulmonary disease, unspecified: J44.9

## 2020-05-23 NOTE — Progress Notes (Signed)
Patient ID: Wyatt Sandoval                 DOB: [birth year]                      MRN: [id number]       Cardiology Office Note   Date:  05/24/2020   ID:  Wyatt Sandoval, DOB [birth year], MRN [id number]  PCP:  Eulis Foster, MD  Cardiologist:   Dorris Carnes, MD    Pt presents for f/u of HTN     HPI: Wyatt Sandoval is a 60 y.o. male with history of  DM, HTN, HLD,  CV dz (s/p left carotid endarterectomy).  He presented to clinic in past with complaints of dizziness / orthostatic symtpoms   Found to be severly orthostatic in past  Meds changed; ACE I stopped      Pt had a cardiac cath in 2017 which showed minimal CAD   I saw the pt in clinic in 2019   HE was seen by B Bhagat in 2020 as a televisit  The pt denies CP  Breathing is fair  He still smokes about 1 ppd     Had labs just drawn at [place] office BP has been very high   Says he wasn't taking meds right   Current Meds  Medication Sig  . amitriptyline (ELAVIL) 150 MG tablet TAKE 1 TABLET(150 MG) BY MOUTH AT BEDTIME  . enalapril (VASOTEC) 2.5 MG tablet Take 1 tablet (2.5 mg total) by mouth daily.  Marland Kitchen gabapentin (NEURONTIN) 300 MG capsule Take 1 capsule (300 mg total) by mouth daily.  . metFORMIN (GLUCOPHAGE) 1000 MG tablet Take one tablet by mouth twice daily  . metoprolol tartrate (LOPRESSOR) 25 MG tablet Take 1 tablet (25 mg total) by mouth 2 (two) times daily.  . nitroGLYCERIN (NITROSTAT) 0.4 MG SL tablet ONE TABLET UNDER TONGUE AS NEEDED FOR CHEST PAIN EVERY 5 MINUTES. TAKE UP TO 3 DOSES FOR CHEST PAIN  . omeprazole (PRILOSEC) 40 MG capsule Take 1 capsule (40 mg total) by mouth daily.  Marland Kitchen oxyCODONE (ROXICODONE) 15 MG immediate release tablet Take 1 tablet (15 mg total) by mouth every 4 (four) hours as needed for pain.  . simvastatin (ZOCOR) 40 MG tablet TAKE 1 TABLET(40 MG) BY MOUTH EVERY EVENING  . traZODone (DESYREL) 100 MG tablet Take 100 mg by mouth at bedtime.  . triamcinolone cream (KENALOG) 0.5 % Apply 1  application topically 2 (two) times daily.     Allergies:   Bupropion   Past Medical History:  Diagnosis Date  . Chronic lower back pain    stemming from multiple work accidents in mid 1990s  -- has been followed by pain clinic since then  . Daily headache   . Hepatitis C   . Hyperlipidemia   . Hypertension   . Left-sided carotid artery disease ([place])   . Type II diabetes mellitus ([place])   . Wears glasses     Past Surgical History:  Procedure Laterality Date  . ANTERIOR CERVICAL DECOMP/DISCECTOMY FUSION  2001; 2002  . BACK SURGERY    . CARDIAC CATHETERIZATION N/A 08/26/2015   Procedure: Left Heart Cath and Coronary Angiography;  Surgeon: Jettie Booze, MD;  Location: [facility];  Service: Cardiovascular;  Laterality: N/A;  . CAROTID ENDARTERECTOMY Left 11/07/2015  . CARPAL TUNNEL RELEASE Right ~ 2015  . COLONOSCOPY    . ENDARTERECTOMY Left 11/07/2015   Procedure: LEFT CAROTID ENDARTERECTOMY ;  Surgeon: Serafina Mitchell, MD;  Location: [facility];  Service: Vascular;  Laterality: Left;     Social History:  The patient  reports that he quit smoking about 2 years ago. His smoking use included cigarettes. He started smoking about 41 years ago. He has a 18.50 pack-year smoking history. He has never used smokeless tobacco. He reports current alcohol use. He reports that he does not use drugs.   Family History:  The patient's family history includes Heart attack in his maternal grandfather and maternal grandmother; Heart disease in his sister; Hypertension in his father and mother; Liver cancer in his brother.    ROS:  Please see the history of present illness. All other systems are reviewed and  Negative to the above problem except as noted.    PHYSICAL EXAM: VS:  BP (!) 154/96   Pulse 75   Ht 5\' 7"  (1.702 m)   Wt 196 lb (88.9 kg)   SpO2 97%   BMI 30.70 kg/m   GEN: Overweight 60 yo  in no acute distress  HEENT: normal  Neck: JVP is normal  No carotid bruits, Cardiac:  RRR; no murmurs  No LE edema  Respiratory:  clear to auscultation bilaterally, normal work of breathing GI: soft, nontender, nondistended, + BS  No hepatomegaly  MS: no deformity Moving all extremities   Skin: warm and dry, no rash Neuro:  Strength and sensation are intact Psych: euthymic mood, full affect   EKG:  EKG is ordered today.  SR 89 bpm  LVH with repol abnormalitiy     Lipid Panel    Component Value Date/Time   CHOL 150 12/24/2017 1159   TRIG 65 12/24/2017 1159   HDL 63 12/24/2017 1159   CHOLHDL 2.4 12/24/2017 1159   CHOLHDL 2.8 10/25/2015 1049   VLDL 23 10/25/2015 1049   LDLCALC 74 12/24/2017 1159      Wt Readings from Last 3 Encounters:  05/24/20 196 lb (88.9 kg)  11/28/18 185 lb (83.9 kg)  02/01/18 194 lb (88 kg)      ASSESSMENT AND PLAN:  1   HTN  BP is not optimally controlled   Will get repeat BP when comes in for carotid USN    Will also get records (notes, labs) from [employer])  No changes for now   2   CV dz   S/p CEA  Will set up for USN of carotids to reeval  3   Lipids   Will get labs from PCP   4   DM   On oral agents   He says A1C prettyg good  Get labs   5  Tob  Stressed importance of quitting   Will Rx Chantix   Current medicines are reviewed at length with the patient today.  The patient does not have concerns regarding medicines.  Signed, Dorris Carnes, MD  05/24/2020 10:40 AM    [address], [place], [place]  [address] Phone: [phone]; Fax: [phone]

## 2020-05-24 ENCOUNTER — Encounter: Payer: Self-pay | Admitting: Internal Medicine

## 2020-05-24 ENCOUNTER — Other Ambulatory Visit: Payer: Self-pay

## 2020-05-24 ENCOUNTER — Ambulatory Visit: Payer: Medicare (Managed Care) | Admitting: Internal Medicine

## 2020-05-24 ENCOUNTER — Telehealth: Payer: Self-pay | Admitting: Internal Medicine

## 2020-05-24 VITALS — BP 154/96 | HR 75 | Ht 67.0 in | Wt 196.0 lb

## 2020-05-24 DIAGNOSIS — I6523 Occlusion and stenosis of bilateral carotid arteries: Secondary | ICD-10-CM

## 2020-05-24 MED ORDER — VARENICLINE TARTRATE 1 MG PO TABS: 1.0000 mg | ORAL_TABLET | Freq: Two times a day (BID) | ORAL | 2 refills | Status: DC

## 2020-05-24 MED ORDER — CHANTIX STARTING MONTH PAK 0.5 MG X 11 & 1 MG X 42 PO TABS: ORAL_TABLET | ORAL | 0 refills | Status: DC

## 2020-05-24 NOTE — Telephone Encounter (Signed)
Patient was calling to talk with Dr. Harrington Challenger or nurse about appt today and has a question in regards to medication prescribed

## 2020-05-24 NOTE — Telephone Encounter (Signed)
I spoke with the patient.  He said [facility] does not have Chantix and they don't know when they are getting it in.  I adv he could call around to other pharmacies and if they have it we can send new prescriptions there.

## 2020-05-24 NOTE — Patient Instructions (Addendum)
Medication Instructions:  No changes today. 1.) chantix - use as directed *If you need a refill on your cardiac medications before your next appointment, please call your pharmacy*   Lab Work: none If you have labs (blood work) drawn today and your tests are completely normal, you will receive your results only by:  [place] (if you have MyChart) OR  A paper copy in the mail If you have any lab test that is abnormal or we need to change your treatment, we will call you to review the results.   Testing/Procedures: Your physician has requested that you have a carotid duplex. This test is an ultrasound of the carotid arteries in your neck. It looks at blood flow through these arteries that supply the brain with blood. Allow one hour for this exam. There are no restrictions or special instructions.   Follow-Up: Follow up with your physician will depend on test results.   Other Instructions We will request visit notes and lab results from Genevieve Norlander, NP.   Addendum: contacted patient's PCP, labs and office visit note will be faxed to [phone].

## 2020-06-04 ENCOUNTER — Ambulatory Visit (HOSPITAL_COMMUNITY): Payer: Medicare (Managed Care)

## 2020-06-07 ENCOUNTER — Telehealth: Payer: Self-pay | Admitting: Internal Medicine

## 2020-06-07 DIAGNOSIS — I6523 Occlusion and stenosis of bilateral carotid arteries: Secondary | ICD-10-CM

## 2020-06-07 NOTE — Telephone Encounter (Signed)
New Message:   Pt called and said he is unable to get his Carotid Ulltrasound did here, because of his insurance. He would like for Dr Harrington Challenger to refer him to [facility] in [facility] please. He says his insurance will pay for him to have it there,

## 2020-06-09 ENCOUNTER — Telehealth: Payer: Self-pay | Admitting: Internal Medicine

## 2020-06-09 DIAGNOSIS — E785 Hyperlipidemia, unspecified: Secondary | ICD-10-CM

## 2020-06-09 NOTE — Telephone Encounter (Signed)
Lipids received from PCP    LDL 77   HDL 47  Trig 115 (05/09/20) LDL should be lower  I would reomm stopping simvistatin Start Crestor 20 mg    CHeck lipids and AST in 8 wks.

## 2020-06-10 MED ORDER — ROSUVASTATIN CALCIUM 20 MG PO TABS: 20.0000 mg | ORAL_TABLET | Freq: Every day | ORAL | 3 refills | Status: DC

## 2020-06-10 NOTE — Addendum Note (Signed)
Addended by: Rodman Key on: 06/10/2020 09:33 AM   Modules accepted: Orders

## 2020-06-10 NOTE — Telephone Encounter (Signed)
Spoke with patient and reviewed recommendations.  He will stop simvastatin and begin rosuvastatin. Will return on 08/06/20 for lipids/ast.

## 2020-06-26 NOTE — Telephone Encounter (Signed)
Reviewed with [facility], the vascular department at [facility] needs a written order faxed and states whether it is routine or stat. Fax [phone]   Order placed.  [facility] will fax information and then get patient scheduled.

## 2020-08-06 ENCOUNTER — Other Ambulatory Visit: Payer: Self-pay

## 2020-08-06 ENCOUNTER — Other Ambulatory Visit: Payer: Medicare (Managed Care)

## 2020-08-06 DIAGNOSIS — E785 Hyperlipidemia, unspecified: Secondary | ICD-10-CM

## 2020-08-06 LAB — LIPID PANEL
Chol/HDL Ratio: 2.6 ratio (ref 0.0–5.0)
Cholesterol, Total: 143 mg/dL (ref 100–199)
HDL: 54 mg/dL (ref >39)
LDL Chol Calc (NIH): 66 mg/dL (ref 0–99)
Triglycerides: 133 mg/dL (ref 0–149)
VLDL Cholesterol Cal: 23 mg/dL (ref 5–40)

## 2020-08-06 LAB — AST: AST: 25 IU/L (ref 0–40)

## 2020-08-09 DIAGNOSIS — E1140 Type 2 diabetes mellitus with diabetic neuropathy, unspecified: Secondary | ICD-10-CM | POA: Insufficient documentation

## 2020-08-09 HISTORY — DX: Type 2 diabetes mellitus with diabetic neuropathy, unspecified: E11.40

## 2020-10-16 ENCOUNTER — Other Ambulatory Visit: Payer: Self-pay | Admitting: Internal Medicine

## 2020-10-16 NOTE — Telephone Encounter (Signed)
Pt's pharmacy is requesting a refill on varenicline. Would Dr. Ross like to refill this medication? Please address 

## 2021-03-04 ENCOUNTER — Other Ambulatory Visit: Payer: Self-pay

## 2021-03-04 NOTE — Telephone Encounter (Signed)
Received a call from the patient requesting a refill on Nitroglycerin and Crestor.  He wants to know if needs to schedule a follow up appointment.   Is this medication ok to refill?

## 2021-03-05 MED ORDER — ROSUVASTATIN CALCIUM 20 MG PO TABS: 20.0000 mg | ORAL_TABLET | Freq: Every day | ORAL | 1 refills | Status: DC

## 2021-03-05 MED ORDER — NITROGLYCERIN 0.4 MG SL SUBL: SUBLINGUAL_TABLET | SUBLINGUAL | 1 refills | Status: DC

## 2021-03-05 NOTE — Telephone Encounter (Signed)
Please refill meds and advise him to make a follow-up appt with Dr. Harrington Challenger at next available.  Thanks!

## 2021-03-05 NOTE — Telephone Encounter (Signed)
Spoke with patient and scheduled a follow up appointment with Dr Harrington Challenger in March. Patient agreeable with appointment.   Rx(s) sent to pharmacy electronically.

## 2021-03-26 ENCOUNTER — Other Ambulatory Visit: Payer: Self-pay | Admitting: Internal Medicine

## 2021-07-07 ENCOUNTER — Encounter: Payer: Self-pay | Admitting: Internal Medicine

## 2021-07-07 ENCOUNTER — Ambulatory Visit: Payer: Medicare (Managed Care) | Admitting: Internal Medicine

## 2021-07-07 ENCOUNTER — Other Ambulatory Visit: Payer: Self-pay

## 2021-07-07 VITALS — BP 158/70 | HR 84 | Ht 67.0 in | Wt 201.2 lb

## 2021-07-07 DIAGNOSIS — I6523 Occlusion and stenosis of bilateral carotid arteries: Secondary | ICD-10-CM

## 2021-07-07 DIAGNOSIS — Z79899 Other long term (current) drug therapy: Secondary | ICD-10-CM

## 2021-07-07 DIAGNOSIS — E785 Hyperlipidemia, unspecified: Secondary | ICD-10-CM | POA: Diagnosis not present

## 2021-07-07 MED ORDER — VARENICLINE TARTRATE 0.5 MG PO TABS: 0.5000 mg | ORAL_TABLET | Freq: Two times a day (BID) | ORAL | 1 refills | Status: DC

## 2021-07-07 MED ORDER — ENALAPRIL MALEATE 5 MG PO TABS: 5.0000 mg | ORAL_TABLET | Freq: Every day | ORAL | 3 refills | Status: DC

## 2021-07-07 NOTE — Progress Notes (Signed)
? ?  ? ?Cardiology Office Note ? ? ?Date:  07/07/2021  ? ?ID:  Wyatt Sandoval, DOB 23-Jun-1960, MRN [id number] ? ?PCP:  Genevieve Norlander, PA  ?Cardiologist:   Dorris Carnes, MD  ? ? ?Pt presents for f/u of HTN   ? ? ?HPI: ?Wyatt Sandoval is a 61 y.o. male with history of  DM, HTN, HLD,  CV dz (s/p left carotid endarterectomy).  ?He presented to clinic in past with complaints of dizziness / orthostatic symtpoms   Found to be severly orthostatic in past  Meds changed; ACE I stopped     ? Pt had a cardiac cath in 2017 which showed minimal CAD   ?I saw the pt in clinic in Jan 2022     ? ?Pt says his breathing is So/So   Still smoking   2 cigs per day     ?Denies CP   ? ?Diet:    ?Breakfast   Sausage/eggs     ?Lunch   Salad    ?No fast food   ?Couple Mtn Dews  per week ? ?Current Meds  ?Medication Sig  ? amitriptyline (ELAVIL) 150 MG tablet TAKE 1 TABLET(150 MG) BY MOUTH AT BEDTIME  ? enalapril (VASOTEC) 2.5 MG tablet Take 1 tablet (2.5 mg total) by mouth daily.  ? gabapentin (NEURONTIN) 300 MG capsule Take 1 capsule (300 mg total) by mouth daily.  ? hydrOXYzine (ATARAX) 10 MG tablet hydroxyzine HCl 10 mg tablet ? TAKE 1 TABLET BY MOUTH EVERY DAY  ? metFORMIN (GLUCOPHAGE) 1000 MG tablet Take one tablet by mouth twice daily  ? metoprolol tartrate (LOPRESSOR) 25 MG tablet Take 1 tablet (25 mg total) by mouth 2 (two) times daily.  ? nitroGLYCERIN (NITROSTAT) 0.4 MG SL tablet DISSOLVE 1 TABLET UNDER THE TONGUE EVERY 5 MINUTES AS NEEDED FOR CHEST PAIN. TAKE UP TO 3 DOSES AS DIRECTED  ? omeprazole (PRILOSEC) 40 MG capsule Take 1 capsule (40 mg total) by mouth daily.  ? oxyCODONE (ROXICODONE) 15 MG immediate release tablet Take 1 tablet (15 mg total) by mouth every 4 (four) hours as needed for pain.  ? rosuvastatin (CRESTOR) 20 MG tablet Take 1 tablet (20 mg total) by mouth daily.  ? traZODone (DESYREL) 100 MG tablet Take 100 mg by mouth at bedtime.  ? triamcinolone cream (KENALOG) 0.5 % Apply 1 application topically 2 (two) times  daily.  ? ? ? ?Allergies:   Bupropion  ? ?Past Medical History:  ?Diagnosis Date  ? Chronic lower back pain   ? stemming from multiple work accidents in mid 1990s  -- has been followed by pain clinic since then  ? Daily headache   ? Hepatitis C   ? Hyperlipidemia   ? Hypertension   ? Left-sided carotid artery disease ([place])   ? Type II diabetes mellitus ([place])   ? Wears glasses   ? ? ?Past Surgical History:  ?Procedure Laterality Date  ? ANTERIOR CERVICAL DECOMP/DISCECTOMY FUSION  2001; 2002  ? BACK SURGERY    ? CARDIAC CATHETERIZATION N/A 08/26/2015  ? Procedure: Left Heart Cath and Coronary Angiography;  Surgeon: Jettie Booze, MD;  Location: [place] [facility];  Service: Cardiovascular;  Laterality: N/A;  ? CAROTID ENDARTERECTOMY Left 11/07/2015  ? CARPAL TUNNEL RELEASE Right ~ 2015  ? COLONOSCOPY    ? ENDARTERECTOMY Left 11/07/2015  ? Procedure: LEFT CAROTID ENDARTERECTOMY ;  Surgeon: Serafina Mitchell, MD;  Location: [facility];  Service: Vascular;  Laterality: Left;  ? ? ? ?  Social History:  The patient  reports that he quit smoking about 4 years ago. His smoking use included cigarettes. He started smoking about 42 years ago. He has a 18.50 pack-year smoking history. He has never used smokeless tobacco. He reports current alcohol use. He reports that he does not use drugs.  ? ?Family History:  The patient's family history includes Heart attack in his maternal grandfather and maternal grandmother; Heart disease in his sister; Hypertension in his father and mother; Liver cancer in his brother.  ? ? ?ROS:  Please see the history of present illness. All other systems are reviewed and  Negative to the above problem except as noted.  ? ? ?PHYSICAL EXAM: ?VS:  BP (!) 158/70 (BP Location: Left Arm, Patient Position: Sitting, Cuff Size: Normal)   Pulse 84   Ht '5\' 7"'$  (1.702 m)   Wt 201 lb 3.2 oz (91.3 kg)   SpO2 96%   BMI 31.51 kg/m?   ?GEN: Obese  61 yo  in no acute distress  ?HEENT: normal  ?Neck: JVP is normal  No  carotid bruits, ?Cardiac: RRR; no murmurs  No LE edema  ?Respiratory:  clear to auscultation bilaterally, ?GI: soft, nontender, nondistended, + BS  No hepatomegaly  ?MS: no deformity Moving all extremities   ?Skin: warm and dry, no rash ?Neuro:  Strength and sensation are intact ?Psych: euthymic mood, full affect ? ? ?EKG:  EKG is ordered today.  SR 89 bpm  LVH with repol abnormalitiy   ? ? ?Lipid Panel ?   ?Component Value Date/Time  ? CHOL 143 08/06/2020 0738  ? TRIG 133 08/06/2020 0738  ? HDL 54 08/06/2020 0738  ? CHOLHDL 2.6 08/06/2020 0738  ? CHOLHDL 2.8 10/25/2015 1049  ? VLDL 23 10/25/2015 1049  ? [place] 66 08/06/2020 0738  ? ?  ? ?Wt Readings from Last 3 Encounters:  ?07/07/21 201 lb 3.2 oz (91.3 kg)  ?05/24/20 196 lb (88.9 kg)  ?11/28/18 185 lb (83.9 kg)  ?  ? ? ?ASSESSMENT AND PLAN: ? ?1   HTN  BP is not optimally controlled   Will increase enalopril to 5 mg    Shed follow up in HTN clinic for med titration.   Pt has been seen erratically and not optimized.    ? ?2   CV dz   S/p CEA  Will set up for USN of carotids to reevaluate  ? ?3   Lipids   Will get labs from  Mortimer Fries (PCP) ? ?4   DM   On oral agents   He says A1C pretty good  If none recent will repeat  ? ?5  Tob  Patient says he is down to one cigarette  Will Rx  Chantix   ? ?Current medicines are reviewed at length with the patient today.  The patient does not have concerns regarding medicines. ? ?Signed, ?Dorris Carnes, MD  ?07/07/2021 9:26 AM    ?[facility] ?[address], [place], [place]  [address] ?Phone: [phone]; Fax: [phone]  ? ? ?

## 2021-07-07 NOTE — Patient Instructions (Addendum)
Medication Instructions:  ?Increase Enalapril to 5 mg daily  ?Chantix sent to your Pharmacy  ?*If you need a refill on your cardiac medications before your next appointment, please call your pharmacy* ? ? ?Lab Work: ?none ?If you have labs (blood work) drawn today and your tests are completely normal, you will receive your results only by: ?MyChart Message (if you have MyChart) OR ?A paper copy in the mail ?If you have any lab test that is abnormal or we need to change your treatment, we will call you to review the results. ? ? ?Testing/Procedures: ?Your physician has requested that you have a carotid duplex. This test is an ultrasound of the carotid arteries in your neck. It looks at blood flow through these arteries that supply the brain with blood. Allow one hour for this exam. There are no restrictions or special instructions. ? ? ? ?Follow-Up: ?At [facility], you and your health needs are our priority.  As part of our continuing mission to provide you with exceptional heart care, we have created designated Provider Care Teams.  These Care Teams include your primary Cardiologist (physician) and Advanced Practice Providers (APPs -  Physician Assistants and Nurse Practitioners) who all work together to provide you with the care you need, when you need it. ? ?We recommend signing up for the patient portal called "MyChart".  Sign up information is provided on this After Visit Summary.  MyChart is used to connect with patients for Virtual Visits (Telemedicine).  Patients are able to view lab/test results, encounter notes, upcoming appointments, etc.  Non-urgent messages can be sent to your provider as well.   ?To learn more about what you can do with MyChart, go to [email or web address].   ? ?Your next appointment:   ?1 year(s) ? ?The format for your next appointment:   ?In Person ? ?Provider:   ?Dorris Carnes, MD   ? ? ?Other Instructions ? ? Referral to HTN Clinic in 4-6 weeks  ?

## 2021-07-29 ENCOUNTER — Other Ambulatory Visit: Payer: Self-pay

## 2021-07-29 ENCOUNTER — Other Ambulatory Visit: Payer: Self-pay | Admitting: Internal Medicine

## 2021-07-29 ENCOUNTER — Ambulatory Visit (HOSPITAL_COMMUNITY)
Admission: RE | Admit: 2021-07-29 | Discharge: 2021-07-29 | Disposition: A | Discharge status: Home / Self Care | Payer: Medicare (Managed Care) | Source: Ambulatory Visit | Attending: Cardiology | Admitting: Cardiology

## 2021-07-29 DIAGNOSIS — I6523 Occlusion and stenosis of bilateral carotid arteries: Secondary | ICD-10-CM | POA: Insufficient documentation

## 2021-07-29 DIAGNOSIS — Z79899 Other long term (current) drug therapy: Secondary | ICD-10-CM

## 2021-07-29 DIAGNOSIS — E785 Hyperlipidemia, unspecified: Secondary | ICD-10-CM | POA: Insufficient documentation

## 2021-08-11 NOTE — Progress Notes (Signed)
Patient ID: Wyatt Sandoval                 DOB: Mar 13, 1961                      MRN: [id number] ? ? ? ? ?HPI: ?Wyatt Sandoval is a 61 y.o. male referred by Dr. Harrington Challenger to HTN clinic. PMH is significant for HTN, HLD, CAD s/p left carotid endarterectomy, and T2DM. Cardiac cath in 2017 showed minimal CAD. Has had a history of dizziness/orthostatic symptoms and ACEi was stopped. At last visit with Dr. Harrington Challenger on 07/07/21, BP was 158/70 and enalapril dose was increased.  ? ?Today, patient arrives in good spirits. Endorses adherence with enalapril '5mg'$  daily with no missed doses. He has taken it this morning. Denies dizziness, headache, blurred vision, swelling. Reports that he is still smoking 1 PPD. He is taking Chantix 0.5 mg BID which Dr. Harrington Challenger prescribed at his last visit with no adverse effects but hasn't noticed a big impact on his cravings.  ? ?Current HTN meds: enalapril 5 mg daily, metoprolol tartrate 25 mg BID ?Previously tried: none ?BP goal: <130/80 mmHg ? ?Family History: The patient's family history includes Heart attack in his maternal grandfather and maternal grandmother; Heart disease in his sister; Hypertension in his father and mother; Liver cancer in his brother.  ? ?Social History: Current smokes 1 PPD. Currently trying to quit.  ? ?Diet: Eats 2 meals/day. Reports eating a healthy diet. Endorses low sodium diet. Minimal caffeine intake.  ? ?Exercise: No formal exercise. Works part time Tax inspector days per week.  ? ?Home BP readings: BP has improved since last visit; uses upper arm cuff which has not been validated before, reports home readings 130s/80s ? ?Labs:  ?08/12/21: BMET pending (enalapril 5 mg) ?05/27/21: Scr 1.09, K 4.7 (enalapril 2.5 mg) ? ?Wt Readings from Last 3 Encounters:  ?07/07/21 201 lb 3.2 oz (91.3 kg)  ?05/24/20 196 lb (88.9 kg)  ?11/28/18 185 lb (83.9 kg)  ? ?BP Readings from Last 3 Encounters:  ?07/07/21 (!) 158/70  ?05/24/20 (!) 154/96  ?02/01/18 140/80  ? ?Pulse Readings from Last 3  Encounters:  ?07/07/21 84  ?05/24/20 75  ?02/01/18 69  ? ? ?Renal function: ?CrCl cannot be calculated (Patient's most recent lab result is older than the maximum 21 days allowed.). ? ?Past Medical History:  ?Diagnosis Date  ? Chronic lower back pain   ? stemming from multiple work accidents in mid 1990s  -- has been followed by pain clinic since then  ? Daily headache   ? Hepatitis C   ? Hyperlipidemia   ? Hypertension   ? Left-sided carotid artery disease ([place])   ? Type II diabetes mellitus ([place])   ? Wears glasses   ? ? ?Current Outpatient Medications on File Prior to Visit  ?Medication Sig Dispense Refill  ? albuterol (VENTOLIN HFA) 108 (90 Base) MCG/ACT inhaler 2 puffs every 4 (four) hours as needed for wheezing or shortness of breath.    ? amitriptyline (ELAVIL) 150 MG tablet TAKE 1 TABLET(150 MG) BY MOUTH AT BEDTIME 90 tablet 0  ? enalapril (VASOTEC) 5 MG tablet Take 1 tablet (5 mg total) by mouth daily. 90 tablet 3  ? gabapentin (NEURONTIN) 300 MG capsule Take 1 capsule (300 mg total) by mouth daily. 30 capsule 3  ? hydrOXYzine (ATARAX) 10 MG tablet hydroxyzine HCl 10 mg tablet ? TAKE 1 TABLET BY MOUTH EVERY DAY    ? metFORMIN (GLUCOPHAGE)  1000 MG tablet Take one tablet by mouth twice daily 180 tablet 0  ? metoprolol tartrate (LOPRESSOR) 25 MG tablet Take 1 tablet (25 mg total) by mouth 2 (two) times daily. 180 tablet 3  ? nitroGLYCERIN (NITROSTAT) 0.4 MG SL tablet DISSOLVE 1 TABLET UNDER THE TONGUE EVERY 5 MINUTES AS NEEDED FOR CHEST PAIN. TAKE UP TO 3 DOSES AS DIRECTED 25 tablet 3  ? omeprazole (PRILOSEC) 40 MG capsule Take 1 capsule (40 mg total) by mouth daily. 90 capsule 3  ? oxyCODONE (ROXICODONE) 15 MG immediate release tablet Take 1 tablet (15 mg total) by mouth every 4 (four) hours as needed for pain. 30 tablet 0  ? rosuvastatin (CRESTOR) 20 MG tablet Take 1 tablet (20 mg total) by mouth daily. 90 tablet 1  ? traZODone (DESYREL) 100 MG tablet Take 100 mg by mouth at bedtime.    ? triamcinolone cream  (KENALOG) 0.5 % Apply 1 application topically 2 (two) times daily. 30 g 0  ? varenicline (CHANTIX) 0.5 MG tablet Take 1 tablet (0.5 mg total) by mouth 2 (two) times daily. 60 tablet 1  ? ?No current facility-administered medications on file prior to visit.  ? ? ?Allergies  ?Allergen Reactions  ? Bupropion Other (See Comments)  ?  Memory and thinking impairment - dysphoria ?Memory and thinking impairment - dysphoria  ? ? ? ?Assessment/Plan: ? ?1. Hypertension - BP is now at goal <130/80 mmHg after increasing enalapril at his last visit. Continue enalapril 5 mg daily and metoprolol tartrate 25 mg BID. Will check BMET today with recent ACEi dose increase. Encouraged continued low sodium diet and increasing physical activity. Continue checking BP at home and bring to next visit to have it validated. Since BP is at goal, follow up with HTN clinic as needed.  ? ?2. Smoking Cessation - Patient currently smokes 1 PPD and is trying to quit. Currently using Chantix 0.5 mg BID with no adverse effects but he has not seen much improvement with it so far. Will increase Chantix to 1 mg BID with meals. Counseled to take with food to prevent GI adverse effects. Encouraged him to continue working toward cessation.  ? ?Rebbeca Paul, PharmD ?PGY2 Ambulatory Care Pharmacy Resident ?08/12/2021 11:11 AM ? ?

## 2021-08-12 ENCOUNTER — Ambulatory Visit: Payer: Medicare (Managed Care) | Admitting: Student-PharmD

## 2021-08-12 VITALS — BP 120/78 | HR 80

## 2021-08-12 DIAGNOSIS — F17200 Nicotine dependence, unspecified, uncomplicated: Secondary | ICD-10-CM

## 2021-08-12 DIAGNOSIS — I10 Essential (primary) hypertension: Secondary | ICD-10-CM | POA: Diagnosis not present

## 2021-08-12 LAB — BASIC METABOLIC PANEL
BUN/Creatinine Ratio: 12 (ref 9–20)
BUN: 13 mg/dL (ref 6–24)
CO2: 23 mmol/L (ref 20–29)
Calcium: 10.1 mg/dL (ref 8.7–10.2)
Chloride: 102 mmol/L (ref 96–106)
Creatinine, Ser: 1.05 mg/dL (ref 0.76–1.27)
Glucose: 195 mg/dL — ABNORMAL HIGH (ref 70–99)
Potassium: 4.2 mmol/L (ref 3.5–5.2)
Sodium: 140 mmol/L (ref 134–144)
eGFR: 82 mL/min/{1.73_m2} (ref >59)

## 2021-08-12 MED ORDER — VARENICLINE TARTRATE 1 MG PO TABS: 1.0000 mg | ORAL_TABLET | Freq: Two times a day (BID) | ORAL | 0 refills | Status: DC

## 2021-08-12 NOTE — Patient Instructions (Signed)
It was nice to see you today! ? ?Your goal blood pressure is less than 130/80 mmHg. In clinic, your blood pressure was 120/78 mmHg. ? ?Medication Changes: ?Continue enalapril 5 mg daily.  ? ?We will call you if your labs from today need attention.  ? ?Monitor blood pressure at home daily and keep a log (on your phone or piece of paper) to bring with you to your next visit. Write down date, time, blood pressure and pulse. ? ?Keep up the good work with diet and exercise. Aim for a diet full of vegetables, fruit and lean meats (chicken, [place], fish). Try to limit salt intake by eating fresh or frozen vegetables (instead of canned), rinse canned vegetables prior to cooking and do not add any additional salt to meals.  ? ? ? ? ? ?

## 2021-09-07 ENCOUNTER — Other Ambulatory Visit: Payer: Self-pay | Admitting: Internal Medicine

## 2021-12-05 ENCOUNTER — Telehealth: Payer: Self-pay | Admitting: Internal Medicine

## 2021-12-05 NOTE — Telephone Encounter (Signed)
Spoke with pt who reports increased fatigue and dizziness upon getting out of the car after a lengthy ride of 30 minutes x 3 months.  Pt states it only happens getting out of the car and not after sitting in a chair anywhere else.  He states it is not a spinning sensation but more of loss of balance.  He does not check his BP regularly.  He states his blood sugar is under control.  He has been taking medications as prescribed. He denies current CP, SOB, edema or dizziness. Requested pt check his BP and HR daily 2 hours after taking BP medications and call with readings on Monday.  Will forward information to Dr Harrington Challenger and will await BP readings.  Reviewed ED precautions.  Pt verbalizes understanding and agrees with current plan.

## 2021-12-05 NOTE — Telephone Encounter (Signed)
Pt c/o Shortness Of Breath: STAT if SOB developed within the last 24 hours or pt is noticeably SOB on the phone  1. Are you currently SOB (can you hear that pt is SOB on the phone)? No  2. How long have you been experiencing SOB?   For 3-4 months  3. Are you SOB when sitting or when up moving around?   When he sits for about 20-30 minutes while riding in his car  4. Are you currently experiencing any other symptoms?   Dizziness   Patient called stating he is feeling tired and dizziness when getting ready to stand.

## 2021-12-05 NOTE — Telephone Encounter (Signed)
Spoke to pt    Reviewed    He says his bp has been running 150/  Different from 120/ when seen in HTN clinic  He says he only gets dizzy when getting out of car after 30 min ride  Also complains of pain behind R knee when driving only   Does not have pain when getting out of car   No swelling      I agree with plans to take bp, even in car and when getting out if able  Call next week with results   Really needs to come in to clinic with cuff in the near future

## 2021-12-31 ENCOUNTER — Other Ambulatory Visit: Payer: Self-pay | Admitting: Internal Medicine

## 2021-12-31 NOTE — Telephone Encounter (Signed)
Pt's pharmacy is requesting a refill on varenicline. Would Dr. Harrington Challenger like to refill this medication? Please address

## 2022-01-06 ENCOUNTER — Other Ambulatory Visit: Payer: Self-pay

## 2022-01-06 ENCOUNTER — Encounter: Payer: Self-pay | Admitting: Internal Medicine

## 2022-01-06 ENCOUNTER — Ambulatory Visit: Payer: Medicare HMO | Attending: Internal Medicine | Admitting: Internal Medicine

## 2022-01-06 VITALS — BP 114/68 | HR 94 | Ht 67.0 in | Wt 196.8 lb

## 2022-01-06 DIAGNOSIS — E785 Hyperlipidemia, unspecified: Secondary | ICD-10-CM | POA: Diagnosis not present

## 2022-01-06 DIAGNOSIS — I10 Essential (primary) hypertension: Secondary | ICD-10-CM

## 2022-01-06 DIAGNOSIS — Z79899 Other long term (current) drug therapy: Secondary | ICD-10-CM | POA: Diagnosis not present

## 2022-01-06 DIAGNOSIS — I6523 Occlusion and stenosis of bilateral carotid arteries: Secondary | ICD-10-CM | POA: Diagnosis not present

## 2022-01-06 MED ORDER — CLONIDINE HCL 0.1 MG PO TABS: ORAL_TABLET | ORAL | 0 refills | Status: DC

## 2022-01-06 MED ORDER — VARENICLINE TARTRATE 1 MG PO TABS: ORAL_TABLET | ORAL | 0 refills | Status: DC

## 2022-01-06 NOTE — Telephone Encounter (Signed)
Pt reports continued elevated BP and dizziness and will come in today with his cuff to see Dr Harrington Challenger at 3:20 pm.
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
## 2022-01-06 NOTE — Patient Instructions (Signed)
Medication Instructions:  Clonidine 0.1 mg to take as needed for BP higher than 165/95.  *If you need a refill on your cardiac medications before your next appointment, please call your pharmacy*   Lab Work: CBC, BMET, TSH, PSA, LIPID If you have labs (blood work) drawn today and your tests are completely normal, you will receive your results only by: [place] (if you have MyChart) OR A paper copy in the mail If you have any lab test that is abnormal or we need to change your treatment, we will call you to review the results.   Testing/Procedures:  Your physician has requested that you have a renal artery duplex. During this test, an ultrasound is used to evaluate blood flow to the kidneys. Allow one hour for this exam. Do not eat after midnight the day before and avoid carbonated beverages. Take your medications as you usually do.    Follow-Up: At [facility], you and your health needs are our priority.  As part of our continuing mission to provide you with exceptional heart care, we have created designated Provider Care Teams.  These Care Teams include your primary Cardiologist (physician) and Advanced Practice Providers (APPs -  Physician Assistants and Nurse Practitioners) who all work together to provide you with the care you need, when you need it.  We recommend signing up for the patient portal called "MyChart".  Sign up information is provided on this After Visit Summary.  MyChart is used to connect with patients for Virtual Visits (Telemedicine).  Patients are able to view lab/test results, encounter notes, upcoming appointments, etc.  Non-urgent messages can be sent to your provider as well.   To learn more about what you can do with MyChart, go to [email or web address].     Important Information About Sugar

## 2022-01-07 LAB — CBC
Hematocrit: 40.9 % (ref 37.5–51.0)
Hemoglobin: 13.3 g/dL (ref 13.0–17.7)
MCH: 26.1 pg — ABNORMAL LOW (ref 26.6–33.0)
MCHC: 32.5 g/dL (ref 31.5–35.7)
MCV: 80 fL (ref 79–97)
Platelets: 274 10*3/uL (ref 150–450)
RBC: 5.1 x10E6/uL (ref 4.14–5.80)
RDW: 14 % (ref 11.6–15.4)
WBC: 6.3 10*3/uL (ref 3.4–10.8)

## 2022-01-07 LAB — LIPID PANEL
Chol/HDL Ratio: 3.1 ratio (ref 0.0–5.0)
Cholesterol, Total: 155 mg/dL (ref 100–199)
HDL: 50 mg/dL (ref >39)
LDL Chol Calc (NIH): 60 mg/dL (ref 0–99)
Triglycerides: 284 mg/dL — ABNORMAL HIGH (ref 0–149)
VLDL Cholesterol Cal: 45 mg/dL — ABNORMAL HIGH (ref 5–40)

## 2022-01-07 LAB — BASIC METABOLIC PANEL
BUN/Creatinine Ratio: 11 (ref 9–20)
BUN: 14 mg/dL (ref 6–24)
CO2: 22 mmol/L (ref 20–29)
Calcium: 10.3 mg/dL — ABNORMAL HIGH (ref 8.7–10.2)
Chloride: 100 mmol/L (ref 96–106)
Creatinine, Ser: 1.25 mg/dL (ref 0.76–1.27)
Glucose: 167 mg/dL — ABNORMAL HIGH (ref 70–99)
Potassium: 4.6 mmol/L (ref 3.5–5.2)
Sodium: 138 mmol/L (ref 134–144)
eGFR: 67 mL/min/{1.73_m2} (ref >59)

## 2022-01-07 LAB — PSA: Prostate Specific Ag, Serum: 0.7 ng/mL (ref 0.0–4.0)

## 2022-01-07 LAB — TSH: TSH: 1.12 u[IU]/mL (ref 0.450–4.500)

## 2022-01-19 ENCOUNTER — Ambulatory Visit (HOSPITAL_COMMUNITY)
Admission: RE | Admit: 2022-01-19 | Discharge: 2022-01-19 | Disposition: A | Discharge status: Home / Self Care | Payer: Medicare HMO | Source: Ambulatory Visit | Attending: Cardiology | Admitting: Cardiology

## 2022-01-19 DIAGNOSIS — I6523 Occlusion and stenosis of bilateral carotid arteries: Secondary | ICD-10-CM

## 2022-01-19 DIAGNOSIS — E785 Hyperlipidemia, unspecified: Secondary | ICD-10-CM

## 2022-01-19 DIAGNOSIS — I10 Essential (primary) hypertension: Secondary | ICD-10-CM

## 2022-01-19 DIAGNOSIS — Z79899 Other long term (current) drug therapy: Secondary | ICD-10-CM

## 2022-01-20 ENCOUNTER — Ambulatory Visit (HOSPITAL_COMMUNITY)
Admission: RE | Admit: 2022-01-20 | Discharge: 2022-01-20 | Disposition: A | Discharge status: Home / Self Care | Payer: Medicare HMO | Source: Ambulatory Visit | Attending: Cardiology | Admitting: Cardiology

## 2022-01-20 DIAGNOSIS — I6523 Occlusion and stenosis of bilateral carotid arteries: Secondary | ICD-10-CM | POA: Diagnosis present

## 2022-01-20 DIAGNOSIS — I10 Essential (primary) hypertension: Secondary | ICD-10-CM

## 2022-01-20 DIAGNOSIS — E785 Hyperlipidemia, unspecified: Secondary | ICD-10-CM | POA: Diagnosis present

## 2022-01-20 DIAGNOSIS — Z79899 Other long term (current) drug therapy: Secondary | ICD-10-CM

## 2022-01-29 LAB — COLOGUARD: COLOGUARD: NEGATIVE

## 2022-02-09 ENCOUNTER — Other Ambulatory Visit: Payer: Self-pay | Admitting: Internal Medicine

## 2022-02-25 ENCOUNTER — Other Ambulatory Visit: Payer: Self-pay | Admitting: Physician Assistant

## 2022-02-25 DIAGNOSIS — R42 Dizziness and giddiness: Secondary | ICD-10-CM

## 2022-03-09 ENCOUNTER — Other Ambulatory Visit: Payer: Self-pay | Admitting: Internal Medicine

## 2022-03-11 ENCOUNTER — Other Ambulatory Visit: Payer: Self-pay | Admitting: Internal Medicine

## 2022-03-12 ENCOUNTER — Other Ambulatory Visit (INDEPENDENT_AMBULATORY_CARE_PROVIDER_SITE_OTHER): Payer: Medicare HMO

## 2022-03-12 ENCOUNTER — Ambulatory Visit: Payer: Medicare HMO | Admitting: Physician Assistant

## 2022-03-12 ENCOUNTER — Encounter: Payer: Self-pay | Admitting: Physician Assistant

## 2022-03-12 VITALS — BP 101/70 | HR 64 | Resp 20 | Ht 67.0 in | Wt 196.0 lb

## 2022-03-12 DIAGNOSIS — G3184 Mild cognitive impairment, so stated: Secondary | ICD-10-CM | POA: Diagnosis not present

## 2022-03-12 DIAGNOSIS — R413 Other amnesia: Secondary | ICD-10-CM | POA: Diagnosis not present

## 2022-03-12 LAB — VITAMIN B12: Vitamin B-12: 443 pg/mL (ref 211–911)

## 2022-03-12 NOTE — Patient Instructions (Addendum)
It was a pleasure to see you today at our office.   Recommendations:  Neurocognitive evaluation  Follow up on Nov 16 at 1 pm  Check labs today Continue donepezil 5 mg daily for memory  Consider psychotherapy for anxiety  Discontinue alcohol    Whom to call:  Memory  decline, memory medications: Call our office [phone]   For psychiatric meds, mood meds: Please have your primary care physician manage these medications.    For guidance in geriatric dementia issues please call [facility] [phone]     If you have any severe symptoms of a stroke, or other severe issues such as confusion,severe chills or fever, etc call 911 or go to the ER as you may need to be evaluated further   Feel free to visit Facebook page " [email or web address]" for tips of how to care for people with memory problems.      RECOMMENDATIONS FOR ALL PATIENTS WITH MEMORY PROBLEMS: 1. Continue to exercise (Recommend 30 minutes of walking everyday, or 3 hours every week) 2. Increase social interactions - continue going to [place] and enjoy social gatherings with friends and family 3. Eat healthy, avoid fried foods and eat more fruits and vegetables 4. Maintain adequate blood pressure, blood sugar, and blood cholesterol level. Reducing the risk of stroke and cardiovascular disease also helps promoting better memory. 5. Avoid stressful situations. Live a simple life and avoid aggravations. Organize your time and prepare for the next day in anticipation. 6. Sleep well, avoid any interruptions of sleep and avoid any distractions in the bedroom that may interfere with adequate sleep quality 7. Avoid sugar, avoid sweets as there is a strong link between excessive sugar intake, diabetes, and cognitive impairment We discussed the Mediterranean diet, which has been shown to help patients reduce the risk of progressive memory disorders and reduces cardiovascular risk. This includes eating fish, eat fruits and green leafy  vegetables, nuts like almonds and hazelnuts, walnuts, and also use olive oil. Avoid fast foods and fried foods as much as possible. Avoid sweets and sugar as sugar use has been linked to worsening of memory function.  There is always a concern of gradual progression of memory problems. If this is the case, then we may need to adjust level of care according to patient needs. Support, both to the patient and caregiver, should then be put into place.        FALL PRECAUTIONS: Be cautious when walking. Scan the area for obstacles that may increase the risk of trips and falls. When getting up in the mornings, sit up at the edge of the bed for a few minutes before getting out of bed. Consider elevating the bed at the head end to avoid drop of blood pressure when getting up. Walk always in a well-lit room (use night lights in the walls). Avoid area rugs or power cords from appliances in the middle of the walkways. Use a walker or a cane if necessary and consider physical therapy for balance exercise. Get your eyesight checked regularly.  FINANCIAL OVERSIGHT: Supervision, especially oversight when making financial decisions or transactions is also recommended.  HOME SAFETY: Consider the safety of the kitchen when operating appliances like stoves, microwave oven, and blender. Consider having supervision and share cooking responsibilities until no longer able to participate in those. Accidents with firearms and other hazards in the house should be identified and addressed as well.   ABILITY TO BE LEFT ALONE: If patient is unable to contact 911 operator,  consider using LifeLine, or when the need is there, arrange for someone to stay with patients. Smoking is a fire hazard, consider supervision or cessation. Risk of wandering should be assessed by caregiver and if detected at any point, supervision and safe proof recommendations should be instituted.  MEDICATION SUPERVISION: Inability to self-administer  medication needs to be constantly addressed. Implement a mechanism to ensure safe administration of the medications.   DRIVING: Regarding driving, in patients with progressive memory problems, driving will be impaired. We advise to have someone else do the driving if trouble finding directions or if minor accidents are reported. Independent driving assessment is available to determine safety of driving.   If you are interested in the driving assessment, you can contact the following:  [employer] in [place]  [place] [phone] or [phone]    [place] refers to food and lifestyle choices that are based on the traditions of countries located on the [employer]. This way of eating has been shown to help prevent certain conditions and improve outcomes for people who have chronic diseases, like kidney disease and heart disease. What are tips for following this plan? Lifestyle  Cook and eat meals together with your family, when possible. Drink enough fluid to keep your urine clear or pale yellow. Be physically active every day. This includes: Aerobic exercise like running or swimming. Leisure activities like gardening, walking, or housework. Get 7-8 hours of sleep each night. If recommended by your health care provider, drink red wine in moderation. This means 1 glass a day for nonpregnant women and 2 glasses a day for men. A glass of wine equals 5 oz (150 mL). Reading food labels  Check the serving size of packaged foods. For foods such as rice and pasta, the serving size refers to the amount of cooked product, not dry. Check the total fat in packaged foods. Avoid foods that have saturated fat or trans fats. Check the ingredients list for added sugars, such as corn syrup. Shopping  At the grocery store, buy most of your food  from the areas near the walls of the store. This includes: Fresh fruits and vegetables (produce). Grains, beans, nuts, and seeds. Some of these may be available in unpackaged forms or large amounts (in bulk). Fresh seafood. Poultry and eggs. Low-fat dairy products. Buy whole ingredients instead of prepackaged foods. Buy fresh fruits and vegetables in-season from local farmers markets. Buy frozen fruits and vegetables in resealable bags. If you do not have access to quality fresh seafood, buy precooked frozen shrimp or canned fish, such as tuna, salmon, or sardines. Buy small amounts of raw or cooked vegetables, salads, or olives from the deli or salad bar at your store. Stock your pantry so you always have certain foods on hand, such as olive oil, canned tuna, canned tomatoes, rice, pasta, and beans. Cooking  Cook foods with extra-virgin olive oil instead of using butter or other vegetable oils. Have meat as a side dish, and have vegetables or grains as your main dish. This means having meat in small portions or adding small amounts of meat to foods like pasta or stew. Use beans or vegetables instead of meat in common dishes like chili or lasagna. Experiment with different cooking methods. Try roasting or broiling vegetables instead of steaming or sauteing them. Add frozen vegetables to soups, stews, pasta, or rice. Add nuts or seeds for added healthy  fat at each meal. You can add these to yogurt, salads, or vegetable dishes. Marinate fish or vegetables using olive oil, lemon juice, garlic, and fresh herbs. Meal planning  Plan to eat 1 vegetarian meal one day each week. Try to work up to 2 vegetarian meals, if possible. Eat seafood 2 or more times a week. Have healthy snacks readily available, such as: Vegetable sticks with hummus. Greek yogurt. Fruit and nut trail mix. Eat balanced meals throughout the week. This includes: Fruit: 2-3 servings a day Vegetables: 4-5 servings a  day Low-fat dairy: 2 servings a day Fish, poultry, or lean meat: 1 serving a day Beans and legumes: 2 or more servings a week Nuts and seeds: 1-2 servings a day Whole grains: 6-8 servings a day Extra-virgin olive oil: 3-4 servings a day Limit red meat and sweets to only a few servings a month What are my food choices? Mediterranean diet Recommended Grains: Whole-grain pasta. Brown rice. Bulgar wheat. Polenta. Couscous. Whole-wheat bread. [place]. Vegetables: Artichokes. Beets. Broccoli. Cabbage. Carrots. Eggplant. Green beans. Chard. Kale. Spinach. Onions. Leeks. Peas. Squash. Tomatoes. Peppers. Radishes. Fruits: Apples. Apricots. Avocado. Berries. Bananas. Cherries. Dates. Figs. Grapes. Lemons. Melon. Oranges. Peaches. Plums. Pomegranate. Meats and other protein foods: Beans. Almonds. Sunflower seeds. Pine nuts. Peanuts. [place]. Salmon. Scallops. Shrimp. [place]. Tilapia. Clams. Oysters. Eggs. Dairy: Low-fat milk. Cheese. Greek yogurt. Beverages: Water. Red wine. Herbal tea. Fats and oils: Extra virgin olive oil. Avocado oil. Grape seed oil. Sweets and desserts: [place] yogurt with honey. Baked apples. Poached pears. Trail mix. Seasoning and other foods: Basil. Cilantro. Coriander. Cumin. Mint. Parsley. Sage. Rosemary. Tarragon. Garlic. Oregano. Thyme. Pepper. Balsalmic vinegar. Tahini. Hummus. Tomato sauce. Olives. Mushrooms. Limit these Grains: Prepackaged pasta or rice dishes. Prepackaged cereal with added sugar. Vegetables: Deep fried potatoes (french fries). Fruits: Fruit canned in syrup. Meats and other protein foods: Beef. Pork. Lamb. Poultry with skin. Hot dogs. [place] Salines. Dairy: Ice cream. Sour cream. Whole milk. Beverages: Juice. Sugar-sweetened soft drinks. Beer. Liquor and spirits. Fats and oils: Butter. Canola oil. Vegetable oil. Beef fat (tallow). Lard. Sweets and desserts: Cookies. Cakes. Pies. Candy. Seasoning and other foods: Mayonnaise. Premade sauces and marinades. The  items listed may not be a complete list. Talk with your dietitian about what dietary choices are right for you. Summary The Mediterranean diet includes both food and lifestyle choices. Eat a variety of fresh fruits and vegetables, beans, nuts, seeds, and whole grains. Limit the amount of red meat and sweets that you eat. Talk with your health care provider about whether it is safe for you to drink red wine in moderation. This means 1 glass a day for nonpregnant women and 2 glasses a day for men. A glass of wine equals 5 oz (150 mL). This information is not intended to replace advice given to you by your health care provider. Make sure you discuss any questions you have with your health care provider. Document Released: 12/12/2015 Document Revised: 01/14/2016 Document Reviewed: 12/12/2015 Elsevier Interactive Patient Education  2017 [place] provider has requested that you have labwork completed today. Please go to [facility] ([id number]) on the second floor of this building before leaving the office today. You do not need to check in. If you are not called within 15 minutes please check with the front desk.

## 2022-03-12 NOTE — Progress Notes (Signed)
Assessment/Plan:   Wyatt Sandoval is a very pleasant 61 y.o. year old RH male with  a history of labile hypertension, hyperlipidemia, DM2,  CAD, ASCVD s/p L CEA (RICAs 1-39%), Hep C (treated 2018), Alcohol abuse, anxiety, depression seen today for evaluation of memory loss.  MoCA today is 21/30.  Patient has been placed by his PCP on donepezil 5 mg daily, tolerating it well.  Of note, MRI of the brain has been ordered by his PCP, and it would be beneficial for the patient to return next week to discuss the results and for further recommendations depending on the findings.  At this time, he is memory loss is highly suspicious for vascular disease, but other etiologies have to be ruled out, including dementia due to alcohol abuse and even Alzheimer's disease.   Mild Cognitive Impairment likely of vascular etiology, versus other causes  MRI brain  has been ordered by his PCP to assess for underlying structural abnormality and assess vascular load. Will follow results and he will return to discuss them in 1 week Neurocognitive testing to further evaluate cognitive concerns and determine other underlying cause of memory changes, including potential contribution from sleep, anxiety, or depression and for clarity of the diagnosis Check B12, B1 Discontinue alcohol.  Patient is to rejoin AA meetings Recommend psychotherapy-BH for generalized anxiety Recommend good control of cardiovascular risk factors. Follow with Cardiology   Continue to control mood as per PCP   History of dizziness Seen by Cardiology as he was noted to have severe OH. Dizziness is worse when BP is high, taking clonidine prn with good results.  Meds were adjusted , including discontinuation of ACE I and enalapril increased.  Patient denies any dizziness today no abnormalities found on echocardiogram Patient is on multiple pain-muscle relaxer-sleep medications in addition to alcohol binges which can also affect his dizziness and  gait recommendations as above   Subjective:   The patient is here alone   How long did patient have memory difficulties? About 5 years when he began forgetting how to do tasks, needs "to be re-instructed to do something".  He reports both short-term and long-term memory difficulties.  He also is able to remember parts of the conversation, but not all of it.   He also reports that high anxiety interferes with his thinking. repeats oneself?  "Not so much " Disoriented when walking into a room?  Patient denies   Leaving objects in unusual places?  "I don't remember where I put stuff, but not in unusual places ""sometimes I lose my wallet and I find it 2 days later " Ambulates  with difficulty?  Sometimes, due to [facility]  Recent falls?  Patient denies   Any head injuries?  Patient denies   History of seizures?   Patient denies   Wandering behavior?  Patient denies                                    Patient drives?   Occasionally he gets lost, needs more than before his GPS. Any mood changes such irritability agitation? "A little bit" Admits to being "too mad" sometimes.  If somebody has a superior degree or jaw, he becomes very anxious and "lose my memory" Any history of depression?:  Patient denies although "everybody gets a little down sometimes " Hallucinations?  Patient denies   Paranoia? "A little bit, sometimes I am afraid my wallet  would be stolen" Patient reports that has insomnia, and takes 3 medications to help with sleep.  He had a recent sleep study, with "borderline sleep apnea ", he reports that does not require CPAP.  He denies any vivid dreams, REM behavior or sleepwalking.  History of sleep apnea? "Borderline sleep apnea"  Any hygiene concerns?  Patient denies   Independent of bathing and dressing?  Endorsed Does the patient needs help with medications? Patient in charge   Who is in charge of the finances?  Patient is in charge  Any changes in appetite?  Patient denies    Patient  have trouble swallowing? Patient denies   Does the patient cook?  Patient denies   Any kitchen accidents such as leaving the stove on? Patient denies   Any headaches?  Endorsed, chronic, tension type, worse with higher BP.  The double vision? Patient denies   Any focal numbness or tingling?  Patient denies   Chronic back pain endorsed, chronic lower back pain from prior work accidents in the 1990s, followed at the pain clinic.  Unilateral weakness?  Patient denies   Any tremors?  Patient denies   Any history of anosmia?  Patient denies   Any incontinence of urine?  Patient denies   Any bowel dysfunction?  Constipation due to pain pills  History of heavy alcohol intake? Gets drunk once a week "a fifth, sometimes more and black out, some liquor during the week ".  He is thinking about rejoining AA    History of heavy tobacco use?  Endorsed, trying to quit. 1 cig a day  Family history of dementia? Mother has dementia of unknown type Patient lives alone  Pertinent labs: TSH 1.12   Past Medical History:  Diagnosis Date   Chronic lower back pain    stemming from multiple work accidents in mid 79s  -- has been followed by pain clinic since then   Daily headache    Hepatitis C    Hyperlipidemia    Hypertension    Left-sided carotid artery disease ([place])    Type II diabetes mellitus ([place])    Wears glasses      Past Surgical History:  Procedure Laterality Date   ANTERIOR CERVICAL DECOMP/DISCECTOMY FUSION  2001; 2002   BACK SURGERY     CARDIAC CATHETERIZATION N/A 08/26/2015   Procedure: Left Heart Cath and Coronary Angiography;  Surgeon: Jettie Booze, MD;  Location: [facility];  Service: Cardiovascular;  Laterality: N/A;   CAROTID ENDARTERECTOMY Left 11/07/2015   CARPAL TUNNEL RELEASE Right ~ 2015   COLONOSCOPY     ENDARTERECTOMY Left 11/07/2015   Procedure: LEFT CAROTID ENDARTERECTOMY ;  Surgeon: Serafina Mitchell, MD;  Location: [place];  Service: Vascular;  Laterality: Left;      Allergies  Allergen Reactions   Bupropion Other (See Comments)    Memory and thinking impairment - dysphoria Memory and thinking impairment - dysphoria    Current Outpatient Medications  Medication Instructions   albuterol (VENTOLIN HFA) 108 (90 Base) MCG/ACT inhaler 2 puffs, Every 4 hours PRN   amitriptyline (ELAVIL) 150 MG tablet TAKE 1 TABLET(150 MG) BY MOUTH AT BEDTIME   cloNIDine (CATAPRES) 0.1 MG tablet TAKE 1 TABLET BY MOUTH AS NEEDED FOR BLOOD PRESSURE GREATER THAN 165/95   enalapril (VASOTEC) 5 mg, Oral, Daily   gabapentin (NEURONTIN) 300 mg, Oral, Daily   hydrOXYzine (ATARAX) 10 MG tablet hydroxyzine HCl 10 mg tablet  TAKE 1 TABLET BY MOUTH EVERY DAY  metFORMIN (GLUCOPHAGE) 1000 MG tablet Take one tablet by mouth twice daily   metoprolol tartrate (LOPRESSOR) 25 mg, Oral, 2 times daily   nitroGLYCERIN (NITROSTAT) 0.4 MG SL tablet DISSOLVE 1 TABLET UNDER THE TONGUE EVERY 5 MINUTES AS NEEDED FOR CHEST PAIN. TAKE UP TO 3 DOSES AS DIRECTED   omeprazole (PRILOSEC) 40 mg, Oral, Daily   oxyCODONE (ROXICODONE) 15 mg, Oral, Every 4 hours PRN   rosuvastatin (CRESTOR) 20 MG tablet TAKE 1 TABLET(20 MG) BY MOUTH DAILY   traZODone (DESYREL) 100 mg, Oral, Daily at bedtime   triamcinolone cream (KENALOG) 0.5 % 1 application , Topical, 2 times daily   varenicline (CHANTIX) 1 MG tablet TAKE 1 TABLET(1 MG) BY MOUTH TWICE DAILY WITH A MEAL     VITALS:   Vitals:   03/12/22 0932  BP: 101/70  Pulse: 64  Resp: 20  SpO2: 97%  Weight: 196 lb (88.9 kg)  Height: '5\' 7"'$  (1.702 m)      08/16/2017    2:25 PM 02/09/2017    3:20 PM 11/23/2016    1:51 PM 08/24/2016    4:10 PM 12/02/2015    4:55 PM  Depression screen PHQ 2/9  Decreased Interest 0 0 0 0 0  Down, Depressed, Hopeless 0 0 0 0 0  PHQ - 2 Score 0 0 0 0 0    PHYSICAL EXAM   HEENT:  Normocephalic, atraumatic. The mucous membranes are moist. The superficial temporal arteries are without ropiness or tenderness. Cardiovascular:  Regular rate and rhythm. Lungs: Clear to auscultation bilaterally. Neck: There are no carotid bruits noted bilaterally.  NEUROLOGICAL:    03/12/2022   10:00 AM  Montreal Cognitive Assessment   Visuospatial/ Executive (0/5) 2  Naming (0/3) 3  Attention: Read list of digits (0/2) 2  Attention: Read list of letters (0/1) 1  Attention: Serial 7 subtraction starting at 100 (0/3) 1  Language: Repeat phrase (0/2) 0  Language : Fluency (0/1) 1  Abstraction (0/2) 2  Delayed Recall (0/5) 2  Orientation (0/6) 6  Total 20  Adjusted Score (based on education) 21       08/27/2010    2:00 PM  MMSE - Mini Mental State Exam  Orientation to time 5  Orientation to Place 5  Registration 3  Attention/ Calculation 5  Recall 3  Language- name 2 objects 2  Language- repeat 1  Language- follow 3 step command 3  Language- read & follow direction 1  Write a sentence 1  Copy design 1  Total score 30     Orientation:  Alert and oriented to person, place and time. No aphasia or dysarthria. Fund of knowledge is appropriate. Recent and remote memory are impaired.  Attention and concentration are reduced.  Able to name objects and repeat phrases. Delayed recall 2/5 Cranial nerves: There is good facial symmetry. Extraocular muscles are intact and visual fields are full to confrontational testing. Speech is fluent and clear. Soft palate rises symmetrically and there is no tongue deviation. Hearing is intact to conversational tone. Tone: Tone is good throughout. Sensation: Sensation is intact to light touch and pinprick throughout. Vibration is intact at the bilateral big toe.There is no extinction with double simultaneous stimulation. There is no sensory dermatomal level identified. Coordination: The patient has no difficulty with RAM's or FNF bilaterally. Normal finger to nose  Motor: Strength is 5/5 in the bilateral upper and lower extremities. There is no pronator drift. There are no fasciculations  noted. DTR's: Deep tendon reflexes  are 2/4 at the bilateral biceps, triceps, brachioradialis, patella and achilles.  Plantar responses are downgoing bilaterally. Gait and Station: The patient is able to ambulate without difficulty.The patient is able to heel toe walk without any difficulty.The patient is able to ambulate in a tandem fashion. The patient is able to stand in the Romberg position.     Thank you for allowing [place] the opportunity to participate in the care of this nice patient. Please do not hesitate to contact us for any questions or concerns.   Total time spent on today's visit was 45 minutes dedicated to this patient today, preparing to see patient, examining the patient, ordering tests and/or medications and counseling the patient, documenting clinical information in the EHR or other health record, independently interpreting results and communicating results to the patient/family, discussing treatment and goals, answering patient's questions and coordinating care.  Cc:  Ubaldo Glassing, PA  [facility] 03/12/2022 10:45 AM

## 2022-03-15 ENCOUNTER — Ambulatory Visit
Admission: RE | Admit: 2022-03-15 | Discharge: 2022-03-15 | Disposition: A | Discharge status: Home / Self Care | Payer: Medicare HMO | Source: Ambulatory Visit | Attending: Physician Assistant | Admitting: Physician Assistant

## 2022-03-15 DIAGNOSIS — R42 Dizziness and giddiness: Secondary | ICD-10-CM

## 2022-03-15 MED ORDER — GADOPICLENOL 0.5 MMOL/ML IV SOLN
10.0000 mL | Freq: Once | INTRAVENOUS | Status: AC | PRN
  Administered 2022-03-15: 10 mL via INTRAVENOUS

## 2022-03-17 LAB — VITAMIN B1: Vitamin B1 (Thiamine): 8 nmol/L (ref 8–30)

## 2022-03-18 NOTE — Progress Notes (Signed)
Labs are in the low side of normal, make sure that you replenish B12 and B1 over-the-counter thank you

## 2022-03-19 ENCOUNTER — Encounter: Payer: Self-pay | Admitting: Physician Assistant

## 2022-03-19 ENCOUNTER — Ambulatory Visit: Payer: Medicare HMO | Admitting: Physician Assistant

## 2022-03-19 VITALS — BP 100/68 | HR 68 | Resp 18 | Ht 67.0 in | Wt 196.0 lb

## 2022-03-19 DIAGNOSIS — R413 Other amnesia: Secondary | ICD-10-CM | POA: Diagnosis not present

## 2022-03-19 NOTE — Patient Instructions (Addendum)
It was a pleasure to see you today at our office.   Recommendations:   Continue B12, B1 daily, follow up with the primary Continue baby aspirin daily  Continue donepezil 10 mg daily for memory  Talk with your doctor about omeprazole, to change it for other reflux medicine as it may contribute to memory loss Consider psychotherapy for anxiety  Discontinue alcohol and cigarettes Continue attendance to pain clinic for chronic pain. He is on multiple muscle relaxers and pain meds which it could affect memory Neurocognitive testing    Whom to call:  Memory  decline, memory medications: Call our office [phone]   For psychiatric meds, mood meds: Please have your primary care physician manage these medications.    If you have any severe symptoms of a stroke, or other severe issues such as confusion,severe chills or fever, etc call 911 or go to the ER as you may need to be evaluated further   Feel free to visit Facebook page " [email or web address]" for tips of how to care for people with memory problems.      RECOMMENDATIONS FOR ALL PATIENTS WITH MEMORY PROBLEMS: 1. Continue to exercise (Recommend 30 minutes of walking everyday, or 3 hours every week) 2. Increase social interactions - continue going to [place] and enjoy social gatherings with friends and family 3. Eat healthy, avoid fried foods and eat more fruits and vegetables 4. Maintain adequate blood pressure, blood sugar, and blood cholesterol level. Reducing the risk of stroke and cardiovascular disease also helps promoting better memory. 5. Avoid stressful situations. Live a simple life and avoid aggravations. Organize your time and prepare for the next day in anticipation. 6. Sleep well, avoid any interruptions of sleep and avoid any distractions in the bedroom that may interfere with adequate sleep quality 7. Avoid sugar, avoid sweets as there is a strong link between excessive sugar intake, diabetes, and cognitive impairment We discussed  the Mediterranean diet, which has been shown to help patients reduce the risk of progressive memory disorders and reduces cardiovascular risk. This includes eating fish, eat fruits and green leafy vegetables, nuts like almonds and hazelnuts, walnuts, and also use olive oil. Avoid fast foods and fried foods as much as possible. Avoid sweets and sugar as sugar use has been linked to worsening of memory function.  There is always a concern of gradual progression of memory problems. If this is the case, then we may need to adjust level of care according to patient needs. Support, both to the patient and caregiver, should then be put into place.        FALL PRECAUTIONS: Be cautious when walking. Scan the area for obstacles that may increase the risk of trips and falls. When getting up in the mornings, sit up at the edge of the bed for a few minutes before getting out of bed. Consider elevating the bed at the head end to avoid drop of blood pressure when getting up. Walk always in a well-lit room (use night lights in the walls). Avoid area rugs or power cords from appliances in the middle of the walkways. Use a walker or a cane if necessary and consider physical therapy for balance exercise. Get your eyesight checked regularly.  FINANCIAL OVERSIGHT: Supervision, especially oversight when making financial decisions or transactions is also recommended.  HOME SAFETY: Consider the safety of the kitchen when operating appliances like stoves, microwave oven, and blender. Consider having supervision and share cooking responsibilities until no longer able to participate in those. Accidents  with firearms and other hazards in the house should be identified and addressed as well.   ABILITY TO BE LEFT ALONE: If patient is unable to contact 911 operator, consider using LifeLine, or when the need is there, arrange for someone to stay with patients. Smoking is a fire hazard, consider supervision or cessation. Risk of  wandering should be assessed by caregiver and if detected at any point, supervision and safe proof recommendations should be instituted.  MEDICATION SUPERVISION: Inability to self-administer medication needs to be constantly addressed. Implement a mechanism to ensure safe administration of the medications.   DRIVING: Regarding driving, in patients with progressive memory problems, driving will be impaired. We advise to have someone else do the driving if trouble finding directions or if minor accidents are reported. Independent driving assessment is available to determine safety of driving.   If you are interested in the driving assessment, you can contact the following:  [employer] in [place]  [place] [phone] or [phone]    [place] refers to food and lifestyle choices that are based on the traditions of countries located on the [employer]. This way of eating has been shown to help prevent certain conditions and improve outcomes for people who have chronic diseases, like kidney disease and heart disease. What are tips for following this plan? Lifestyle  Cook and eat meals together with your family, when possible. Drink enough fluid to keep your urine clear or pale yellow. Be physically active every day. This includes: Aerobic exercise like running or swimming. Leisure activities like gardening, walking, or housework. Get 7-8 hours of sleep each night. If recommended by your health care provider, drink red wine in moderation. This means 1 glass a day for nonpregnant women and 2 glasses a day for men. A glass of wine equals 5 oz (150 mL). Reading food labels  Check the serving size of packaged foods. For foods such as rice and pasta, the serving size refers to the amount of cooked product, not dry. Check the total fat in  packaged foods. Avoid foods that have saturated fat or trans fats. Check the ingredients list for added sugars, such as corn syrup. Shopping  At the grocery store, buy most of your food from the areas near the walls of the store. This includes: Fresh fruits and vegetables (produce). Grains, beans, nuts, and seeds. Some of these may be available in unpackaged forms or large amounts (in bulk). Fresh seafood. Poultry and eggs. Low-fat dairy products. Buy whole ingredients instead of prepackaged foods. Buy fresh fruits and vegetables in-season from local farmers markets. Buy frozen fruits and vegetables in resealable bags. If you do not have access to quality fresh seafood, buy precooked frozen shrimp or canned fish, such as tuna, salmon, or sardines. Buy small amounts of raw or cooked vegetables, salads, or olives from the deli or salad bar at your store. Stock your pantry so you always have certain foods on hand, such as olive oil, canned tuna, canned tomatoes, rice, pasta, and beans. Cooking  Cook foods with extra-virgin olive oil instead of using butter or other vegetable oils. Have meat as a side dish, and have vegetables or grains as your main dish. This means having meat in small portions or adding small amounts of meat to foods like pasta or stew. Use beans or vegetables instead of meat in common dishes like chili or lasagna. Experiment with  different cooking methods. Try roasting or broiling vegetables instead of steaming or sauteing them. Add frozen vegetables to soups, stews, pasta, or rice. Add nuts or seeds for added healthy fat at each meal. You can add these to yogurt, salads, or vegetable dishes. Marinate fish or vegetables using olive oil, lemon juice, garlic, and fresh herbs. Meal planning  Plan to eat 1 vegetarian meal one day each week. Try to work up to 2 vegetarian meals, if possible. Eat seafood 2 or more times a week. Have healthy snacks readily available, such  as: Vegetable sticks with hummus. Greek yogurt. Fruit and nut trail mix. Eat balanced meals throughout the week. This includes: Fruit: 2-3 servings a day Vegetables: 4-5 servings a day Low-fat dairy: 2 servings a day Fish, poultry, or lean meat: 1 serving a day Beans and legumes: 2 or more servings a week Nuts and seeds: 1-2 servings a day Whole grains: 6-8 servings a day Extra-virgin olive oil: 3-4 servings a day Limit red meat and sweets to only a few servings a month What are my food choices? Mediterranean diet Recommended Grains: Whole-grain pasta. Brown rice. Bulgar wheat. Polenta. Couscous. Whole-wheat bread. [place]. Vegetables: Artichokes. Beets. Broccoli. Cabbage. Carrots. Eggplant. Green beans. Chard. Kale. Spinach. Onions. Leeks. Peas. Squash. Tomatoes. Peppers. Radishes. Fruits: Apples. Apricots. Avocado. Berries. Bananas. Cherries. Dates. Figs. Grapes. Lemons. Melon. Oranges. Peaches. Plums. Pomegranate. Meats and other protein foods: Beans. Almonds. Sunflower seeds. Pine nuts. Peanuts. [place]. Salmon. Scallops. Shrimp. [place]. Tilapia. Clams. Oysters. Eggs. Dairy: Low-fat milk. Cheese. Greek yogurt. Beverages: Water. Red wine. Herbal tea. Fats and oils: Extra virgin olive oil. Avocado oil. Grape seed oil. Sweets and desserts: [place] yogurt with honey. Baked apples. Poached pears. Trail mix. Seasoning and other foods: Basil. Cilantro. Coriander. Cumin. Mint. Parsley. Sage. Rosemary. Tarragon. Garlic. Oregano. Thyme. Pepper. Balsalmic vinegar. Tahini. Hummus. Tomato sauce. Olives. Mushrooms. Limit these Grains: Prepackaged pasta or rice dishes. Prepackaged cereal with added sugar. Vegetables: Deep fried potatoes (french fries). Fruits: Fruit canned in syrup. Meats and other protein foods: Beef. Pork. Lamb. Poultry with skin. Hot dogs. [place] Salines. Dairy: Ice cream. Sour cream. Whole milk. Beverages: Juice. Sugar-sweetened soft drinks. Beer. Liquor and spirits. Fats and oils:  Butter. Canola oil. Vegetable oil. Beef fat (tallow). Lard. Sweets and desserts: Cookies. Cakes. Pies. Candy. Seasoning and other foods: Mayonnaise. Premade sauces and marinades. The items listed may not be a complete list. Talk with your dietitian about what dietary choices are right for you. Summary The Mediterranean diet includes both food and lifestyle choices. Eat a variety of fresh fruits and vegetables, beans, nuts, seeds, and whole grains. Limit the amount of red meat and sweets that you eat. Talk with your health care provider about whether it is safe for you to drink red wine in moderation. This means 1 glass a day for nonpregnant women and 2 glasses a day for men. A glass of wine equals 5 oz (150 mL). This information is not intended to replace advice given to you by your health care provider. Make sure you discuss any questions you have with your health care provider. Document Released: 12/12/2015 Document Revised: 01/14/2016 Document Reviewed: 12/12/2015 Elsevier Interactive Patient Education  2017 [place] provider has requested that you have labwork completed today. Please go to [facility] ([id number]) on the second floor of this building before leaving the office today. You do not need to check in. If you are not called within 15 minutes please check with the front  desk.

## 2022-03-19 NOTE — Progress Notes (Signed)
Assessment/Plan:   Memory Impairment   Wyatt Sandoval is a very pleasant 61 y.o. RH male seen today in follow up to discuss the 03/16/22  MRI of the brain results. These were personally reviewed, remarkable for  chronic small vessel ischemia, prominent perivascular spaces versus small chronic lacunar infarcts within the bilateral caudate nuclei.He is taking ASA daily and is to make lifestyle changes "I am gonna do it".       Follow up in 6  months. Neurocognitive testing for clarity of diagnosis and to evaluate other causes of memory loss.  Discontinue alcohol, recommend AAA Recommend psychotherapy Continue to control mood as per PCP Recommend good control of cardiovascular risk factors. Continue Baby ASA daily  Recommend B12 and B1 replenishment  Recommend Cards for evaluation of  OH  Continue attendance to pain clinic for chronic pain. He is on multiple muscle relaxers and pain meds which it could affect memory Consider changing omeprazole for another agent as omeprazole may contribute in some way to his memory impairment Neuropsych testing for clarity of diagnosis and disease trajectory        Subjective:    This patient is here alone  Previous records as well as any outside records available were reviewed prior to todays visit. Last seen on 03/12/22, at which time his MoCA was 21/30   Any changes in memory since last visit? Denies Tolerating meds? Endorsed.   Initial evaluation 03/12/2022  How long did patient have memory difficulties? About 5 years when he began forgetting how to do tasks, needs "to be re-instructed to do something".  He reports both short-term and long-term memory difficulties.  He also is able to remember parts of the conversation, but not all of it.   He also reports that high anxiety interferes with his thinking. repeats oneself?  "Not so much " Disoriented when walking into a room?  Patient denies   Leaving objects in unusual places?  "I don't  remember where I put stuff, but not in unusual places ""sometimes I lose my wallet and I find it 2 days later " Ambulates  with difficulty?  Sometimes, due to [facility]  Recent falls?  Patient denies   Any head injuries?  Patient denies   History of seizures?   Patient denies   Wandering behavior?  Patient denies                                    Patient drives?   Occasionally he gets lost, needs more than before his GPS. Any mood changes such irritability agitation? "A little bit" Admits to being "too mad" sometimes.  If somebody has a superior degree or jaw, he becomes very anxious and "lose my memory" Any history of depression?:  Patient denies although "everybody gets a little down sometimes " Hallucinations?  Patient denies   Paranoia? "A little bit, sometimes I am afraid my wallet would be stolen" Patient reports that has insomnia, and takes 3 medications to help with sleep.  He had a recent sleep study, with "borderline sleep apnea ", he reports that does not require CPAP.  He denies any vivid dreams, REM behavior or sleepwalking.  History of sleep apnea? "Borderline sleep apnea"  Any hygiene concerns?  Patient denies   Independent of bathing and dressing?  Endorsed Does the patient needs help with medications? Patient in charge   Who is in charge of the finances?  Patient is in charge  Any changes in appetite?  Patient denies    Patient have trouble swallowing? Patient denies   Does the patient cook?  Patient denies   Any kitchen accidents such as leaving the stove on? Patient denies   Any headaches?  Endorsed, chronic, tension type, worse with higher BP.  The double vision? Patient denies   Any focal numbness or tingling?  Patient denies   Chronic back pain endorsed, chronic lower back pain from prior work accidents in the 1990s, followed at the pain clinic.  Unilateral weakness?  Patient denies   Any tremors?  Patient denies   Any history of anosmia?  Patient denies   Any incontinence  of urine?  Patient denies   Any bowel dysfunction?  Constipation due to pain pills  History of heavy alcohol intake? Gets drunk once a week "a fifth, sometimes more and black out, some liquor during the week ".  He is thinking about rejoining AA    History of heavy tobacco use?  Endorsed, trying to quit. 1 cig a day  Family history of dementia? Mother has dementia of unknown type Patient lives alone   Pertinent labs: TSH 1.12   Personally reviewed MRI brain 03/16/22 was remarkable for chronic small vessel ischemia, prominent perivascular spaces versus small chronic lacunar infarcts within the bilateral caudate nuclei.    CURRENT MEDICATIONS:  Outpatient Encounter Medications as of 03/19/2022  Medication Sig   albuterol (VENTOLIN HFA) 108 (90 Base) MCG/ACT inhaler 2 puffs every 4 (four) hours as needed for wheezing or shortness of breath.   amitriptyline (ELAVIL) 150 MG tablet TAKE 1 TABLET(150 MG) BY MOUTH AT BEDTIME   cloNIDine (CATAPRES) 0.1 MG tablet TAKE 1 TABLET BY MOUTH AS NEEDED FOR BLOOD PRESSURE GREATER THAN 165/95   enalapril (VASOTEC) 5 MG tablet Take 1 tablet (5 mg total) by mouth daily.   gabapentin (NEURONTIN) 300 MG capsule Take 1 capsule (300 mg total) by mouth daily.   hydrOXYzine (ATARAX) 10 MG tablet hydroxyzine HCl 10 mg tablet  TAKE 1 TABLET BY MOUTH EVERY DAY   metFORMIN (GLUCOPHAGE) 1000 MG tablet Take one tablet by mouth twice daily   metoprolol tartrate (LOPRESSOR) 25 MG tablet Take 1 tablet (25 mg total) by mouth 2 (two) times daily.   nitroGLYCERIN (NITROSTAT) 0.4 MG SL tablet DISSOLVE 1 TABLET UNDER THE TONGUE EVERY 5 MINUTES AS NEEDED FOR CHEST PAIN. TAKE UP TO 3 DOSES AS DIRECTED (Patient not taking: Reported on 03/12/2022)   omeprazole (PRILOSEC) 40 MG capsule Take 1 capsule (40 mg total) by mouth daily.   oxyCODONE (ROXICODONE) 15 MG immediate release tablet Take 1 tablet (15 mg total) by mouth every 4 (four) hours as needed for pain.   rosuvastatin (CRESTOR)  20 MG tablet TAKE 1 TABLET(20 MG) BY MOUTH DAILY   traZODone (DESYREL) 100 MG tablet Take 100 mg by mouth at bedtime.   triamcinolone cream (KENALOG) 0.5 % Apply 1 application topically 2 (two) times daily.   varenicline (CHANTIX) 1 MG tablet TAKE 1 TABLET(1 MG) BY MOUTH TWICE DAILY WITH A MEAL   No facility-administered encounter medications on file as of 03/19/2022.       08/27/2010    2:00 PM  MMSE - Mini Mental State Exam  Orientation to time 5  Orientation to Place 5  Registration 3  Attention/ Calculation 5  Recall 3  Language- name 2 objects 2  Language- repeat 1  Language- follow 3 step command 3  Language- read &  follow direction 1  Write a sentence 1  Copy design 1  Total score 30      03/12/2022   10:00 AM  Montreal Cognitive Assessment   Visuospatial/ Executive (0/5) 2  Naming (0/3) 3  Attention: Read list of digits (0/2) 2  Attention: Read list of letters (0/1) 1  Attention: Serial 7 subtraction starting at 100 (0/3) 1  Language: Repeat phrase (0/2) 0  Language : Fluency (0/1) 1  Abstraction (0/2) 2  Delayed Recall (0/5) 2  Orientation (0/6) 6  Total 20  Adjusted Score (based on education) 21   Thank you for allowing [place] the opportunity to participate in the care of this nice patient. Please do not hesitate to contact us for any questions or concerns.   Total time spent on today's visit was 31 minutes dedicated to this patient today, preparing to see patient, examining the patient, ordering tests and/or medications and counseling the patient, documenting clinical information in the EHR or other health record, independently interpreting results and communicating results to the patient/family, discussing treatment and goals, answering patient's questions and coordinating care.  Cc:  Ubaldo Glassing, PA  Clarise Cruz [facility] 03/19/2022 7:44 AM

## 2022-04-01 ENCOUNTER — Other Ambulatory Visit: Payer: Self-pay | Admitting: Internal Medicine

## 2022-04-01 DIAGNOSIS — F17200 Nicotine dependence, unspecified, uncomplicated: Secondary | ICD-10-CM

## 2022-04-01 NOTE — Telephone Encounter (Signed)
Pt's pharmacy is requesting a refill on varenicline (Chantix) 1 mg tablet. Would Dr. Harrington Challenger like to refill this medication? Please address

## 2022-04-01 NOTE — Telephone Encounter (Signed)
OK to refill

## 2022-06-17 ENCOUNTER — Other Ambulatory Visit: Payer: Self-pay

## 2022-06-17 MED ORDER — ENALAPRIL MALEATE 5 MG PO TABS: 5.0000 mg | ORAL_TABLET | Freq: Every day | ORAL | 2 refills | Status: DC

## 2022-07-16 ENCOUNTER — Other Ambulatory Visit: Payer: Self-pay | Admitting: Internal Medicine

## 2022-07-16 DIAGNOSIS — F17200 Nicotine dependence, unspecified, uncomplicated: Secondary | ICD-10-CM

## 2022-09-29 ENCOUNTER — Encounter: Payer: Self-pay | Admitting: Physician Assistant

## 2022-09-29 ENCOUNTER — Ambulatory Visit: Payer: Medicare HMO | Admitting: Physician Assistant

## 2022-09-29 VITALS — BP 121/78 | HR 82 | Resp 20 | Ht 67.0 in | Wt 202.0 lb

## 2022-09-29 DIAGNOSIS — R413 Other amnesia: Secondary | ICD-10-CM

## 2022-09-29 NOTE — Progress Notes (Signed)
Assessment/Plan:   Memory Impairment   Wyatt Sandoval is a very pleasant 62 y.o. RH male with  a history of labile hypertension, hyperlipidemia, DM2,  CAD, ASCVD s/p L CEA (RICAs 1-39%), Hep C (treated 2018),  prior Alcohol abuse, anxiety, depression, seen in follow up for memory loss. Prior 03/16/22  MRI of the brain personally reviewed, remarkable for  chronic small vessel ischemia, prominent perivascular spaces versus small chronic lacunar infarcts within the bilateral caudate nuclei.  Patient is not on antidementia medications.  He takes aspirin daily, and recently he has changed lifestyle, eating better, discontinuing alcohol and tobacco.  He has a scheduled neurocognitive testing sometime in September of this year, for clarity of the diagnosis.  Patient continues to be independent with his ADLs, and able to drive without any difficulties.    Recommendations:   Follow up in 6 months. Patient is scheduled for neuropsychological evaluation on 01/2023 for diagnostic clarity.  Recommend psychotherapy for anxiety.  Recommend good control of cardiovascular risk factors. Continue baby ASA daily. Continue attendance to pain clinic for management of chronic pain. Multiple muscle relaxers and pain meds which can affect memory.  Continue to control mood as per PCP  History of dizziness severe OH is better managed by cardiology with adjustment of his medications.    Patient is on multiple pain-muscle relaxer-sleep medications in addition to alcohol binges which can also affect his dizziness and gait recommendations as above  Subjective:   This patient is here alone. Previous records as well as any outside records available were reviewed prior to todays visit.   Patient was last seen on 03/19/22 with a MoCA of 21/30      Any changes in memory since last visit?  "About the same ".  He is able to remember parts of the conversation, but not all of it.  Sometimes high anxiety interferes with his  thinking. repeats oneself?  "Not so much " Disoriented when walking into a room?  Patient denies  Leaving objects in unusual places?  Patient denies   Wandering behavior?   denies   Any personality changes since last visit?   denies.  He continues to have some moments of irritability, and anxiety. Any worsening depression?: denies   Hallucinations or paranoia?  denies   Seizures?   denies    Any sleep changes?  Sleeps better.  Denies  vivid dreams, REM behavior or sleepwalking   Sleep apnea?   denies   Any hygiene concerns?   denies   Independent of bathing and dressing?  Endorsed  Does the patient needs help with medications? patient is in charge   Who is in charge of the finances?   Patient is in charge     Any changes in appetite?  denies     Patient have trouble swallowing?  denies   Does the patient cook?  Yes  Any kitchen accidents such as leaving the stove on?   denies   Any headaches?  He has chronic tension headache especially worse if blood pressure is high Vision changes? denies Chronic back pain he has chronic lower back pain from prior work accident in the 1990s, followed at the pain clinic. Ambulates with difficulty?    denies   Recent falls or head injuries?    denies     Unilateral weakness, numbness or tingling?   denies   Any tremors?  denies   Any anosmia?    denies   Any incontinence of urine?  denies  Any bowel dysfunction?  denies      Patient lives alone    Does the patient drive?  Yes, denies any issues. Alcohol? None since October  Tobacco? None since Jan  Recreational drugs? No    Initial evaluation 03/12/2022  How long did patient have memory difficulties? About 5 years when he began forgetting how to do tasks, needs "to be re-instructed to do something".  He reports both short-term and long-term memory difficulties.  He also is able to remember parts of the conversation, but not all of it.   He also reports that high anxiety interferes with his  thinking. repeats oneself?  "Not so much " Disoriented when walking into a room?  Patient denies   Leaving objects in unusual places?  "I don't remember where I put stuff, but not in unusual places ""sometimes I lose my wallet and I find it 2 days later " Ambulates  with difficulty?  Sometimes, due to [facility]  Recent falls?  Patient denies   Any head injuries?  Patient denies   History of seizures?   Patient denies   Wandering behavior?  Patient denies                                    Patient drives?   Occasionally he gets lost, needs more than before his GPS. Any mood changes such irritability agitation? "A little bit" Admits to being "too mad" sometimes.  If somebody has a superior degree or job, he becomes very anxious and "lose my memory" Any history of depression?:  Patient denies although "everybody gets a little down sometimes " Hallucinations?  Patient denies   Paranoia? "A little bit, sometimes I am afraid my wallet would be stolen" Patient reports that has insomnia, and takes 3 medications to help with sleep.  He had a recent sleep study, with "borderline sleep apnea ", he reports that does not require CPAP.  He denies any vivid dreams, REM behavior or sleepwalking.  History of sleep apnea? "Borderline sleep apnea"  Any hygiene concerns?  Patient denies   Independent of bathing and dressing?  Endorsed Does the patient needs help with medications? Patient in charge   Who is in charge of the finances?  Patient is in charge  Any changes in appetite?  Patient denies    Patient have trouble swallowing? Patient denies   Does the patient cook?  Patient denies   Any kitchen accidents such as leaving the stove on? Patient denies   Any headaches?  Endorsed, chronic, tension type, worse with higher BP.  The double vision? Patient denies   Any focal numbness or tingling?  Patient denies   Chronic back pain endorsed, chronic lower back pain from prior work accidents in the 1990s, followed at the  pain clinic.  Unilateral weakness?  Patient denies   Any tremors?  Patient denies   Any history of anosmia?  Patient denies   Any incontinence of urine?  Patient denies   Any bowel dysfunction?  Constipation due to pain pills  History of heavy alcohol intake? Gets drunk once a week "a fifth, sometimes more and black out, some liquor during the week ".  He is thinking about rejoining AA    History of heavy tobacco use?  Endorsed, trying to quit. 1 cig a day  Family history of dementia? Mother has dementia of unknown type Patient lives alone   Pertinent labs:  TSH 1.12   Personally reviewed MRI brain 03/16/22 was remarkable for chronic small vessel ischemia, prominent perivascular spaces versus small chronic lacunar infarcts within the bilateral caudate nuclei.    Past Medical History:  Diagnosis Date   Chronic lower back pain    stemming from multiple work accidents in mid 1990s  -- has been followed by pain clinic since then   Daily headache    Hepatitis C    Hyperlipidemia    Hypertension    Left-sided carotid artery disease (HCC)    Type II diabetes mellitus (HCC)    Wears glasses      Past Surgical History:  Procedure Laterality Date   ANTERIOR CERVICAL DECOMP/DISCECTOMY FUSION  2001; 2002   BACK SURGERY     CARDIAC CATHETERIZATION N/A 08/26/2015   Procedure: Left Heart Cath and Coronary Angiography;  Surgeon: Corky Crafts, MD;  Location: [facility] INVASIVE CV LAB;  Service: Cardiovascular;  Laterality: N/A;   CAROTID ENDARTERECTOMY Left 11/07/2015   CARPAL TUNNEL RELEASE Right ~ 2015   COLONOSCOPY     ENDARTERECTOMY Left 11/07/2015   Procedure: LEFT CAROTID ENDARTERECTOMY ;  Surgeon: Nada Libman, MD;  Location: [facility] OR;  Service: Vascular;  Laterality: Left;     PREVIOUS MEDICATIONS:   CURRENT MEDICATIONS:  Outpatient Encounter Medications as of 09/29/2022  Medication Sig   albuterol (VENTOLIN HFA) 108 (90 Base) MCG/ACT inhaler 2 puffs every 4 (four) hours as needed for  wheezing or shortness of breath.   amitriptyline (ELAVIL) 150 MG tablet TAKE 1 TABLET(150 MG) BY MOUTH AT BEDTIME   cloNIDine (CATAPRES) 0.1 MG tablet TAKE 1 TABLET BY MOUTH AS NEEDED FOR BLOOD PRESSURE GREATER THAN 165/95   enalapril (VASOTEC) 5 MG tablet Take 1 tablet (5 mg total) by mouth daily.   gabapentin (NEURONTIN) 300 MG capsule Take 1 capsule (300 mg total) by mouth daily.   hydrOXYzine (ATARAX) 10 MG tablet hydroxyzine HCl 10 mg tablet  TAKE 1 TABLET BY MOUTH EVERY DAY   metFORMIN (GLUCOPHAGE) 1000 MG tablet Take one tablet by mouth twice daily   metoprolol tartrate (LOPRESSOR) 25 MG tablet Take 1 tablet (25 mg total) by mouth 2 (two) times daily.   nitroGLYCERIN (NITROSTAT) 0.4 MG SL tablet DISSOLVE 1 TABLET UNDER THE TONGUE EVERY 5 MINUTES AS NEEDED FOR CHEST PAIN. TAKE UP TO 3 DOSES AS DIRECTED   omeprazole (PRILOSEC) 40 MG capsule Take 1 capsule (40 mg total) by mouth daily.   oxyCODONE (ROXICODONE) 15 MG immediate release tablet Take 1 tablet (15 mg total) by mouth every 4 (four) hours as needed for pain.   rosuvastatin (CRESTOR) 20 MG tablet TAKE 1 TABLET(20 MG) BY MOUTH DAILY   traZODone (DESYREL) 100 MG tablet Take 100 mg by mouth at bedtime.   triamcinolone cream (KENALOG) 0.5 % Apply 1 application topically 2 (two) times daily.   varenicline (CHANTIX) 1 MG tablet TAKE 1 TABLET(1 MG) BY MOUTH TWICE DAILY WITH A MEAL   No facility-administered encounter medications on file as of 09/29/2022.     Objective:     PHYSICAL EXAMINATION:    VITALS:   Vitals:   09/29/22 1101  BP: 121/78  Pulse: 82  Resp: 20  SpO2: 95%  Weight: 202 lb (91.6 kg)  Height: 5\' 7"  (1.702 m)    GEN:  The patient appears stated age and is in NAD. HEENT:  Normocephalic, atraumatic.   Neurological examination:  General: NAD, well-groomed, appears stated age. Orientation: The patient is alert. Anxious. Oriented  to person, place and date Cranial nerves: There is good facial symmetry.mild  nystagmus noted on the left otherwise intact visual fields.  The speech is fluent and clear. No aphasia or dysarthria. Fund of knowledge is appropriate. Recent memory impaired and remote memory is normal.  Attention and concentration are normal.  Able to name objects and repeat phrases.  Hearing is intact to conversational tone .  Sensation: Sensation is intact to light touch throughout Motor: Strength is at least antigravity x4. Tremors: none  DTR's 2/4 in UE/LE        03/12/2022   10:00 AM  Montreal Cognitive Assessment   Visuospatial/ Executive (0/5) 2  Naming (0/3) 3  Attention: Read list of digits (0/2) 2  Attention: Read list of letters (0/1) 1  Attention: Serial 7 subtraction starting at 100 (0/3) 1  Language: Repeat phrase (0/2) 0  Language : Fluency (0/1) 1  Abstraction (0/2) 2  Delayed Recall (0/5) 2  Orientation (0/6) 6  Total 20  Adjusted Score (based on education) 21       08/27/2010    2:00 PM  MMSE - Mini Mental State Exam  Orientation to time 5  Orientation to Place 5  Registration 3  Attention/ Calculation 5  Recall 3  Language- name 2 objects 2  Language- repeat 1  Language- follow 3 step command 3  Language- read & follow direction 1  Write a sentence 1  Copy design 1  Total score 30       Movement examination: Tone: There is normal tone in the UE/LE Abnormal movements:  no tremor.  No myoclonus.  No asterixis.   Coordination:  There is no decremation with RAM's. Normal finger to nose  Gait and Station: The patient has no difficulty arising out of a deep-seated chair without the use of the hands. The patient's stride length is good.  Gait is cautious and narrow.   Thank you for allowing [place] the opportunity to participate in the care of this nice patient. Please do not hesitate to contact us for any questions or concerns.   Total time spent on today's visit was 24 minutes dedicated to this patient today, preparing to see patient, examining the  patient, ordering tests and/or medications and counseling the patient, documenting clinical information in the EHR or other health record, independently interpreting results and communicating results to the patient/family, discussing treatment and goals, answering patient's questions and coordinating care.  Cc:  [name], PA  [facility] 09/29/2022 11:22 AM

## 2022-10-20 ENCOUNTER — Other Ambulatory Visit: Payer: Self-pay | Admitting: Internal Medicine

## 2022-10-20 DIAGNOSIS — F17200 Nicotine dependence, unspecified, uncomplicated: Secondary | ICD-10-CM

## 2022-12-01 ENCOUNTER — Other Ambulatory Visit: Payer: Self-pay | Admitting: Physician Assistant

## 2022-12-01 DIAGNOSIS — R42 Dizziness and giddiness: Secondary | ICD-10-CM

## 2022-12-09 ENCOUNTER — Other Ambulatory Visit: Payer: Self-pay | Admitting: Internal Medicine

## 2022-12-15 ENCOUNTER — Ambulatory Visit
Admission: RE | Admit: 2022-12-15 | Discharge: 2022-12-15 | Disposition: A | Discharge status: Home / Self Care | Payer: Medicare HMO | Source: Ambulatory Visit | Attending: Physician Assistant | Admitting: Physician Assistant

## 2022-12-15 DIAGNOSIS — R42 Dizziness and giddiness: Secondary | ICD-10-CM

## 2023-01-08 ENCOUNTER — Encounter: Payer: Self-pay | Admitting: Psychology

## 2023-01-08 ENCOUNTER — Ambulatory Visit: Payer: Medicare HMO

## 2023-01-08 ENCOUNTER — Ambulatory Visit: Payer: Medicare HMO | Admitting: Psychology

## 2023-01-08 DIAGNOSIS — G3184 Mild cognitive impairment, so stated: Secondary | ICD-10-CM

## 2023-01-08 DIAGNOSIS — G4700 Insomnia, unspecified: Secondary | ICD-10-CM | POA: Insufficient documentation

## 2023-01-08 DIAGNOSIS — R4189 Other symptoms and signs involving cognitive functions and awareness: Secondary | ICD-10-CM

## 2023-01-08 HISTORY — DX: Mild cognitive impairment of uncertain or unknown etiology: G31.84

## 2023-01-08 NOTE — Progress Notes (Signed)
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 Recall 4 2 Well Below Average    Figure Recognition 4/8 9-20 Below Average        Intellectual Functioning:      Standard Score Percentile   Barona Formula Estimated Premorbid IQ: 88 21 Below Average        Standard Score Percentile   Test of Premorbid Functioning: 69 2 Well Below Average       Wechsler Adult Intelligence Scale (WAIS-IV) Short Form*: Standard Score/ Scaled Score Percentile   Full Scale IQ  81 10 Below Average    Information  9 37 Average    Matrix Reasoning 5 5 Well Below Average    Coding 7 16 Below Average  *From [employer] (2009)          Attention/Executive Function:     Trail Making Test (TMT): Raw Score (T Score) Percentile     Part A 34 secs.,  0 errors (54) 66 Average    Part B Discontinued --- Impaired         Scaled Score Percentile   WAIS-IV Digit Span: 5 5 Well Below Average    Forward 5 5 Well Below Average    Backward 6 9 Below Average    Sequencing 7 16 Below Average       D-KEFS Verbal Fluency Test: Raw Score (Scaled Score) Percentile     Letter Total Correct 19 (5) 5 Well Below Average    Category Total Correct 40 (11) 63 Average    Category  Switching Total Correct 6 (2) <1 Exceptionally Low    Category Switching Accuracy 4 (2) <1 Exceptionally Low      Total Set Loss Errors 4 (8) 25 Average      Total Repetition Errors 7 (6) 9 Below Average       Language:     Verbal Fluency Test: Raw Score (T Score) Percentile     Phonemic Fluency (FAS) 19 (39) 14 Below Average    Animal Fluency 23 (65) 93 Well Above Average        NAB Language Module, Form 1: T Score Percentile     Naming 30/31 (54) 66 Average       Visuospatial/Visuoconstruction:      Raw Score Percentile   Clock Drawing: 9/10 --- Within Normal  Limits       Mood and Personality:      Raw Score Percentile   Beck Depression Inventory - II: 19 --- Mild  PROMIS Anxiety Questionnaire: 12 --- None to Slight       Additional Questionnaires:      Raw Score Percentile   PROMIS Sleep Disturbance Questionnaire: 27 --- Mild   Informed Consent and Coding/Compliance:   The current evaluation represents a clinical evaluation for the purposes previously outlined by the referral source and is in no way reflective of a forensic evaluation.   Wyatt Sandoval. Penninger was provided with a verbal description of the nature and purpose of the present neuropsychological evaluation. Also reviewed were the foreseeable risks and/or discomforts and benefits of the procedure, limits of confidentiality, and mandatory reporting requirements of this provider. The patient was given the opportunity to ask questions and receive answers about the evaluation. Oral consent to participate was provided by the patient.   This evaluation was conducted by Newman Nickels, Ph.D., ABPP-CN, board certified clinical neuropsychologist. Wyatt Sandoval. [place] completed a clinical interview with Dr. Milbert Coulter, billed as one unit [phone], and 115 minutes of cognitive testing and scoring, billed as one unit [phone] and three additional units [id number]. Psychometrist Shan Levans, B.S. assisted Dr. Milbert Coulter with test administration and scoring procedures.  As a separate and discrete service, one unit [id number] and two units [phone] were billed for Dr. Tammy Sours time spent in interpretation and report writing.

## 2023-01-08 NOTE — Progress Notes (Signed)
   Psychometrician Note   Cognitive testing was administered to Elayne Guerin by Shan Levans, B.S. (psychometrist) under the supervision of Dr. Newman Nickels, Ph.D., licensed psychologist on 01/08/2023. Mr. Helman did not appear overtly distressed by the testing session per behavioral observation or responses across self-report questionnaires. Rest breaks were offered.    The battery of tests administered was selected by Dr. Newman Nickels, Ph.D. with consideration to Mr. Juniper current level of functioning, the nature of his symptoms, emotional and behavioral responses during interview, level of literacy, observed level of motivation/effort, and the nature of the referral question. This battery was communicated to the psychometrist. Communication between Dr. Newman Nickels, Ph.D. and the psychometrist was ongoing throughout the evaluation and Dr. Newman Nickels, Ph.D. was immediately accessible at all times. Dr. Newman Nickels, Ph.D. provided supervision to the psychometrist on the date of this service to the extent necessary to assure the quality of all services provided.    Khaalis Ismaili will return within approximately 1-2 weeks for an interactive feedback session with Dr. Milbert Coulter at which time his test performances, clinical impressions, and treatment recommendations will be reviewed in detail. Mr. Silverstone understands he can contact our office should he require our assistance before this time.  A total of 115 minutes of billable time were spent face-to-face with Mr. Sowerby by the psychometrist. This includes both test administration and scoring time. Billing for these services is reflected in the clinical report generated by Dr. Newman Nickels, Ph.D.  This note reflects time spent with the psychometrician and does not include test scores or any clinical interpretations made by Dr. Milbert Coulter. The full report will follow in a separate note.

## 2023-01-15 ENCOUNTER — Encounter: Payer: Medicare HMO | Admitting: Psychology

## 2023-02-10 ENCOUNTER — Ambulatory Visit: Payer: Medicare HMO | Admitting: Psychology

## 2023-02-10 DIAGNOSIS — G3184 Mild cognitive impairment, so stated: Secondary | ICD-10-CM

## 2023-02-10 NOTE — Progress Notes (Signed)
   Neuropsychology Feedback Session Wyatt Sandoval. [facility] Department of Neurology  Reason for Referral:   Wyatt Sandoval is a 62 y.o. right-handed African-American male referred by Marlowe Kays, PA-C, to characterize his current cognitive functioning and assist with diagnostic clarity and treatment planning in the context of subjective cognitive decline.   Feedback:   Wyatt Sandoval completed a comprehensive neuropsychological evaluation on 01/08/2023. Please refer to that encounter for the full report and recommendations. Briefly, scores across stand-alone and embedded performance validity measures were variable. I do not believe that there were attempts to perform poorly or evidence for poor engagement throughout testing. Rather, Wyatt Sandoval appeared quite anxious and overwhelmed at times, to the extent that the testing battery had to be adjusted to ensure a completed battery could be obtained given concerns for testing discontinuation. The acute and significant nature of these experiences could explain variable validity concerns. If taken at face value, Wyatt Sandoval pattern of performance is suggestive of an isolated impairment surrounding executive functioning. Additionally, notable performance variability was exhibited across nearly all assessed cognitive domains. This included processing speed, attention/concentration, phonemic fluency, visuospatial abilities, and all aspects of learning and memory. Developmentally, Wyatt Sandoval described longstanding academic difficulties and regular enrollment in special education courses prior to leaving school after completing the 10th grade. There is a somewhat remote history of what is likely prominent alcohol dependence. As stated above, acute anxiety appeared quite elevated throughout the testing process. While he denied anxiety during the current interview, he has acknowledged that anxiety will worsen day-to-day cognitive functioning to other  medical providers recently. He reported acute levels of mild depression across related questionnaires. He also reported ongoing sleep dysfunction during interview and across a related questionnaire. Furthermore, his most recent neuroimaging revealed mild microvascular ischemic disease and potential lacunar infarctions within the bilateral caudate nuclei. The combination of these factors remain a plausible explanation for both performance variability across testing and subjective day-to-day difficulties. This would appear more likely than a purely neurological cause at the present time.   Wyatt Sandoval was unaccompanied during the current feedback session. Content of the current session focused on the results of his neuropsychological evaluation. Wyatt Sandoval was given the opportunity to ask questions and his questions were answered. He was encouraged to reach out should additional questions arise. A copy of his report was provided at the conclusion of the visit.      One unit [phone] was billed for Dr. Tammy Sours time spent preparing for, conducting, and documenting the current feedback session with Wyatt Sandoval.

## 2023-02-16 ENCOUNTER — Other Ambulatory Visit: Payer: Self-pay | Admitting: Physician Assistant

## 2023-02-16 DIAGNOSIS — N23 Unspecified renal colic: Secondary | ICD-10-CM

## 2023-02-22 ENCOUNTER — Ambulatory Visit
Admission: RE | Admit: 2023-02-22 | Discharge: 2023-02-22 | Disposition: A | Discharge status: Home / Self Care | Payer: Medicare HMO | Source: Ambulatory Visit | Attending: Physician Assistant | Admitting: Physician Assistant

## 2023-02-22 DIAGNOSIS — N23 Unspecified renal colic: Secondary | ICD-10-CM

## 2023-02-26 ENCOUNTER — Other Ambulatory Visit: Payer: Self-pay | Admitting: Internal Medicine

## 2023-03-14 NOTE — Progress Notes (Deleted)
Cardiology Office Note   Date:  03/14/2023   ID:  Wyatt Sandoval, DOB Aug 24, 1960, MRN [id number]  PCP:  Wyatt Smoker, PA  Cardiologist:   Wyatt Pates, MD    Pt presents for f/u of HTN     HPI: Wyatt Sandoval is a 62 y.o. male with history of  DM, HTN, HLD,  CV dz (s/p left carotid endarterectomy).  He presented to clinic in past with complaints of dizziness / orthostatic symtpoms   Found to be severly orthostatic in past  Meds changed; ACE I stopped      Pt had a cardiac cath in 2017 which showed minimal CAD   I saw the pt in clinic in  March 2023   His BP was a little high   I recomm he increase enalopril to 5 mg    He was seen in HTN clinic by PharmD   BP controlled    The pt called in in early Aug.   BP was high again 150s/    He complained of some dizziness when getting out of car. Today he brings in his cuff with readings   They are all over   120s to 170/ 90s to 100s.      He has had some dizziness whn BP high      I saw the pt in Sept 2023   No outpatient medications have been marked as taking for the 03/15/23 encounter (Appointment) with Wyatt Riffle, MD.     Allergies:   Bupropion   Past Medical History:  Diagnosis Date   Bilateral carotid artery disease 10/01/2015   L-CEA 11/07/2015 > 07/27/2016, 1-39% R-ICA stenosis and no left ICA stenosis > 08/20/2017 L-ICA 40-59% stenosis and R-ICA unchanged 1-39%     Carpal tunnel syndrome, bilateral 02/17/2011   Chronic back pain 09/15/2010   Seen by pain Clinic in [facility]     Chronic obstructive pulmonary disease 05/09/2020   Contact dermatitis 02/14/2015   Diabetic neuropathy 08/09/2020   Dizziness 08/24/2017   Dyshidrotic eczema 07/08/2012   Essential hypertension 02/12/2012   Gallbladder polyp 03/13/2013   4 mm polyp on gallbladder ultrasound September 2014, repeat ultrasound September 2015     Gastroesophageal reflux disease 11/07/2018   Hepatitis C    Insomnia    Joint pain 11/07/2018    Left-sided carotid artery disease    Mild cognitive impairment of uncertain or unknown etiology 01/08/2023   Mixed hyperlipidemia 07/01/2006   Open-angle glaucoma 05/11/2013   Bilateral eyes. Mild (new). Lumigan 0.01% 1 drop HS bilateral eyes. Check up every 6 months. Managed by Dr. Fawn Kirk.  Nuclear sclerosis Cataract OU (stable)  Dependent DM OU-- Stable     Skin lesion of left arm 11/23/2016   Type 2 diabetes mellitus 07/01/2006   Wears glasses     Past Surgical History:  Procedure Laterality Date   ANTERIOR CERVICAL DECOMP/DISCECTOMY FUSION  2001; 2002   BACK SURGERY     CARDIAC CATHETERIZATION N/A 08/26/2015   Procedure: Left Heart Cath and Coronary Angiography;  Surgeon: Corky Crafts, MD;  Location: [facility] INVASIVE CV LAB;  Service: Cardiovascular;  Laterality: N/A;   CAROTID ENDARTERECTOMY Left 11/07/2015   CARPAL TUNNEL RELEASE Right ~ 2015   COLONOSCOPY     ENDARTERECTOMY Left 11/07/2015   Procedure: LEFT CAROTID ENDARTERECTOMY ;  Surgeon: Nada Libman, MD;  Location: [facility] OR;  Service: Vascular;  Laterality: Left;     Social History:  The patient  reports that he quit smoking about 5 years ago. His smoking use included cigarettes. He started smoking about 44 years ago. He has a 19.2 pack-year smoking history. He has never used smokeless tobacco. He reports that he does not currently use alcohol. He reports that he does not use drugs.   Family History:  The patient's family history includes Dementia in his mother; Heart attack in his maternal grandfather and maternal grandmother; Heart disease in his sister; Hypertension in his father and mother; Liver cancer in his brother.    ROS:  Please see the history of present illness. All other systems are reviewed and  Negative to the above problem except as noted.    PHYSICAL EXAM: VS:  There were no vitals taken for this visit.   Orthostatic BP /P  Laying   112/70   P 88   Sitting   106/68   P 94   Stanidng   98/56   P 96    Standing 4 min 100/70  P 94   GEN: Obese  62 yo  in no acute distress  HEENT: normal  Neck: JVP is normal  No carotid bruit Cardiac: RRR; no murmurs  No LE edema  Respiratory:  clear to auscultation bilaterally, GI: soft, nontender, nondistended, + BS   MS: no deformity Moving all extremities   Skin: warm and dry, no rash Neuro:  Strength and sensation are intact Psych: euthymic mood, full affect   EKG:  EKG is not ordered today.    Carotid USN   march 2023  Summary: Right Carotid: Velocities in the right ICA are consistent with a 1-39% stenosis. Left Carotid: Widely patent LICA without evidence of stenosis, s/p endarterectomy with patch angioplasty. Vertebrals: Bilateral vertebral arteries demonstrate antegrade flow. Subclavians: Right subclavian artery flow was disturbed. Normal flow hemodynamics were seen in the left subclavian artery. *See table(s) above for measurements and observations. Suggest follow up study in 12 months.   L heart cath   08/2015  Mid LAD lesion, 25% stenosed. Nonobstructive CAD. The left ventricular systolic function is normal. Short aortic arch making catheter torquing, particularly for the left coronary artery, difficult. Moderately elevated LVEDP.   Continue aggressive preventive therapy.  Continue to avoid tobacco.  I encouraged a healthy diet, DM control and weight control.    May need some diuretics for elevated LVEDP.  He will f/u with Dr. Tenny Craw.   Lipid Panel    Component Value Date/Time   CHOL 155 01/06/2022 1637   TRIG 284 (H) 01/06/2022 1637   HDL 50 01/06/2022 1637   CHOLHDL 3.1 01/06/2022 1637   CHOLHDL 2.8 10/25/2015 1049   VLDL 23 10/25/2015 1049   LDLCALC 60 01/06/2022 1637      Wt Readings from Last 3 Encounters:  09/29/22 202 lb (91.6 kg)  03/19/22 196 lb (88.9 kg)  03/12/22 196 lb (88.9 kg)      ASSESSMENT AND PLAN:  1   HTN  BP is very labile   Today it is low   he has had some dizziness  he does not meat  criteria for orthostatic hypotension   Claims he is drinking water  Reocmm:   Keep on current regimen   Will give Rx for clonidine 0.1 prn for BP greater than 165/95   Will set up for renal artery USN    Will need close follow up    2   CV dz   S/p CEA  Follow periodic carotid USNs 3  Lipids   Will get labs from  Ramiro Harvest (PCP)  4   DM   On oral agents   A1C 6.9    Limit carbs  5  Tob  Patient says he is down to one cigarette  Will Rx  Chantix    Today check:  CBC, BMET, lipids, TSH and PSA (strong FHx of prostate CA)  Current medicines are reviewed at length with the patient today.  The patient does not have concerns regarding medicines.  Signed, Wyatt Pates, MD  03/14/2023 10:07 PM    [facility] [address] [place], [place], [place]  [address] Phone: [phone]; Fax: [phone]

## 2023-03-15 ENCOUNTER — Ambulatory Visit: Payer: Medicare HMO | Admitting: Internal Medicine

## 2023-03-16 ENCOUNTER — Encounter: Payer: Self-pay | Admitting: Physician Assistant

## 2023-03-16 ENCOUNTER — Ambulatory Visit: Payer: Medicare HMO | Admitting: Physician Assistant

## 2023-03-16 VITALS — BP 109/73 | HR 94 | Resp 20 | Ht 67.0 in | Wt 197.0 lb

## 2023-03-16 DIAGNOSIS — G3184 Mild cognitive impairment, so stated: Secondary | ICD-10-CM | POA: Diagnosis not present

## 2023-03-16 MED ORDER — DONEPEZIL HCL 5 MG PO TABS
5.0000 mg | ORAL_TABLET | Freq: Every morning | ORAL | 11 refills | Status: DC
Start: 2023-03-16 — End: 2024-03-15

## 2023-03-16 NOTE — Progress Notes (Signed)
Assessment/Plan:   Mild Cognitive Impairment of uncertain or unknown etiology   Wyatt Sandoval is a very pleasant 62 y.o. RH male with a history of labile hypertension, hyperlipidemia, DM2,  CAD, ASCVD s/p L CEA (RICAs 1-39%), Hep C (treated 2018), prior alcohol and tobacco abuse, anxiety, depression and a diagnosis of mild cognitive impairment  of uncertain or unknown etiology, perhaps with a vascular component, but unlikely due to neurodegenerative process, per neuropsych evaluation. He is presenting today in follow-up for evaluation of memory concerns. He denies any worsening of memory.  Mood is good. He has missed his [facility] appointments, discussed the importance of maintaining a normal BP and its role in memory. Patient is not on antidementia meds discussed starting low dose donepezil, he agrees.       Recommendations:   Follow up 1 year Start Donepezil 5mg  daily. Side effects discussed   Recommend psychotherapy for anxiety Recommend good control of cardiovascular risk factors. Continue baby ASA daily Continue B1, B12 replenishment Continue pain control at pain clinic, monitor medicines give as this could affect memory Continue to control mood meds as per PCP Follow up with Cards for symptomatic OH and labile HTN    Subjective:   This patient is here alone. Previous records as well as any outside records available were reviewed prior to todays visit.   Patient was last seen on 09/09/22.       Any changes in memory since last visit? "About the same". He is able to retain parts of the conversation, not al of it.  Sometimes high anxiety interferes with it. repeats oneself?  Endorsed "but not much".  Disoriented when walking into a room? Denies Misplacing objects?  Patient denies   Wandering behavior?   denies   Any personality changes since last visit?  Getting married in December, "I am a little bit anxious".  Continues to have moments of irritability and anxiety.  Any worsening  depression?: denies   Hallucinations or paranoia?  Denies.   Seizures?   Denies.    Any sleep changes? Sleeps well  Denies vivid dreams, REM behavior or sleepwalking   Sleep apnea?   Denies. Any hygiene concerns?   Denies.   Independent of bathing and dressing?  Endorsed  Does the patient needs help with medications? Patient is in charge   Who is in charge of the finances?  Patient is in charge     Any changes in appetite?  Denies.     Patient have trouble swallowing?  denies   Does the patient cook? Yes   Any kitchen accidents such as leaving the stove on?   denies   Any headaches?  He has chronic tension headaches especially when BP is high.   Vision changes? denies Chronic pain?  denies   Ambulates with difficulty? Denies.    Recent falls or head injuries?  Denies.      Unilateral weakness, numbness or tingling?   denies   Any tremors?  Denies.   Any anosmia?    denies   Any incontinence of urine?  Denies.   Any bowel dysfunction?  Denies.      Patient lives with fiancee Alcohol? Quit completely "after the black out, got me scared " in Oct 2023    Tobacco?  Decreasing to 1 a day    Does the patient drive?yes, denies any issues.   Works as a Engineer, water 3 times a week.    Initial evaluation 03/12/2022  How long did patient have  memory difficulties? About 5 years when he began forgetting how to do tasks, needs "to be re-instructed to do something".  He reports both short-term and long-term memory difficulties.  He also is able to remember parts of the conversation, but not all of it.   He also reports that high anxiety interferes with his thinking. repeats oneself?  "Not so much " Disoriented when walking into a room?  Patient denies   Leaving objects in unusual places?  "I don't remember where I put stuff, but not in unusual places ""sometimes I lose my wallet and I find it 2 days later " Ambulates  with difficulty?  Sometimes, due to [facility]  Recent falls?  Patient denies   Any head  injuries?  Patient denies   History of seizures?   Patient denies   Wandering behavior?  Patient denies                                    Patient drives?   Occasionally he gets lost, needs more than before his GPS. Any mood changes such irritability agitation? "A little bit" Admits to being "too mad" sometimes.  If somebody has a superior degree or jaw, he becomes very anxious and "lose my memory" Any history of depression?:  Patient denies although "everybody gets a little down sometimes " Hallucinations?  Patient denies   Paranoia? "A little bit, sometimes I am afraid my wallet would be stolen" Patient reports that has insomnia, and takes 3 medications to help with sleep.  He had a recent sleep study, with "borderline sleep apnea ", he reports that does not require CPAP.  He denies any vivid dreams, REM behavior or sleepwalking.  History of sleep apnea? "Borderline sleep apnea"  Any hygiene concerns?  Patient denies   Independent of bathing and dressing?  Endorsed Does the patient needs help with medications? Patient in charge   Who is in charge of the finances?  Patient is in charge  Any changes in appetite?  Patient denies    Patient have trouble swallowing? Patient denies   Does the patient cook?  Patient denies   Any kitchen accidents such as leaving the stove on? Patient denies   Any headaches?  Endorsed, chronic, tension type, worse with higher BP.  The double vision? Patient denies   Any focal numbness or tingling?  Patient denies   Chronic back pain endorsed, chronic lower back pain from prior work accidents in the 1990s, followed at the pain clinic.  Unilateral weakness?  Patient denies   Any tremors?  Patient denies   Any history of anosmia?  Patient denies   Any incontinence of urine?  Patient denies   Any bowel dysfunction?  Constipation due to pain pills  History of heavy alcohol intake? Gets drunk once a week "a fifth, sometimes more and black out, some liquor during the  week ".  He is thinking about rejoining AA    History of heavy tobacco use?  Endorsed, trying to quit. 1 cig a day  Family history of dementia? Mother has dementia of unknown type Patient lives alone   Pertinent labs: TSH 1.12     MRI of the brain 03/18/2022  personally reviewed, remarkable for  chronic small vessel ischemia, prominent perivascular spaces versus small chronic lacunar infarcts within the bilateral caudate nuclei.       Neuropsych evaluation 01/2023 Briefly, scores across stand-alone and embedded performance  validity measures were variable. I do not believe that there were attempts to perform poorly or evidence for poor engagement throughout testing. Rather, Mr. Mcglory appeared quite anxious and overwhelmed at times, to the extent that the testing battery had to be adjusted to ensure a completed battery could be obtained given concerns for testing discontinuation. The acute and significant nature of these experiences could explain variable validity concerns. If taken at face value, Mr. Szalkowski pattern of performance is suggestive of an isolated impairment surrounding executive functioning. Additionally, notable performance variability was exhibited across nearly all assessed cognitive domains. This included processing speed, attention/concentration, phonemic fluency, visuospatial abilities, and all aspects of learning and memory. Developmentally, Mr. Buchanan described longstanding academic difficulties and regular enrollment in special education courses prior to leaving school after completing the 10th grade. There is a somewhat remote history of what is likely prominent alcohol dependence. As stated above, acute anxiety appeared quite elevated throughout the testing process. While he denied anxiety during the current interview, he has acknowledged that anxiety will worsen day-to-day cognitive functioning to other medical providers recently. He reported acute levels of mild depression  across related questionnaires. He also reported ongoing sleep dysfunction during interview and across a related questionnaire. Furthermore, his most recent neuroimaging revealed mild microvascular ischemic disease and potential lacunar infarctions within the bilateral caudate nuclei. The combination of these factors remain a plausible explanation for both performance variability across testing and subjective day-to-day difficulties. This would appear more likely than a purely neurological cause at the present time.    Past Medical History:  Diagnosis Date   Bilateral carotid artery disease 10/01/2015   L-CEA 11/07/2015 > 07/27/2016, 1-39% R-ICA stenosis and no left ICA stenosis > 08/20/2017 L-ICA 40-59% stenosis and R-ICA unchanged 1-39%     Carpal tunnel syndrome, bilateral 02/17/2011   Chronic back pain 09/15/2010   Seen by pain Clinic in [facility]     Chronic obstructive pulmonary disease 05/09/2020   Contact dermatitis 02/14/2015   Diabetic neuropathy 08/09/2020   Dizziness 08/24/2017   Dyshidrotic eczema 07/08/2012   Essential hypertension 02/12/2012   Gallbladder polyp 03/13/2013   4 mm polyp on gallbladder ultrasound September 2014, repeat ultrasound September 2015     Gastroesophageal reflux disease 11/07/2018   Hepatitis C    Insomnia    Joint pain 11/07/2018   Left-sided carotid artery disease    Mild cognitive impairment of uncertain or unknown etiology 01/08/2023   Mixed hyperlipidemia 07/01/2006   Open-angle glaucoma 05/11/2013   Bilateral eyes. Mild (new). Lumigan 0.01% 1 drop HS bilateral eyes. Check up every 6 months. Managed by Dr. Fawn Kirk.  Nuclear sclerosis Cataract OU (stable)  Dependent DM OU-- Stable     Skin lesion of left arm 11/23/2016   Type 2 diabetes mellitus 07/01/2006   Wears glasses      Past Surgical History:  Procedure Laterality Date   ANTERIOR CERVICAL DECOMP/DISCECTOMY FUSION  2001; 2002   BACK SURGERY     CARDIAC CATHETERIZATION N/A  08/26/2015   Procedure: Left Heart Cath and Coronary Angiography;  Surgeon: Corky Crafts, MD;  Location: [facility] INVASIVE CV LAB;  Service: Cardiovascular;  Laterality: N/A;   CAROTID ENDARTERECTOMY Left 11/07/2015   CARPAL TUNNEL RELEASE Right ~ 2015   COLONOSCOPY     ENDARTERECTOMY Left 11/07/2015   Procedure: LEFT CAROTID ENDARTERECTOMY ;  Surgeon: Nada Libman, MD;  Location: [facility] OR;  Service: Vascular;  Laterality: Left;     PREVIOUS MEDICATIONS:   CURRENT  MEDICATIONS:  Outpatient Encounter Medications as of 03/16/2023  Medication Sig   albuterol (VENTOLIN HFA) 108 (90 Base) MCG/ACT inhaler 2 puffs every 4 (four) hours as needed for wheezing or shortness of breath.   amitriptyline (ELAVIL) 150 MG tablet TAKE 1 TABLET(150 MG) BY MOUTH AT BEDTIME   cloNIDine (CATAPRES) 0.1 MG tablet TAKE 1 TABLET BY MOUTH AS NEEDED FOR BLOOD PRESSURE GREATER THAN 165/95   donepezil (ARICEPT) 5 MG tablet Take 1 tablet (5 mg total) by mouth every morning.   enalapril (VASOTEC) 5 MG tablet TAKE 1 TABLET(5 MG) BY MOUTH DAILY   gabapentin (NEURONTIN) 300 MG capsule Take 1 capsule (300 mg total) by mouth daily.   hydrOXYzine (ATARAX) 10 MG tablet hydroxyzine HCl 10 mg tablet  TAKE 1 TABLET BY MOUTH EVERY DAY   metFORMIN (GLUCOPHAGE) 1000 MG tablet Take one tablet by mouth twice daily   metoprolol tartrate (LOPRESSOR) 25 MG tablet Take 1 tablet (25 mg total) by mouth 2 (two) times daily.   nitroGLYCERIN (NITROSTAT) 0.4 MG SL tablet DISSOLVE 1 TABLET UNDER THE TONGUE EVERY 5 MINUTES AS NEEDED FOR CHEST PAIN. TAKE UP TO 3 DOSES AS DIRECTED   omeprazole (PRILOSEC) 40 MG capsule Take 1 capsule (40 mg total) by mouth daily.   oxyCODONE (ROXICODONE) 15 MG immediate release tablet Take 1 tablet (15 mg total) by mouth every 4 (four) hours as needed for pain.   rosuvastatin (CRESTOR) 20 MG tablet TAKE 1 TABLET(20 MG) BY MOUTH DAILY   traZODone (DESYREL) 100 MG tablet Take 100 mg by mouth at bedtime.   triamcinolone  cream (KENALOG) 0.5 % Apply 1 application topically 2 (two) times daily.   varenicline (CHANTIX) 1 MG tablet TAKE 1 TABLET(1 MG) BY MOUTH TWICE DAILY WITH A MEAL   No facility-administered encounter medications on file as of 03/16/2023.     Objective:     PHYSICAL EXAMINATION:    VITALS:   Vitals:   03/16/23 0853  BP: 109/73  Pulse: 94  Resp: 20  SpO2: 98%  Weight: 197 lb (89.4 kg)  Height: 5\' 7"  (1.702 m)    GEN:  The patient appears stated age and is in NAD. HEENT:  Normocephalic, atraumatic.   Neurological examination:  General: NAD, well-groomed, appears stated age. Orientation: The patient is alert. Anxious appearing. Oriented to person, place and date Cranial nerves: There is good facial symmetry.The speech is fluent and clear. No aphasia or dysarthria. Fund of knowledge is appropriate. Recent memory impaired and remote memory is normal.  Attention and concentration are normal.  Able to name objects and repeat phrases.  Hearing is intact to conversational tone.  Sensation: Sensation is intact to light touch throughout Motor: Strength is at least antigravity x4. DTR's 2/4 in UE/LE      03/12/2022   10:00 AM  Montreal Cognitive Assessment   Visuospatial/ Executive (0/5) 2  Naming (0/3) 3  Attention: Read list of digits (0/2) 2  Attention: Read list of letters (0/1) 1  Attention: Serial 7 subtraction starting at 100 (0/3) 1  Language: Repeat phrase (0/2) 0  Language : Fluency (0/1) 1  Abstraction (0/2) 2  Delayed Recall (0/5) 2  Orientation (0/6) 6  Total 20  Adjusted Score (based on education) 21       08/27/2010    2:00 PM  MMSE - Mini Mental State Exam  Orientation to time 5  Orientation to Place 5  Registration 3  Attention/ Calculation 5  Recall 3  Language- name 2  objects 2  Language- repeat 1  Language- follow 3 step command 3  Language- read & follow direction 1  Write a sentence 1  Copy design 1  Total score 30       Movement  examination: Tone: There is normal tone in the UE/LE Abnormal movements:  no tremor.  No myoclonus.  No asterixis.   Coordination:  There is no decremation with RAM's. Normal finger to nose  Gait and Station: The patient has no difficulty arising out of a deep-seated chair without the use of the hands. The patient's stride length is good.  Gait is cautious and narrow.   Thank you for allowing [place] the opportunity to participate in the care of this nice patient. Please do not hesitate to contact us for any questions or concerns.   Total time spent on today's visit was 27 minutes dedicated to this patient today, preparing to see patient, examining the patient, ordering tests and/or medications and counseling the patient, documenting clinical information in the EHR or other health record, independently interpreting results and communicating results to the patient/family, discussing treatment and goals, answering patient's questions and coordinating care.  Cc:  [name], PA  [facility] 03/16/2023 11:16 AM

## 2023-03-16 NOTE — Patient Instructions (Addendum)
It was a pleasure to see you today at our office.   Recommendations:   Continue B12, B1 daily, follow up with the primary Continue baby aspirin daily  donepezil 5 mg daily for memory  Talk with your doctor about omeprazole, to change it for other reflux medicine as it may contribute to memory loss Consider psychotherapy for anxiety  Follow up with Cardiology for orthostatic hypotension and there blood pressure issues  Continue attendance to pain clinic for chronic pain. multiple muscle relaxers and pain meds which it could affect memory   Whom to call:  Memory  decline, memory medications: Call our office [phone]   For psychiatric meds, mood meds: Please have your primary care physician manage these medications.    If you have any severe symptoms of a stroke, or other severe issues such as confusion,severe chills or fever, etc call 911 or go to the ER as you may need to be evaluated further   Feel free to visit Facebook page " [email or web address]" for tips of how to care for people with memory problems.      RECOMMENDATIONS FOR ALL PATIENTS WITH MEMORY PROBLEMS: 1. Continue to exercise (Recommend 30 minutes of walking everyday, or 3 hours every week) 2. Increase social interactions - continue going to [place] and enjoy social gatherings with friends and family 3. Eat healthy, avoid fried foods and eat more fruits and vegetables 4. Maintain adequate blood pressure, blood sugar, and blood cholesterol level. Reducing the risk of stroke and cardiovascular disease also helps promoting better memory. 5. Avoid stressful situations. Live a simple life and avoid aggravations. Organize your time and prepare for the next day in anticipation. 6. Sleep well, avoid any interruptions of sleep and avoid any distractions in the bedroom that may interfere with adequate sleep quality 7. Avoid sugar, avoid sweets as there is a strong link between excessive sugar intake, diabetes, and cognitive impairment We  discussed the Mediterranean diet, which has been shown to help patients reduce the risk of progressive memory disorders and reduces cardiovascular risk. This includes eating fish, eat fruits and green leafy vegetables, nuts like almonds and hazelnuts, walnuts, and also use olive oil. Avoid fast foods and fried foods as much as possible. Avoid sweets and sugar as sugar use has been linked to worsening of memory function.  There is always a concern of gradual progression of memory problems. If this is the case, then we may need to adjust level of care according to patient needs. Support, both to the patient and caregiver, should then be put into place.        FALL PRECAUTIONS: Be cautious when walking. Scan the area for obstacles that may increase the risk of trips and falls. When getting up in the mornings, sit up at the edge of the bed for a few minutes before getting out of bed. Consider elevating the bed at the head end to avoid drop of blood pressure when getting up. Walk always in a well-lit room (use night lights in the walls). Avoid area rugs or power cords from appliances in the middle of the walkways. Use a walker or a cane if necessary and consider physical therapy for balance exercise. Get your eyesight checked regularly.  FINANCIAL OVERSIGHT: Supervision, especially oversight when making financial decisions or transactions is also recommended.  HOME SAFETY: Consider the safety of the kitchen when operating appliances like stoves, microwave oven, and blender. Consider having supervision and share cooking responsibilities until no longer able to participate in  those. Accidents with firearms and other hazards in the house should be identified and addressed as well.   ABILITY TO BE LEFT ALONE: If patient is unable to contact 911 operator, consider using LifeLine, or when the need is there, arrange for someone to stay with patients. Smoking is a fire hazard, consider supervision or cessation.  Risk of wandering should be assessed by caregiver and if detected at any point, supervision and safe proof recommendations should be instituted.  MEDICATION SUPERVISION: Inability to self-administer medication needs to be constantly addressed. Implement a mechanism to ensure safe administration of the medications.   DRIVING: Regarding driving, in patients with progressive memory problems, driving will be impaired. We advise to have someone else do the driving if trouble finding directions or if minor accidents are reported. Independent driving assessment is available to determine safety of driving.   If you are interested in the driving assessment, you can contact the following:  [employer] in [place] [phone]  [facility] [phone]  [facility] [phone] or [phone]    Mediterranean Diet A Mediterranean diet refers to food and lifestyle choices that are based on the traditions of countries located on the [employer]. This way of eating has been shown to help prevent certain conditions and improve outcomes for people who have chronic diseases, like kidney disease and heart disease. What are tips for following this plan? Lifestyle  Cook and eat meals together with your family, when possible. Drink enough fluid to keep your urine clear or pale yellow. Be physically active every day. This includes: Aerobic exercise like running or swimming. Leisure activities like gardening, walking, or housework. Get 7-8 hours of sleep each night. If recommended by your health care provider, drink red wine in moderation. This means 1 glass a day for nonpregnant women and 2 glasses a day for men. A glass of wine equals 5 oz (150 mL). Reading food labels  Check the serving size of packaged foods. For foods such as rice and pasta, the serving size refers to the amount of cooked product, not dry. Check the  total fat in packaged foods. Avoid foods that have saturated fat or trans fats. Check the ingredients list for added sugars, such as corn syrup. Shopping  At the grocery store, buy most of your food from the areas near the walls of the store. This includes: Fresh fruits and vegetables (produce). Grains, beans, nuts, and seeds. Some of these may be available in unpackaged forms or large amounts (in bulk). Fresh seafood. Poultry and eggs. Low-fat dairy products. Buy whole ingredients instead of prepackaged foods. Buy fresh fruits and vegetables in-season from local farmers markets. Buy frozen fruits and vegetables in resealable bags. If you do not have access to quality fresh seafood, buy precooked frozen shrimp or canned fish, such as tuna, salmon, or sardines. Buy small amounts of raw or cooked vegetables, salads, or olives from the deli or salad bar at your store. Stock your pantry so you always have certain foods on hand, such as olive oil, canned tuna, canned tomatoes, rice, pasta, and beans. Cooking  Cook foods with extra-virgin olive oil instead of using butter or other vegetable oils. Have meat as a side dish, and have vegetables or grains as your main dish. This means having meat in small portions or adding small amounts of meat to foods like pasta or stew. Use beans or vegetables instead of meat in common dishes like chili or lasagna.  Experiment with different cooking methods. Try roasting or broiling vegetables instead of steaming or sauteing them. Add frozen vegetables to soups, stews, pasta, or rice. Add nuts or seeds for added healthy fat at each meal. You can add these to yogurt, salads, or vegetable dishes. Marinate fish or vegetables using olive oil, lemon juice, garlic, and fresh herbs. Meal planning  Plan to eat 1 vegetarian meal one day each week. Try to work up to 2 vegetarian meals, if possible. Eat seafood 2 or more times a week. Have healthy snacks readily available,  such as: Vegetable sticks with hummus. Greek yogurt. Fruit and nut trail mix. Eat balanced meals throughout the week. This includes: Fruit: 2-3 servings a day Vegetables: 4-5 servings a day Low-fat dairy: 2 servings a day Fish, poultry, or lean meat: 1 serving a day Beans and legumes: 2 or more servings a week Nuts and seeds: 1-2 servings a day Whole grains: 6-8 servings a day Extra-virgin olive oil: 3-4 servings a day Limit red meat and sweets to only a few servings a month What are my food choices? Mediterranean diet Recommended Grains: Whole-grain pasta. Brown rice. Bulgar wheat. Polenta. Couscous. Whole-wheat bread. Orpah Cobb. Vegetables: Artichokes. Beets. Broccoli. Cabbage. Carrots. Eggplant. Green beans. Chard. Kale. Spinach. Onions. Leeks. Peas. Squash. Tomatoes. Peppers. Radishes. Fruits: Apples. Apricots. Avocado. Berries. Bananas. Cherries. Dates. Figs. Grapes. Lemons. Melon. Oranges. Peaches. Plums. Pomegranate. Meats and other protein foods: Beans. Almonds. Sunflower seeds. Pine nuts. Peanuts. Cod. Salmon. Scallops. Shrimp. Tuna. Tilapia. Clams. Oysters. Eggs. Dairy: Low-fat milk. Cheese. Greek yogurt. Beverages: Water. Red wine. Herbal tea. Fats and oils: Extra virgin olive oil. Avocado oil. Grape seed oil. Sweets and desserts: [place] yogurt with honey. Baked apples. Poached pears. Trail mix. Seasoning and other foods: Basil. Cilantro. Coriander. Cumin. Mint. Parsley. Sage. Rosemary. Tarragon. Garlic. Oregano. Thyme. Pepper. Balsalmic vinegar. Tahini. Hummus. Tomato sauce. Olives. Mushrooms. Limit these Grains: Prepackaged pasta or rice dishes. Prepackaged cereal with added sugar. Vegetables: Deep fried potatoes (french fries). Fruits: Fruit canned in syrup. Meats and other protein foods: Beef. Pork. Lamb. Poultry with skin. Hot dogs. Tomasa Blase. Dairy: Ice cream. Sour cream. Whole milk. Beverages: Juice. Sugar-sweetened soft drinks. Beer. Liquor and spirits. Fats and  oils: Butter. Canola oil. Vegetable oil. Beef fat (tallow). Lard. Sweets and desserts: Cookies. Cakes. Pies. Candy. Seasoning and other foods: Mayonnaise. Premade sauces and marinades. The items listed may not be a complete list. Talk with your dietitian about what dietary choices are right for you. Summary The Mediterranean diet includes both food and lifestyle choices. Eat a variety of fresh fruits and vegetables, beans, nuts, seeds, and whole grains. Limit the amount of red meat and sweets that you eat. Talk with your health care provider about whether it is safe for you to drink red wine in moderation. This means 1 glass a day for nonpregnant women and 2 glasses a day for men. A glass of wine equals 5 oz (150 mL). This information is not intended to replace advice given to you by your health care provider. Make sure you discuss any questions you have with your health care provider. Document Released: 12/12/2015 Document Revised: 01/14/2016 Document Reviewed: 12/12/2015 Elsevier Interactive Patient Education  2017 [employer].   Your provider has requested that you have labwork completed today. Please go to [facility] ([id number]) on the second floor of this building before leaving the office today. You do not need to check in. If you are not called within 15 minutes please check with  the front desk.

## 2023-03-25 ENCOUNTER — Encounter: Payer: Self-pay | Admitting: Internal Medicine

## 2023-03-25 LAB — LAB REPORT - SCANNED
A1c: 6.8
EGFR: 67

## 2023-04-17 NOTE — Progress Notes (Signed)
Cardiology Office Note   Date:  04/26/2023   ID:  Wyatt Sandoval, DOB 11/13/1960, MRN [id number]  PCP:  [name], PA  Cardiologist:   Dietrich Pates, MD    Pt presents for f/u of HTN     HPI: Wyatt Sandoval is a 62 y.o. male with history of  DM, HTN, orthstatic hypotension, HL,  CV dz (s/p left carotid endarterectomy).   Pt had a cardiac cath in 2017 which showed minimal CAD   I saw the pt in clinic in  March 2023   His BP was a little high   I recomm he increase enalopril to 5 mg    He was seen in HTN clinic by PharmD   BP controlled   The pt called in in early Aug.   BP was high again 150s/    He complained of some dizziness when getting out of car.  I saw the pt in 2023  Since seen he says his BP has been good   He has not taken a clonidine   Dizzy at times  Has a Dexcom monitor     Does have swings in glu reading     Denies CP  Breathing is OK   Walks about 6000 steps 3x per week  2cigs per week   Rare EtOH   Current Meds  Medication Sig   albuterol (VENTOLIN HFA) 108 (90 Base) MCG/ACT inhaler 2 puffs every 4 (four) hours as needed for wheezing or shortness of breath.   amitriptyline (ELAVIL) 150 MG tablet TAKE 1 TABLET(150 MG) BY MOUTH AT BEDTIME   cloNIDine (CATAPRES) 0.1 MG tablet TAKE 1 TABLET BY MOUTH AS NEEDED FOR BLOOD PRESSURE GREATER THAN 165/95   donepezil (ARICEPT) 5 MG tablet Take 1 tablet (5 mg total) by mouth every morning.   enalapril (VASOTEC) 5 MG tablet TAKE 1 TABLET(5 MG) BY MOUTH DAILY   gabapentin (NEURONTIN) 300 MG capsule Take 1 capsule (300 mg total) by mouth daily.   hydrOXYzine (ATARAX) 10 MG tablet hydroxyzine HCl 10 mg tablet  TAKE 1 TABLET BY MOUTH EVERY DAY   metFORMIN (GLUCOPHAGE) 1000 MG tablet Take one tablet by mouth twice daily   metoprolol tartrate (LOPRESSOR) 25 MG tablet Take 1 tablet (25 mg total) by mouth 2 (two) times daily.   nitroGLYCERIN (NITROSTAT) 0.4 MG SL tablet DISSOLVE 1 TABLET UNDER THE TONGUE EVERY 5 MINUTES AS  NEEDED FOR CHEST PAIN. TAKE UP TO 3 DOSES AS DIRECTED   omeprazole (PRILOSEC) 40 MG capsule Take 1 capsule (40 mg total) by mouth daily.   oxyCODONE (ROXICODONE) 15 MG immediate release tablet Take 1 tablet (15 mg total) by mouth every 4 (four) hours as needed for pain.   rosuvastatin (CRESTOR) 20 MG tablet TAKE 1 TABLET(20 MG) BY MOUTH DAILY   traZODone (DESYREL) 100 MG tablet Take 100 mg by mouth at bedtime.   triamcinolone cream (KENALOG) 0.5 % Apply 1 application topically 2 (two) times daily.   [DISCONTINUED] varenicline (CHANTIX) 1 MG tablet TAKE 1 TABLET(1 MG) BY MOUTH TWICE DAILY WITH A MEAL     Allergies:   Bupropion   Past Medical History:  Diagnosis Date   Bilateral carotid artery disease 10/01/2015   L-CEA 11/07/2015 > 07/27/2016, 1-39% R-ICA stenosis and no left ICA stenosis > 08/20/2017 L-ICA 40-59% stenosis and R-ICA unchanged 1-39%     Carpal tunnel syndrome, bilateral 02/17/2011   Chronic back pain 09/15/2010   Seen by pain Clinic [place]     Chronic obstructive pulmonary disease 05/09/2020   Contact dermatitis 02/14/2015   Diabetic neuropathy 08/09/2020   Dizziness 08/24/2017   Dyshidrotic eczema 07/08/2012   Essential hypertension 02/12/2012   Gallbladder polyp 03/13/2013   4 mm polyp on gallbladder ultrasound September 2014, repeat ultrasound September 2015     Gastroesophageal reflux disease 11/07/2018   Hepatitis C    Insomnia    Joint pain 11/07/2018   Left-sided carotid artery disease    Mild cognitive impairment of uncertain or unknown etiology 01/08/2023   Mixed hyperlipidemia 07/01/2006   Open-angle glaucoma 05/11/2013   Bilateral eyes. Mild (new). Lumigan 0.01% 1 drop HS bilateral eyes. Check up every 6 months. Managed by Dr. Fawn Kirk.  Nuclear sclerosis Cataract OU (stable)  Dependent DM OU-- Stable     Skin lesion of left arm 11/23/2016   Type 2 diabetes mellitus 07/01/2006   Wears glasses     Past Surgical History:  Procedure  Laterality Date   ANTERIOR CERVICAL DECOMP/DISCECTOMY FUSION  2001; 2002   BACK SURGERY     CARDIAC CATHETERIZATION N/A 08/26/2015   Procedure: Left Heart Cath and Coronary Angiography;  Surgeon: Corky Crafts, MD;  Location: [facility] INVASIVE CV LAB;  Service: Cardiovascular;  Laterality: N/A;   CAROTID ENDARTERECTOMY Left 11/07/2015   CARPAL TUNNEL RELEASE Right ~ 2015   COLONOSCOPY     ENDARTERECTOMY Left 11/07/2015   Procedure: LEFT CAROTID ENDARTERECTOMY ;  Surgeon: Nada Libman, MD;  Location[facility] OR;  Service: Vascular;  Laterality: Left;     Social History:  The patient  reports that he quit smoking about 5 years ago. His smoking use included cigarettes. He started smoking about 44 years ago. He has a 19.2 pack-year smoking history. He has never used smokeless tobacco. He reports that he does not currently use alcohol. He reports that he does not use drugs.   Family History:  The patient's family history includes Dementia in his mother; Heart attack in his maternal grandfather and maternal grandmother; Heart disease in his sister; Hypertension in his father and mother; Liver cancer in his brother.    ROS:  Please see the history of present illness. All other systems are reviewed and  Negative to the above problem except as noted.    PHYSICAL EXAM: VS:  BP 108/76   Pulse 81   Ht 5\' 7"  (1.702 m)   Wt 205 lb (93 kg)   SpO2 92%   BMI 32.11 kg/m    GEN: Obese  62 yo  in no acute distress  HEENT: normal  Neck: JVP is normal  No bruits  Cardiac: RRR; no murmur No LE edema  Respiratory:  clear to auscultation  GI: soft, nontender, no masses  MS: no deformity Moving all extremities     EKG:  EKG shows SR 76 bpm   Nonspecific ST T Wave changes    Carotid USN   Aug 2024    Color duplex indicates minimal heterogeneous and calcified plaque, with no hemodynamically significant stenosis by duplex criteria in the extracranial cerebrovascular circulation.   Left:   Surgical  changes of left carotid endarterectomy. Note that established duplex criteria have not been validated in the setting of prior carotid endarterectomy, however, there is no evidence of recurrent high-grade stenosis.   L heart cath   08/2015  Mid LAD lesion, 25% stenosed. Nonobstructive CAD. The left ventricular systolic function is normal. Short aortic arch making catheter torquing, particularly for the left  coronary artery, difficult. Moderately elevated LVEDP.   Continue aggressive preventive therapy.  Continue to avoid tobacco.  I encouraged a healthy diet, DM control and weight control.    May need some diuretics for elevated LVEDP.  He will f/u with Dr. Tenny Craw.   Lipid Panel    Component Value Date/Time   CHOL 155 01/06/2022 1637   TRIG 284 (H) 01/06/2022 1637   HDL 50 01/06/2022 1637   CHOLHDL 3.1 01/06/2022 1637   CHOLHDL 2.8 10/25/2015 1049   VLDL 23 10/25/2015 1049   LDLCALC 60 01/06/2022 1637      Wt Readings from Last 3 Encounters:  04/26/23 205 lb (93 kg)  03/16/23 197 lb (89.4 kg)  09/29/22 202 lb (91.6 kg)      ASSESSMENT AND PLAN:  1   HTN BP is well controlled   He does have some dizziness at times   I would recomm backing off of metoprolol to daily   May Dc  if still fow   Keep on 5 mg enalopril     2  CV dz   s/p L CEA   Follow with periodic USN  3  Lipids   Will get labs from PCP  LDL was 60 in Sept   trig were 284  4  DM   On Ozempic and glucophage   has a dexcom monitor    Recomm he cut back on carbs   Follow responses on Dexcom Get A1C from PCP   5  Tob  Congratulated on slowing down     Rx Chantix   Follow up in 1 year      Current medicines are reviewed at length with the patient today.  The patient does not have concerns regarding medicines.  Signed, Dietrich Pates, MD  04/26/2023 1:50 PM    [facility] [address] [place], [place], [place]  [address] Phone: [phone]; Fax: [phone]

## 2023-04-26 ENCOUNTER — Ambulatory Visit: Payer: Medicare HMO | Attending: Internal Medicine | Admitting: Internal Medicine

## 2023-04-26 VITALS — BP 108/76 | HR 81 | Ht 67.0 in | Wt 205.0 lb

## 2023-04-26 DIAGNOSIS — I10 Essential (primary) hypertension: Secondary | ICD-10-CM

## 2023-04-26 DIAGNOSIS — F17200 Nicotine dependence, unspecified, uncomplicated: Secondary | ICD-10-CM | POA: Diagnosis not present

## 2023-04-26 MED ORDER — VARENICLINE TARTRATE 1 MG PO TABS: ORAL_TABLET | ORAL | 0 refills | Status: DC

## 2023-04-26 NOTE — Patient Instructions (Signed)
Medication Instructions:  Your physician recommends that you continue on your current medications as directed. Please refer to the Current Medication list given to you today.  *If you need a refill on your cardiac medications before your next appointment, please call your pharmacy*  Lab Work: None ordered today  Testing/Procedures: None ordered today  Follow-Up: At [facility], you and your health needs are our priority.  As part of our continuing mission to provide you with exceptional heart care, we have created designated Provider Care Teams.  These Care Teams include your primary Cardiologist (physician) and Advanced Practice Providers (APPs -  Physician Assistants and Nurse Practitioners) who all work together to provide you with the care you need, when you need it.  Your next appointment:   12 month(s)  The format for your next appointment:   In Person  Provider:   Paula Ross, MD  

## 2023-05-11 ENCOUNTER — Emergency Department (HOSPITAL_COMMUNITY)
Admission: EM | Admit: 2023-05-11 | Discharge: 2023-05-12 | Disposition: A | Discharge status: Home / Self Care | Payer: Medicare HMO | Attending: Emergency Medicine | Admitting: Emergency Medicine

## 2023-05-11 ENCOUNTER — Ambulatory Visit (HOSPITAL_COMMUNITY)
Admission: EM | Admit: 2023-05-11 | Discharge: 2023-05-11 | Disposition: A | Discharge status: Home / Self Care | Payer: Medicare HMO | Attending: Physician Assistant | Admitting: Physician Assistant

## 2023-05-11 ENCOUNTER — Ambulatory Visit (INDEPENDENT_AMBULATORY_CARE_PROVIDER_SITE_OTHER): Payer: Medicare HMO

## 2023-05-11 ENCOUNTER — Encounter (HOSPITAL_COMMUNITY): Payer: Self-pay

## 2023-05-11 ENCOUNTER — Other Ambulatory Visit: Payer: Self-pay

## 2023-05-11 DIAGNOSIS — M5442 Lumbago with sciatica, left side: Secondary | ICD-10-CM

## 2023-05-11 DIAGNOSIS — Y92410 Unspecified street and highway as the place of occurrence of the external cause: Secondary | ICD-10-CM | POA: Diagnosis not present

## 2023-05-11 DIAGNOSIS — M5116 Intervertebral disc disorders with radiculopathy, lumbar region: Secondary | ICD-10-CM | POA: Insufficient documentation

## 2023-05-11 DIAGNOSIS — R29898 Other symptoms and signs involving the musculoskeletal system: Secondary | ICD-10-CM | POA: Diagnosis not present

## 2023-05-11 DIAGNOSIS — Z7984 Long term (current) use of oral hypoglycemic drugs: Secondary | ICD-10-CM | POA: Insufficient documentation

## 2023-05-11 DIAGNOSIS — Z79899 Other long term (current) drug therapy: Secondary | ICD-10-CM | POA: Insufficient documentation

## 2023-05-11 DIAGNOSIS — M545 Low back pain, unspecified: Secondary | ICD-10-CM | POA: Diagnosis present

## 2023-05-11 MED ORDER — KETOROLAC TROMETHAMINE 30 MG/ML IJ SOLN
INTRAMUSCULAR | Status: AC
Start: 2023-05-11 — End: ?
  Filled 2023-05-11: qty 1

## 2023-05-11 MED ORDER — KETOROLAC TROMETHAMINE 30 MG/ML IJ SOLN
30.0000 mg | Freq: Once | INTRAMUSCULAR | Status: AC
  Administered 2023-05-11: 30 mg via INTRAMUSCULAR
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
## 2023-05-11 NOTE — Discharge Instructions (Addendum)
Please go to the emergency room for further evaluation and management as we discussed. 

## 2023-05-12 ENCOUNTER — Emergency Department (HOSPITAL_COMMUNITY): Payer: Medicare HMO

## 2023-05-12 MED ORDER — PREDNISONE 20 MG PO TABS: ORAL_TABLET | ORAL | 0 refills | Status: DC

## 2023-05-12 MED ORDER — KETOROLAC TROMETHAMINE 60 MG/2ML IM SOLN
30.0000 mg | Freq: Once | INTRAMUSCULAR | Status: AC
  Administered 2023-05-12: 30 mg via INTRAMUSCULAR
  Filled 2023-05-12: qty 2

## 2023-05-12 MED ORDER — KETOROLAC TROMETHAMINE 30 MG/ML IJ SOLN: 30.0000 mg | Freq: Once | INTRAMUSCULAR | 0 refills | Status: DC

## 2023-05-12 MED ORDER — METHOCARBAMOL 500 MG PO TABS: 1000.0000 mg | ORAL_TABLET | Freq: Three times a day (TID) | ORAL | 0 refills | Status: DC | PRN
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
## 2023-06-08 ENCOUNTER — Ambulatory Visit: Payer: Medicare HMO | Admitting: Podiatry

## 2023-06-08 ENCOUNTER — Ambulatory Visit (INDEPENDENT_AMBULATORY_CARE_PROVIDER_SITE_OTHER): Payer: Medicare HMO

## 2023-06-08 ENCOUNTER — Encounter: Payer: Self-pay | Admitting: Podiatry

## 2023-06-08 DIAGNOSIS — M778 Other enthesopathies, not elsewhere classified: Secondary | ICD-10-CM | POA: Diagnosis not present

## 2023-06-08 DIAGNOSIS — M722 Plantar fascial fibromatosis: Secondary | ICD-10-CM | POA: Diagnosis not present

## 2023-06-08 DIAGNOSIS — E1149 Type 2 diabetes mellitus with other diabetic neurological complication: Secondary | ICD-10-CM | POA: Diagnosis not present

## 2023-06-08 DIAGNOSIS — M79674 Pain in right toe(s): Secondary | ICD-10-CM | POA: Diagnosis not present

## 2023-06-08 DIAGNOSIS — Z79899 Other long term (current) drug therapy: Secondary | ICD-10-CM

## 2023-06-08 DIAGNOSIS — M79675 Pain in left toe(s): Secondary | ICD-10-CM

## 2023-06-08 DIAGNOSIS — B351 Tinea unguium: Secondary | ICD-10-CM
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
## 2023-06-08 NOTE — Progress Notes (Signed)
 Subjective:   Patient ID: Wyatt Sandoval, male   DOB: 63 y.o.   MRN: [id number]   HPI Chief Complaint  Patient presents with   Foot Pain    RM#11 Knots on bottom of feet patient is a diabetic and needs dfc.   63 year old male presents the office above concerns.  He has been under the care of another podiatrist for concerns of a knot on the ball of the foot they are watching.  He states that sometimes they have not worsened and not causing significant pain.  Also needs her nails trimmed is a thick elongated he cannot trim himself.  No open lesions that he reports.  He does not continue is on gabapentin  for neuropathy.    Last BS 128 today Last A1c he reports was 6-something   Review of Systems  All other systems reviewed and are negative.  Past Medical History:  Diagnosis Date   Bilateral carotid artery disease 10/01/2015   L-CEA 11/07/2015 > 07/27/2016, 1-39% R-ICA stenosis and no left ICA stenosis > 08/20/2017 L-ICA 40-59% stenosis and R-ICA unchanged 1-39%     Carpal tunnel syndrome, bilateral 02/17/2011   Chronic back pain 09/15/2010   Seen by pain Clinic in [facility]     Chronic obstructive pulmonary disease 05/09/2020   Contact dermatitis 02/14/2015   Diabetic neuropathy 08/09/2020   Dizziness 08/24/2017   Dyshidrotic eczema 07/08/2012   Essential hypertension 02/12/2012   Gallbladder polyp 03/13/2013   4 mm polyp on gallbladder ultrasound September 2014, repeat ultrasound September 2015     Gastroesophageal reflux disease 11/07/2018   Hepatitis C    Insomnia    Joint pain 11/07/2018   Left-sided carotid artery disease    Mild cognitive impairment of uncertain or unknown etiology 01/08/2023   Mixed hyperlipidemia 07/01/2006   Open-angle glaucoma 05/11/2013   Bilateral eyes. Mild (Sandoval). Lumigan  0.01% 1 drop HS bilateral eyes. Check up every 6 months. Managed by Dr. Arley Sandoval.  Nuclear sclerosis Cataract OU (stable)  Dependent DM OU-- Stable     Skin lesion of  left arm 11/23/2016   Type 2 diabetes mellitus 07/01/2006   Wears glasses     Past Surgical History:  Procedure Laterality Date   ANTERIOR CERVICAL DECOMP/DISCECTOMY FUSION  2001; 2002   BACK SURGERY     CARDIAC CATHETERIZATION N/A 08/26/2015   Procedure: Left Heart Cath and Coronary Angiography;  Surgeon: Wyatt GORMAN Reek, MD;  Location: [facility] INVASIVE CV LAB;  Service: Cardiovascular;  Laterality: N/A;   CAROTID ENDARTERECTOMY Left 11/07/2015   CARPAL TUNNEL RELEASE Right ~ 2015   COLONOSCOPY     ENDARTERECTOMY Left 11/07/2015   Procedure: LEFT CAROTID ENDARTERECTOMY ;  Surgeon: Wyatt LELON New, MD;  Location: [facility] OR;  Service: Vascular;  Laterality: Left;     Current Outpatient Medications:    albuterol (VENTOLIN HFA) 108 (90 Base) MCG/ACT inhaler, 2 puffs every 4 (four) hours as needed for wheezing or shortness of breath., Disp: , Rfl:    amitriptyline  (ELAVIL ) 150 MG tablet, TAKE 1 TABLET(150 MG) BY MOUTH AT BEDTIME, Disp: 90 tablet, Rfl: 0   cloNIDine  (CATAPRES ) 0.1 MG tablet, TAKE 1 TABLET BY MOUTH AS NEEDED FOR BLOOD PRESSURE GREATER THAN 165/95, Disp: 30 tablet, Rfl: 11   donepezil  (ARICEPT ) 5 MG tablet, Take 1 tablet (5 mg total) by mouth every morning., Disp: 30 tablet, Rfl: 11   enalapril  (VASOTEC ) 5 MG tablet, TAKE 1 TABLET(5 MG) BY MOUTH DAILY, Disp: 90 tablet, Rfl: 0   gabapentin  (  NEURONTIN ) 300 MG capsule, Take 1 capsule (300 mg total) by mouth daily., Disp: 30 capsule, Rfl: 3   hydrOXYzine (ATARAX) 10 MG tablet, hydroxyzine HCl 10 mg tablet  TAKE 1 TABLET BY MOUTH EVERY DAY, Disp: , Rfl:    metFORMIN  (GLUCOPHAGE ) 1000 MG tablet, Take one tablet by mouth twice daily, Disp: 180 tablet, Rfl: 0   methocarbamol  (ROBAXIN ) 500 MG tablet, Take 2 tablets (1,000 mg total) by mouth every 8 (eight) hours as needed for muscle spasms., Disp: 30 tablet, Rfl: 0   metoprolol  tartrate (LOPRESSOR ) 25 MG tablet, Take 1 tablet (25 mg total) by mouth 2 (two) times daily., Disp: 180 tablet, Rfl: 3    nitroGLYCERIN  (NITROSTAT ) 0.4 MG SL tablet, DISSOLVE 1 TABLET UNDER THE TONGUE EVERY 5 MINUTES AS NEEDED FOR CHEST PAIN. TAKE UP TO 3 DOSES AS DIRECTED, Disp: 25 tablet, Rfl: 3   omeprazole  (PRILOSEC) 40 MG capsule, Take 1 capsule (40 mg total) by mouth daily., Disp: 90 capsule, Rfl: 3   oxyCODONE  (ROXICODONE ) 15 MG immediate release tablet, Take 1 tablet (15 mg total) by mouth every 4 (four) hours as needed for pain., Disp: 30 tablet, Rfl: 0   predniSONE  (DELTASONE ) 20 MG tablet, 3 Tabs PO Days 1-3, then 2 tabs PO Days 4-6, then 1 tab PO Day 7-9, then Half Tab PO Day 10-12, Disp: 20 tablet, Rfl: 0   rosuvastatin  (CRESTOR ) 20 MG tablet, TAKE 1 TABLET(20 MG) BY MOUTH DAILY, Disp: 90 tablet, Rfl: 0   terbinafine  (LAMISIL ) 250 MG tablet, Take 1 tablet (250 mg total) by mouth daily., Disp: 90 tablet, Rfl: 0   traZODone  (DESYREL ) 100 MG tablet, Take 100 mg by mouth at bedtime., Disp: , Rfl:    triamcinolone  cream (KENALOG ) 0.5 %, Apply 1 application topically 2 (two) times daily., Disp: 30 g, Rfl: 0   varenicline  (CHANTIX ) 1 MG tablet, TAKE 1 TABLET(1 MG) BY MOUTH TWICE DAILY WITH A MEAL, Disp: 180 tablet, Rfl: 0  Allergies  Allergen Reactions   Bupropion  Other (See Comments)    Memory and thinking impairment - dysphoria Memory and thinking impairment - dysphoria          Objective:  Physical Exam  General: AAO x3, NAD  Dermatological: Nails are hypertrophic, dystrophic, brittle, discolored, elongated 10. No surrounding redness or drainage. Tenderness nails 1-5 bilaterally. No open lesions or pre-ulcerative lesions are identified today.  Vascular: Dorsalis Pedis artery and Posterior Tibial artery pedal pulses are 2/4 bilateral with immedate capillary fill time. There is no pain with calf compression, swelling, warmth, erythema.   Neruologic: Grossly intact via light touch bilateral.    Musculoskeletal: On the open of the plantar fascia and the arch of the foot are firm nodules consistent  with plantar fibromas.  There is no pain in the area.  No edema, erythema.  Gait: Unassisted, Nonantalgic.       Assessment:   63 year old male with symptomatic onychosis, plantar fibromatosis     Plan:  -Treatment options discussed including all alternatives, risks, and complications -Etiology of symptoms were discussed -X-rays obtained reviewed.  Multiple views of bilateral feet were obtained.  No evidence of acute fracture.  No calcifications present.  Symptomatic onychomycosis -Sharply debrided nails x 10 without any complications or bleeding. -We discussed treatment options for nail fungus including oral, topical as well as alternative treatments.  He would like to proceed with oral Lamisil .  Discussed side effects and success rates.  Will check a CBC, LFT prior to starting medication.  Plantar fibromatosis -Discussed stretching exercises as well supportive shoe gear for offloading.  Continue to monitor.  Donnice JONELLE Fees DPM

## 2023-06-08 NOTE — Patient Instructions (Signed)
 Diabetes Mellitus and Foot Care Diabetes, also called diabetes mellitus, may cause problems with your feet and legs because of poor blood flow (circulation). Poor circulation may make your skin: Become thinner and drier. Break more easily. Heal more slowly. Peel and crack. You may also have nerve damage (neuropathy). This can cause decreased feeling in your legs and feet. This means that you may not notice minor injuries to your feet that could lead to more serious problems. Finding and treating problems early is the best way to prevent future foot problems. How to care for your feet Foot hygiene  Wash your feet daily with warm water and mild soap. Do not use hot water. Then, pat your feet and the areas between your toes until they are fully dry. Do not soak your feet. This can dry your skin. Trim your toenails straight across. Do not dig under them or around the cuticle. File the edges of your nails with an emery board or nail file. Apply a moisturizing lotion or petroleum jelly to the skin on your feet and to dry, brittle toenails. Use lotion that does not contain alcohol and is unscented. Do not apply lotion between your toes. Shoes and socks Wear clean socks or stockings every day. Make sure they are not too tight. Do not wear knee-high stockings. These may decrease blood flow to your legs. Wear shoes that fit well and have enough cushioning. Always look in your shoes before you put them on to be sure there are no objects inside. To break in new shoes, wear them for just a few hours a day. This prevents injuries on your feet. Wounds, scrapes, corns, and calluses  Check your feet daily for blisters, cuts, bruises, sores, and redness. If you cannot see the bottom of your feet, use a mirror or ask someone for help. Do not cut off corns or calluses or try to remove them with medicine. If you find a minor scrape, cut, or break in the skin on your feet, keep it and the skin around it clean and  dry. You may clean these areas with mild soap and water. Do not clean the area with peroxide, alcohol, or iodine. If you have a wound, scrape, corn, or callus on your foot, look at it several times a day to make sure it is healing and not infected. Check for: Redness, swelling, or pain. Fluid or blood. Warmth. Pus or a bad smell. General tips Do not cross your legs. This may decrease blood flow to your feet. Do not use heating pads or hot water bottles on your feet. They may burn your skin. If you have lost feeling in your feet or legs, you may not know this is happening until it is too late. Protect your feet from hot and cold by wearing shoes, such as at the beach or on hot pavement. Schedule a complete foot exam at least once a year or more often if you have foot problems. Report any cuts, sores, or bruises to your health care provider right away. Where to find more information American Diabetes Association: diabetes.org Association of Diabetes Care & Education Specialists: diabeteseducator.org Contact a health care provider if: You have a condition that increases your risk of infection, and you have any cuts, sores, or bruises on your feet. You have an injury that is not healing. You have redness on your legs or feet. You feel burning or tingling in your legs or feet. You have pain or cramps in your legs  and feet. Your legs or feet are numb. Your feet always feel cold. You have pain around any toenails. Get help right away if: You have a wound, scrape, corn, or callus on your foot and: You have signs of infection. You have a fever. You have a red line going up your leg. This information is not intended to replace advice given to you by your health care provider. Make sure you discuss any questions you have with your health care provider. Document Revised: 10/22/2021 Document Reviewed: 10/22/2021 Elsevier Patient Education  2024 Elsevier Inc.  Terbinafine Tablets What is this  medication? TERBINAFINE (TER bin a feen) treats fungal infections of the nails. It belongs to a group of medications called antifungals. It will not treat infections caused by bacteria or viruses. This medicine may be used for other purposes; ask your health care provider or pharmacist if you have questions. COMMON BRAND NAME(S): Lamisil, Terbinex What should I tell my care team before I take this medication? They need to know if you have any of these conditions: Liver disease An unusual or allergic reaction to terbinafine, other medications, foods, dyes, or preservatives Pregnant or trying to get pregnant Breast-feeding How should I use this medication? Take this medication by mouth with water. Take it as directed on the prescription label at the same time every day. You can take it with or without food. If it upsets your stomach, take it with food. Keep taking it unless your care team tells you to stop. A special MedGuide will be given to you by the pharmacist with each prescription and refill. Be sure to read this information carefully each time. Talk to your care team about the use of this medication in children. Special care may be needed. Overdosage: If you think you have taken too much of this medicine contact a poison control center or emergency room at once. NOTE: This medicine is only for you. Do not share this medicine with others. What if I miss a dose? If you miss a dose, take it as soon as you can unless it is more than 4 hours late. If it is more than 4 hours late, skip the missed dose. Take the next dose at the normal time. What may interact with this medication? Do not take this medication with any of the following: Pimozide Thioridazine This medication may also interact with the following: Beta blockers Caffeine  Certain medications for mental health conditions Cimetidine Cyclosporine Medications for fungal infections, such as fluconazole or ketoconazole Medications for  irregular heartbeat, such as amiodarone, flecainide, propafenone Rifampin Warfarin This list may not describe all possible interactions. Give your health care provider a list of all the medicines, herbs, non-prescription drugs, or dietary supplements you use. Also tell them if you smoke, drink alcohol, or use illegal drugs. Some items may interact with your medicine. What should I watch for while using this medication? Visit your care team for regular checks on your progress. Tell your care team if your symptoms do not start to get better or if they get worse. This medication may cause serious skin reactions. They can happen weeks to months after starting the medication. Contact your care team right away if you notice fevers or flu-like symptoms with a rash. The rash may be red or purple and then turn into blisters or peeling of the skin. You may also notice a red rash with swelling of the face, lips, or lymph nodes in your neck or under your arms. This medication can  make you more sensitive to the sun. Keep out of the sun. If you cannot avoid being in the sun, wear protective clothing and sunscreen. Do not use sun lamps, tanning beds, or tanning booths. What side effects may I notice from receiving this medication? Side effects that you should report to your care team as soon as possible: Allergic reactions--skin rash, itching, hives, swelling of the face, lips, tongue, or throat Change in sense of smell Change in taste Infection--fever, chills, cough, or sore throat Liver injury--right upper belly pain, loss of appetite, nausea, light-colored stool, dark yellow or brown urine, yellowing skin or eyes, unusual weakness or fatigue Low red blood cell level--unusual weakness or fatigue, dizziness, headache, trouble breathing Lupus-like syndrome--joint pain, swelling, or stiffness, butterfly-shaped rash on the face, rashes that get worse in the sun, fever, unusual weakness or fatigue Rash, fever, and  swollen lymph nodes Redness, blistering, peeling, or loosening of the skin, including inside the mouth Unusual bruising or bleeding Worsening mood, feelings of depression Side effects that usually do not require medical attention (report to your care team if they continue or are bothersome): Diarrhea Gas Headache Nausea Stomach pain Upset stomach This list may not describe all possible side effects. Call your doctor for medical advice about side effects. You may report side effects to FDA at 1-800-FDA-1088. Where should I keep my medication? Keep out of the reach of children and pets. Store between 20 and 25 degrees C (68 and 77 degrees F). Protect from light. Get rid of any unused medication after the expiration date. To get rid of medications that are no longer needed or have expired: Take the medication to a medication take-back program. Check with your pharmacy or law enforcement to find a location. If you cannot return the medication, check the label or package insert to see if the medication should be thrown out in the garbage or flushed down the toilet. If you are not sure, ask your care team. If it is safe to put it in the trash, take the medication out of the container. Mix the medication with cat litter, dirt, coffee grounds, or other unwanted substance. Seal the mixture in a bag or container. Put it in the trash. NOTE: This sheet is a summary. It may not cover all possible information. If you have questions about this medicine, talk to your doctor, pharmacist, or health care provider.  2024 Elsevier/Gold Standard (2022-11-06 00:00:00)

## 2023-06-09 ENCOUNTER — Other Ambulatory Visit: Payer: Self-pay

## 2023-06-09 ENCOUNTER — Ambulatory Visit: Payer: Medicare HMO | Attending: Neurosurgery

## 2023-06-09 DIAGNOSIS — M6281 Muscle weakness (generalized): Secondary | ICD-10-CM | POA: Diagnosis present

## 2023-06-09 DIAGNOSIS — M5459 Other low back pain: Secondary | ICD-10-CM | POA: Diagnosis present

## 2023-06-09 DIAGNOSIS — M5416 Radiculopathy, lumbar region: Secondary | ICD-10-CM | POA: Insufficient documentation

## 2023-06-09 LAB — CBC WITH DIFFERENTIAL/PLATELET
Basophils Absolute: 0 10*3/uL (ref 0.0–0.2)
Basos: 0 %
EOS (ABSOLUTE): 0.2 10*3/uL (ref 0.0–0.4)
Eos: 2 %
Hematocrit: 44.4 % (ref 37.5–51.0)
Hemoglobin: 13.9 g/dL (ref 13.0–17.7)
Immature Grans (Abs): 0 10*3/uL (ref 0.0–0.1)
Immature Granulocytes: 0 %
Lymphocytes Absolute: 3.6 10*3/uL — ABNORMAL HIGH (ref 0.7–3.1)
Lymphs: 46 %
MCH: 25.7 pg — ABNORMAL LOW (ref 26.6–33.0)
MCHC: 31.3 g/dL — ABNORMAL LOW (ref 31.5–35.7)
MCV: 82 fL (ref 79–97)
Monocytes Absolute: 0.7 10*3/uL (ref 0.1–0.9)
Monocytes: 8 %
Neutrophils Absolute: 3.6 10*3/uL (ref 1.4–7.0)
Neutrophils: 44 %
Platelets: 306 10*3/uL (ref 150–450)
RBC: 5.41 x10E6/uL (ref 4.14–5.80)
RDW: 15.1 % (ref 11.6–15.4)
WBC: 8.1 10*3/uL (ref 3.4–10.8)

## 2023-06-09 LAB — HEPATIC FUNCTION PANEL
ALT: 42 [IU]/L (ref 0–44)
AST: 21 [IU]/L (ref 0–40)
Albumin: 4.5 g/dL (ref 3.8–4.9)
Alkaline Phosphatase: 70 [IU]/L (ref 44–121)
Bilirubin Total: <0.2 mg/dL (ref 0.0–1.2)
Bilirubin, Direct: <0.08 mg/dL (ref 0.00–0.40)
Total Protein: 6.6 g/dL (ref 6.0–8.5)

## 2023-06-09 NOTE — Patient Instructions (Signed)
 Access Code: [email or web address] URL: [email or web address] Date: 06/09/2023 Prepared by: Alm Kingdom  Exercises - Seated Hamstring Stretch  - 2 x daily - 7 x weekly - 2 reps - 30 sec hold - Supine Single Knee to Chest Stretch  - 2 x daily - 7 x weekly - 2 reps - 30 sec hold - Supine Lower Trunk Rotation  - 2 x daily - 7 x weekly - 2 sets - 10 reps - Supine Posterior Pelvic Tilt  - 1 x daily - 7 x weekly - 2 sets - 10 reps - 5 sec hold

## 2023-06-11 ENCOUNTER — Encounter: Payer: Self-pay | Admitting: Podiatry

## 2023-06-11 ENCOUNTER — Other Ambulatory Visit: Payer: Self-pay | Admitting: Podiatry

## 2023-06-11 DIAGNOSIS — Z79899 Other long term (current) drug therapy: Secondary | ICD-10-CM

## 2023-06-11 MED ORDER — TERBINAFINE HCL 250 MG PO TABS: 250.0000 mg | ORAL_TABLET | Freq: Every day | ORAL | 0 refills | Status: DC

## 2023-06-21 ENCOUNTER — Ambulatory Visit: Payer: Medicare HMO | Admitting: Physical Therapy

## 2023-06-21 ENCOUNTER — Other Ambulatory Visit: Payer: Self-pay

## 2023-06-21 ENCOUNTER — Encounter: Payer: Self-pay | Admitting: Physical Therapy

## 2023-06-21 DIAGNOSIS — M5459 Other low back pain: Secondary | ICD-10-CM

## 2023-06-21 DIAGNOSIS — M5416 Radiculopathy, lumbar region: Secondary | ICD-10-CM

## 2023-06-21 DIAGNOSIS — M6281 Muscle weakness (generalized): Secondary | ICD-10-CM
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
## 2023-06-22 NOTE — Patient Instructions (Signed)
Access Code: [email or web address] URL: [email or web address] Date: 06/22/2023 Prepared by: Rosana Hoes  Exercises - Supine Single Knee to Chest Stretch  - 2 x daily - 7 x weekly - 2 reps - 30 sec hold - Supine Lower Trunk Rotation  - 2 x daily - 7 x weekly - 2 sets - 10 reps - Supine Posterior Pelvic Tilt  - 1 x daily - 7 x weekly - 2 sets - 10 reps - 5 sec hold - Prone Press Up  - 1 x daily - 3 sets - 10 reps

## 2023-06-23 ENCOUNTER — Encounter: Payer: Self-pay | Admitting: Physical Therapy

## 2023-06-23 ENCOUNTER — Ambulatory Visit: Payer: Medicare HMO | Admitting: Physical Therapy

## 2023-06-23 ENCOUNTER — Other Ambulatory Visit: Payer: Self-pay

## 2023-06-23 DIAGNOSIS — M6281 Muscle weakness (generalized): Secondary | ICD-10-CM

## 2023-06-23 DIAGNOSIS — M5459 Other low back pain: Secondary | ICD-10-CM | POA: Diagnosis not present

## 2023-06-23 DIAGNOSIS — M5416 Radiculopathy, lumbar region: Secondary | ICD-10-CM
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
## 2023-06-28 ENCOUNTER — Other Ambulatory Visit: Payer: Self-pay

## 2023-06-28 ENCOUNTER — Encounter: Payer: Self-pay | Admitting: Physical Therapy

## 2023-06-28 ENCOUNTER — Ambulatory Visit: Payer: Medicare HMO | Admitting: Physical Therapy

## 2023-06-28 DIAGNOSIS — M5459 Other low back pain: Secondary | ICD-10-CM | POA: Diagnosis not present

## 2023-06-28 DIAGNOSIS — M5416 Radiculopathy, lumbar region: Secondary | ICD-10-CM

## 2023-06-28 DIAGNOSIS — M6281 Muscle weakness (generalized): Secondary | ICD-10-CM

## 2023-06-28 NOTE — Patient Instructions (Signed)
 Access Code: [email or web address] URL: [email or web address] Date: 06/28/2023 Prepared by: Rosana Hoes  Exercises - Supine Single Knee to Chest Stretch  - 2 x daily - 7 x weekly - 2 reps - 30 sec hold - Supine Lower Trunk Rotation  - 2 x daily - 7 x weekly - 2 sets - 10 reps - Supine Posterior Pelvic Tilt  - 1 x daily - 7 x weekly - 2 sets - 10 reps - 5 sec hold - Prone Press Up  - 1 x daily - 3 sets - 10 reps - Standing Lumbar Extension with Counter  - 1 x daily - 3 sets - 10 reps
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
## 2023-06-30 ENCOUNTER — Ambulatory Visit: Payer: Medicare HMO | Admitting: Physical Therapy

## 2023-06-30 ENCOUNTER — Encounter: Payer: Self-pay | Admitting: Physical Therapy

## 2023-06-30 DIAGNOSIS — M5459 Other low back pain: Secondary | ICD-10-CM | POA: Diagnosis not present

## 2023-06-30 DIAGNOSIS — M6281 Muscle weakness (generalized): Secondary | ICD-10-CM

## 2023-06-30 DIAGNOSIS — M5416 Radiculopathy, lumbar region: Secondary | ICD-10-CM
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
## 2023-07-05 ENCOUNTER — Ambulatory Visit: Payer: Medicare HMO | Attending: Neurosurgery | Admitting: Physical Therapy

## 2023-07-05 ENCOUNTER — Encounter: Payer: Self-pay | Admitting: Physical Therapy

## 2023-07-05 ENCOUNTER — Other Ambulatory Visit: Payer: Self-pay

## 2023-07-05 DIAGNOSIS — M6281 Muscle weakness (generalized): Secondary | ICD-10-CM | POA: Diagnosis present

## 2023-07-05 DIAGNOSIS — M5459 Other low back pain: Secondary | ICD-10-CM | POA: Diagnosis present

## 2023-07-05 NOTE — Patient Instructions (Signed)
 Access Code: [email or web address] URL: [email or web address] Date: 07/05/2023 Prepared by: Rosana Hoes  Exercises - Supine Single Knee to Chest Stretch  - 2 x daily - 7 x weekly - 2-3 reps - 30 sec hold - Supine Lower Trunk Rotation  - 2 x daily - 7 x weekly - 2 sets - 10 reps - Supine Piriformis Stretch with Foot on Ground  - 2 x daily - 7 x weekly - 2-3 reps - 30 seconds hold - Supine Posterior Pelvic Tilt  - 2 x daily - 7 x weekly - 2 sets - 10 reps - 5 sec hold - Prone Press Up  - 1 x daily - 3 sets - 10 reps - Standing Lumbar Extension with Counter  - 1 x daily - 3 sets - 10 reps
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
## 2023-07-07 ENCOUNTER — Ambulatory Visit: Payer: Medicare HMO | Admitting: Physical Therapy

## 2023-07-09 ENCOUNTER — Other Ambulatory Visit: Payer: Self-pay | Admitting: Internal Medicine

## 2023-07-12 ENCOUNTER — Encounter: Payer: Medicare HMO | Admitting: Physical Therapy

## 2023-07-14 ENCOUNTER — Ambulatory Visit: Payer: Medicare HMO | Admitting: Physical Therapy

## 2023-07-29 ENCOUNTER — Telehealth: Payer: Self-pay | Admitting: *Deleted

## 2023-07-29 NOTE — Telephone Encounter (Signed)
   Name: Banks Chaikin  DOB: December 25, 1960  MRN: [id number]  Primary Cardiologist: Dietrich Pates, MD  Chart reviewed as part of pre-operative protocol coverage. Because of Seamus Hogeland's past medical history and time since last visit, he will require a follow-up telephone visit in order to better assess preoperative cardiovascular risk.  Pre-op covering staff: - Please schedule appointment and call patient to inform them. If patient already had an upcoming appointment within acceptable timeframe, please add "pre-op clearance" to the appointment notes so provider is aware. - Please contact requesting surgeon's office via preferred method (i.e, phone, fax) to inform them of need for appointment prior to surgery.  No medications indicated as needing held.  Sharlene Dory, PA-C  07/29/2023, 8:24 AM

## 2023-07-29 NOTE — Telephone Encounter (Signed)
 Pt returned call and stated that he wasn't going to do the surgery next week, that he was going to put it off.  Will send back to the surgeon's office to make them aware and have them re-send the clearance once surgery has been rescheduled.

## 2023-07-29 NOTE — Telephone Encounter (Signed)
1st attempt to reach pt regarding surgical clearance and the need for a tele visit.  Left message for pt to call back and ask for the preop team.  

## 2023-07-29 NOTE — Telephone Encounter (Signed)
   Pre-operative Risk Assessment    Patient Name: Phuong Moffatt  DOB: May 27, 1960 MRN: [id number]   Date of last office visit: 04/26/2023 Date of next office visit: NONE   Request for Surgical Clearance    Procedure:   L5-S1 LUMBAR MICRODISCKECTOMY  Date of Surgery:  Clearance 08/04/23                                Surgeon:  Temple Pacini Surgeon's Group or Practice Name:  [facility] Phone number:  [phone] Fax number:  [phone]   Type of Clearance Requested:   - Medical    Type of Anesthesia:  General    Additional requests/questions:    Wilhemina Cash   07/29/2023, 6:57 AM

## 2023-08-09 ENCOUNTER — Other Ambulatory Visit: Payer: Self-pay | Admitting: Internal Medicine

## 2023-08-09 DIAGNOSIS — F17200 Nicotine dependence, unspecified, uncomplicated: Secondary | ICD-10-CM

## 2023-08-10 NOTE — Telephone Encounter (Signed)
 Dr. Charlott Rakes Pt. As this is not a cardiac RX does Dr. Tenny Craw want to refill? Please advise

## 2023-09-06 ENCOUNTER — Ambulatory Visit (INDEPENDENT_AMBULATORY_CARE_PROVIDER_SITE_OTHER): Payer: Medicare HMO | Admitting: Podiatry

## 2023-09-06 DIAGNOSIS — M79674 Pain in right toe(s): Secondary | ICD-10-CM | POA: Diagnosis not present

## 2023-09-06 DIAGNOSIS — M79675 Pain in left toe(s): Secondary | ICD-10-CM

## 2023-09-06 DIAGNOSIS — E1149 Type 2 diabetes mellitus with other diabetic neurological complication: Secondary | ICD-10-CM

## 2023-09-06 DIAGNOSIS — B351 Tinea unguium: Secondary | ICD-10-CM

## 2023-09-06 DIAGNOSIS — M722 Plantar fascial fibromatosis: Secondary | ICD-10-CM

## 2023-09-06 MED ORDER — KETOCONAZOLE 2 % EX CREA: 1.0000 | TOPICAL_CREAM | Freq: Every day | CUTANEOUS | 0 refills | Status: AC

## 2023-09-06 MED ORDER — TERBINAFINE HCL 250 MG PO TABS: 250.0000 mg | ORAL_TABLET | Freq: Every day | ORAL | 0 refills | Status: DC

## 2023-09-06 NOTE — Progress Notes (Signed)
 Subjective:   Patient ID: Wyatt Sandoval, male   DOB: 63 y.o.   MRN: [id number]   HPI No chief complaint on file.   63 year old male presents the office today for 2 concerns.  He presents today for thick, elongated nails that he is not able to trim himself.  He states he never picked up the Lamisil .  Does not report any drainage or open lesions.  He also still gets bumps on the bottoms of his feet right foot worse than left.  No other areas of discomfort that he reports no injuries.   Review of Systems  All other systems reviewed and are negative.      Objective:  Physical Exam  General: AAO x3, NAD  Dermatological: Nails are hypertrophic, dystrophic, brittle, discolored, elongated 10. No surrounding redness or drainage. Tenderness nails 1-5 bilaterally. No open lesions or pre-ulcerative lesions are identified today.  Mild interdigital tinea pedis present.  Vascular: Dorsalis Pedis artery and Posterior Tibial artery pedal pulses are 2/4 bilateral with immedate capillary fill time. There is no pain with calf compression, swelling, warmth, erythema.   Neruologic: Grossly intact via light touch bilateral.    Musculoskeletal: On the open of the plantar fascia and the arch of the foot are firm nodules consistent with plantar fibromas.  There is no pain in the area.  No edema, erythema.  Gait: Unassisted, Nonantalgic.       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 -We discussed treatment options for nail fungus including oral, topical as well as alternative treatments.  He would like to proceed with oral Lamisil .  I have resent this for him.  Will recheck CBC and LFT in 6 weeks. - Ketoconazole  for athlete's foot although the oral occasion should take care of this as well.  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  Order  compound cream today through [employer].  Will also check on inserts to help offload.  Charity Conch DPM

## 2023-09-06 NOTE — Patient Instructions (Addendum)
 I would like to recheck blood work in 6 weeks. You can go by the lab to get it done at that time.   --  Terbinafine  Tablets What is this medication? TERBINAFINE  (TER bin a feen) treats fungal infections of the nails. It belongs to a group of medications called antifungals. It will not treat infections caused by bacteria or viruses. This medicine may be used for other purposes; ask your health care provider or pharmacist if you have questions. COMMON BRAND NAME(S): Lamisil , Terbinex What should I tell my care team before I take this medication? They need to know if you have any of these conditions: Liver disease An unusual or allergic reaction to terbinafine , other medications, foods, dyes, or preservatives Pregnant or trying to get pregnant Breast-feeding How should I use this medication? Take this medication by mouth with water. Take it as directed on the prescription label at the same time every day. You can take it with or without food. If it upsets your stomach, take it with food. Keep taking it unless your care team tells you to stop. A special MedGuide will be given to you by the pharmacist with each prescription and refill. Be sure to read this information carefully each time. Talk to your care team about the use of this medication in children. Special care may be needed. Overdosage: If you think you have taken too much of this medicine contact a poison control center or emergency room at once. NOTE: This medicine is only for you. Do not share this medicine with others. What if I miss a dose? If you miss a dose, take it as soon as you can unless it is more than 4 hours late. If it is more than 4 hours late, skip the missed dose. Take the next dose at the normal time. What may interact with this medication? Do not take this medication with any of the following: Pimozide Thioridazine This medication may also interact with the following: Beta blockers Caffeine Certain medications  for mental health conditions Cimetidine Cyclosporine Medications for fungal infections, such as fluconazole or ketoconazole  Medications for irregular heartbeat, such as amiodarone, flecainide, propafenone Rifampin Warfarin This list may not describe all possible interactions. Give your health care provider a list of all the medicines, herbs, non-prescription drugs, or dietary supplements you use. Also tell them if you smoke, drink alcohol, or use illegal drugs. Some items may interact with your medicine. What should I watch for while using this medication? Visit your care team for regular checks on your progress. Tell your care team if your symptoms do not start to get better or if they get worse. This medication may cause serious skin reactions. They can happen weeks to months after starting the medication. Contact your care team right away if you notice fevers or flu-like symptoms with a rash. The rash may be red or purple and then turn into blisters or peeling of the skin. You may also notice a red rash with swelling of the face, lips, or lymph nodes in your neck or under your arms. This medication can make you more sensitive to the sun. Keep out of the sun. If you cannot avoid being in the sun, wear protective clothing and sunscreen. Do not use sun lamps, tanning beds, or tanning booths. What side effects may I notice from receiving this medication? Side effects that you should report to your care team as soon as possible: Allergic reactions--skin rash, itching, hives, swelling of the face, lips, tongue, or  throat Change in sense of smell Change in taste Infection--fever, chills, cough, or sore throat Liver injury--right upper belly pain, loss of appetite, nausea, light-colored stool, dark yellow or brown urine, yellowing skin or eyes, unusual weakness or fatigue Low red blood cell level--unusual weakness or fatigue, dizziness, headache, trouble breathing Lupus-like syndrome--joint pain,  swelling, or stiffness, butterfly-shaped rash on the face, rashes that get worse in the sun, fever, unusual weakness or fatigue Rash, fever, and swollen lymph nodes Redness, blistering, peeling, or loosening of the skin, including inside the mouth Unusual bruising or bleeding Worsening mood, feelings of depression Side effects that usually do not require medical attention (report to your care team if they continue or are bothersome): Diarrhea Gas Headache Nausea Stomach pain Upset stomach This list may not describe all possible side effects. Call your doctor for medical advice about side effects. You may report side effects to FDA at 1-800-FDA-1088. Where should I keep my medication? Keep out of the reach of children and pets. Store between 20 and 25 degrees C (68 and 77 degrees F). Protect from light. Get rid of any unused medication after the expiration date. To get rid of medications that are no longer needed or have expired: Take the medication to a medication take-back program. Check with your pharmacy or law enforcement to find a location. If you cannot return the medication, check the label or package insert to see if the medication should be thrown out in the garbage or flushed down the toilet. If you are not sure, ask your care team. If it is safe to put it in the trash, take the medication out of the container. Mix the medication with cat litter, dirt, coffee grounds, or other unwanted substance. Seal the mixture in a bag or container. Put it in the trash. NOTE: This sheet is a summary. It may not cover all possible information. If you have questions about this medicine, talk to your doctor, pharmacist, or health care provider.  2024 Elsevier/Gold Standard (2022-11-06 00:00:00)  --  I have ordered a medication for you that will come from [facility] in [place]. They should be calling you to verify insurance and will mail the medication to you. If you live close by then  you can go by their pharmacy to pick up the medication. Their phone number is [phone]. If you do not hear from them in the next few days, please give us  a call at [phone].  --  Plantar Fasciitis (Heel Spur Syndrome) with Rehab The plantar fascia is a fibrous, ligament-like, soft-tissue structure that spans the bottom of the foot. Plantar fasciitis is a condition that causes pain in the foot due to inflammation of the tissue. SYMPTOMS  Pain and tenderness on the underneath side of the foot. Pain that worsens with standing or walking. CAUSES  Plantar fasciitis is caused by irritation and injury to the plantar fascia on the underneath side of the foot. Common mechanisms of injury include: Direct trauma to bottom of the foot. Damage to a small nerve that runs under the foot where the main fascia attaches to the heel bone. Stress placed on the plantar fascia due to bone spurs. RISK INCREASES WITH:  Activities that place stress on the plantar fascia (running, jumping, pivoting, or cutting). Poor strength and flexibility. Improperly fitted shoes. Tight calf muscles. Flat feet. Failure to warm-up properly before activity. Obesity. PREVENTION Warm up and stretch properly before activity. Allow for adequate recovery between workouts. Maintain physical fitness: Strength, flexibility, and  endurance. Cardiovascular fitness. Maintain a health body weight. Avoid stress on the plantar fascia. Wear properly fitted shoes, including arch supports for individuals who have flat feet.  PROGNOSIS  If treated properly, then the symptoms of plantar fasciitis usually resolve without surgery. However, occasionally surgery is necessary.  RELATED COMPLICATIONS  Recurrent symptoms that may result in a chronic condition. Problems of the lower back that are caused by compensating for the injury, such as limping. Pain or weakness of the foot during push-off following surgery. Chronic inflammation,  scarring, and partial or complete fascia tear, occurring more often from repeated injections.  TREATMENT  Treatment initially involves the use of ice and medication to help reduce pain and inflammation. The use of strengthening and stretching exercises may help reduce pain with activity, especially stretches of the Achilles tendon. These exercises may be performed at home or with a therapist. Your caregiver may recommend that you use heel cups of arch supports to help reduce stress on the plantar fascia. Occasionally, corticosteroid injections are given to reduce inflammation. If symptoms persist for greater than 6 months despite non-surgical (conservative), then surgery may be recommended.   MEDICATION  If pain medication is necessary, then nonsteroidal anti-inflammatory medications, such as aspirin  and ibuprofen, or other minor pain relievers, such as acetaminophen , are often recommended. Do not take pain medication within 7 days before surgery. Prescription pain relievers may be given if deemed necessary by your caregiver. Use only as directed and only as much as you need. Corticosteroid injections may be given by your caregiver. These injections should be reserved for the most serious cases, because they may only be given a certain number of times.  HEAT AND COLD Cold treatment (icing) relieves pain and reduces inflammation. Cold treatment should be applied for 10 to 15 minutes every 2 to 3 hours for inflammation and pain and immediately after any activity that aggravates your symptoms. Use ice packs or massage the area with a piece of ice (ice massage). Heat treatment may be used prior to performing the stretching and strengthening activities prescribed by your caregiver, physical therapist, or athletic trainer. Use a heat pack or soak the injury in warm water.  SEEK IMMEDIATE MEDICAL CARE IF: Treatment seems to offer no benefit, or the condition worsens. Any medications produce adverse side  effects.  EXERCISES- RANGE OF MOTION (ROM) AND STRETCHING EXERCISES - Plantar Fasciitis (Heel Spur Syndrome) These exercises may help you when beginning to rehabilitate your injury. Your symptoms may resolve with or without further involvement from your physician, physical therapist or athletic trainer. While completing these exercises, remember:  Restoring tissue flexibility helps normal motion to return to the joints. This allows healthier, less painful movement and activity. An effective stretch should be held for at least 30 seconds. A stretch should never be painful. You should only feel a gentle lengthening or release in the stretched tissue.  RANGE OF MOTION - Toe Extension, Flexion Sit with your right / left leg crossed over your opposite knee. Grasp your toes and gently pull them back toward the top of your foot. You should feel a stretch on the bottom of your toes and/or foot. Hold this stretch for 10 seconds. Now, gently pull your toes toward the bottom of your foot. You should feel a stretch on the top of your toes and or foot. Hold this stretch for 10 seconds. Repeat  times. Complete this stretch 3 times per day.   RANGE OF MOTION - Ankle Dorsiflexion, Active Assisted Remove shoes  and sit on a chair that is preferably not on a carpeted surface. Place right / left foot under knee. Extend your opposite leg for support. Keeping your heel down, slide your right / left foot back toward the chair until you feel a stretch at your ankle or calf. If you do not feel a stretch, slide your bottom forward to the edge of the chair, while still keeping your heel down. Hold this stretch for 10 seconds. Repeat 3 times. Complete this stretch 2 times per day.   STRETCH  Gastroc, Standing Place hands on wall. Extend right / left leg, keeping the front knee somewhat bent. Slightly point your toes inward on your back foot. Keeping your right / left heel on the floor and your knee straight, shift  your weight toward the wall, not allowing your back to arch. You should feel a gentle stretch in the right / left calf. Hold this position for 10 seconds. Repeat 3 times. Complete this stretch 2 times per day.  STRETCH  Soleus, Standing Place hands on wall. Extend right / left leg, keeping the other knee somewhat bent. Slightly point your toes inward on your back foot. Keep your right / left heel on the floor, bend your back knee, and slightly shift your weight over the back leg so that you feel a gentle stretch deep in your back calf. Hold this position for 10 seconds. Repeat 3 times. Complete this stretch 2 times per day.  STRETCH  Gastrocsoleus, Standing  Note: This exercise can place a lot of stress on your foot and ankle. Please complete this exercise only if specifically instructed by your caregiver.  Place the ball of your right / left foot on a step, keeping your other foot firmly on the same step. Hold on to the wall or a rail for balance. Slowly lift your other foot, allowing your body weight to press your heel down over the edge of the step. You should feel a stretch in your right / left calf. Hold this position for 10 seconds. Repeat this exercise with a slight bend in your right / left knee. Repeat 3 times. Complete this stretch 2 times per day.   STRENGTHENING EXERCISES - Plantar Fasciitis (Heel Spur Syndrome)  These exercises may help you when beginning to rehabilitate your injury. They may resolve your symptoms with or without further involvement from your physician, physical therapist or athletic trainer. While completing these exercises, remember:  Muscles can gain both the endurance and the strength needed for everyday activities through controlled exercises. Complete these exercises as instructed by your physician, physical therapist or athletic trainer. Progress the resistance and repetitions only as guided.  STRENGTH - Towel Curls Sit in a chair positioned on a  non-carpeted surface. Place your foot on a towel, keeping your heel on the floor. Pull the towel toward your heel by only curling your toes. Keep your heel on the floor. Repeat 3 times. Complete this exercise 2 times per day.  STRENGTH - Ankle Inversion Secure one end of a rubber exercise band/tubing to a fixed object (table, pole). Loop the other end around your foot just before your toes. Place your fists between your knees. This will focus your strengthening at your ankle. Slowly, pull your big toe up and in, making sure the band/tubing is positioned to resist the entire motion. Hold this position for 10 seconds. Have your muscles resist the band/tubing as it slowly pulls your foot back to the starting position. Repeat 3  times. Complete this exercises 2 times per day.  Document Released: 04/20/2005 Document Revised: 07/13/2011 Document Reviewed: 08/02/2008 [facility] Patient Information 2014 [place], [place].

## 2023-09-10 ENCOUNTER — Telehealth: Payer: Self-pay | Admitting: Internal Medicine

## 2023-09-10 NOTE — Telephone Encounter (Signed)
 Riley from [email or web address] is calling wanting to know if the life insurance forms faxed on 05/07 were received.   Please advise.

## 2023-09-10 NOTE — Telephone Encounter (Signed)
 Left voicemail to return call to office

## 2023-09-13 NOTE — Telephone Encounter (Signed)
 Forms faxed back to [phone].

## 2023-09-13 NOTE — Telephone Encounter (Signed)
 Forms received and given to the clerical staff to be processed as per protocol.

## 2023-09-22 ENCOUNTER — Other Ambulatory Visit: Payer: Self-pay | Admitting: Physician Assistant

## 2023-09-22 DIAGNOSIS — I739 Peripheral vascular disease, unspecified: Secondary | ICD-10-CM

## 2023-09-29 ENCOUNTER — Telehealth: Payer: Self-pay | Admitting: Internal Medicine

## 2023-09-29 NOTE — Telephone Encounter (Signed)
 Received a [employer] form.  Patient is applying for life insurance.  I spoke with patient who said that he will come in this week to pay the form fee of $29  and sign release of information. `

## 2023-10-01 DIAGNOSIS — Z0279 Encounter for issue of other medical certificate: Secondary | ICD-10-CM

## 2023-10-02 LAB — CBC WITH DIFFERENTIAL/PLATELET
Basophils Absolute: 0 10*3/uL (ref 0.0–0.2)
Basos: 1 %
EOS (ABSOLUTE): 0.2 10*3/uL (ref 0.0–0.4)
Eos: 3 %
Hematocrit: 43.3 % (ref 37.5–51.0)
Hemoglobin: 13.4 g/dL (ref 13.0–17.7)
Immature Grans (Abs): 0 10*3/uL (ref 0.0–0.1)
Immature Granulocytes: 0 %
Lymphocytes Absolute: 2.6 10*3/uL (ref 0.7–3.1)
Lymphs: 44 %
MCH: 26.2 pg — ABNORMAL LOW (ref 26.6–33.0)
MCHC: 30.9 g/dL — ABNORMAL LOW (ref 31.5–35.7)
MCV: 85 fL (ref 79–97)
Monocytes Absolute: 0.5 10*3/uL (ref 0.1–0.9)
Monocytes: 9 %
Neutrophils Absolute: 2.5 10*3/uL (ref 1.4–7.0)
Neutrophils: 43 %
Platelets: 272 10*3/uL (ref 150–450)
RBC: 5.12 x10E6/uL (ref 4.14–5.80)
RDW: 14.5 % (ref 11.6–15.4)
WBC: 5.8 10*3/uL (ref 3.4–10.8)

## 2023-10-02 LAB — HEPATIC FUNCTION PANEL
ALT: 19 IU/L (ref 0–44)
AST: 21 IU/L (ref 0–40)
Albumin: 4.4 g/dL (ref 3.8–4.9)
Alkaline Phosphatase: 60 IU/L (ref 44–121)
Bilirubin Total: <0.2 mg/dL (ref 0.0–1.2)
Bilirubin, Direct: 0.08 mg/dL (ref 0.00–0.40)
Total Protein: 6.4 g/dL (ref 6.0–8.5)

## 2023-10-04 ENCOUNTER — Ambulatory Visit: Payer: Self-pay | Admitting: Podiatry

## 2023-10-04 ENCOUNTER — Encounter: Payer: Self-pay | Admitting: Physician Assistant

## 2023-10-05 ENCOUNTER — Ambulatory Visit
Admission: RE | Admit: 2023-10-05 | Discharge: 2023-10-05 | Disposition: A | Discharge status: Home / Self Care | Source: Ambulatory Visit | Attending: Physician Assistant | Admitting: Physician Assistant

## 2023-10-05 DIAGNOSIS — I739 Peripheral vascular disease, unspecified: Secondary | ICD-10-CM

## 2023-10-08 ENCOUNTER — Other Ambulatory Visit

## 2023-10-12 NOTE — Telephone Encounter (Signed)
 Follow Up:      Wyatt Sandoval is calling to check on the status of paper work. She said they need this asap please.r

## 2023-10-12 NOTE — Telephone Encounter (Signed)
 I called [employer] and left a message with patient's case worker.  I let her know that Dr. Avanell Bob will not be back until 10/19/2023 and that she would address the form then.

## 2023-10-19 NOTE — Telephone Encounter (Signed)
 Wyatt Sandoval with [employer] called in about this paperwork. She states they need the paperwork by 10/22/23. Please advise.

## 2023-10-19 NOTE — Telephone Encounter (Signed)
 Completed forms placed back in the box to finish processing.

## 2023-10-22 NOTE — Telephone Encounter (Signed)
 [employer] form faxed to insurance and scanned to chart. Billing and patient notified.

## 2023-11-04 ENCOUNTER — Telehealth: Payer: Self-pay

## 2023-11-04 NOTE — Telephone Encounter (Signed)
 Spoke with patient he is aware [employer] will reject if inserts are not for Diabetic or attached to a brace, we discussed pricing and OTS options will FU with us  at next appt

## 2023-12-07 ENCOUNTER — Ambulatory Visit: Admitting: Podiatry

## 2023-12-13 ENCOUNTER — Ambulatory Visit: Admitting: Podiatry

## 2023-12-13 DIAGNOSIS — M722 Plantar fascial fibromatosis: Secondary | ICD-10-CM

## 2023-12-13 DIAGNOSIS — Z79899 Other long term (current) drug therapy: Secondary | ICD-10-CM

## 2023-12-13 DIAGNOSIS — B351 Tinea unguium: Secondary | ICD-10-CM | POA: Diagnosis not present

## 2023-12-13 DIAGNOSIS — M79674 Pain in right toe(s): Secondary | ICD-10-CM

## 2023-12-13 DIAGNOSIS — E1149 Type 2 diabetes mellitus with other diabetic neurological complication: Secondary | ICD-10-CM

## 2023-12-13 DIAGNOSIS — M79675 Pain in left toe(s): Secondary | ICD-10-CM

## 2023-12-13 MED ORDER — TERBINAFINE HCL 250 MG PO TABS: 250.0000 mg | ORAL_TABLET | Freq: Every day | ORAL | 0 refills | Status: DC

## 2023-12-13 NOTE — Progress Notes (Signed)
 Subjective:   Patient ID: Wyatt Sandoval, male   DOB: 63 y.o.   MRN: [id number]   HPI Chief Complaint  Patient presents with   Plantar fibromatosis    Pt stated that things are getting a little better he stated that he has no new concerns       63 year old male presents the office today for the above concerns.  He presents today for thick, elongated nails that he is not able to trim himself. Does not report any drainage or open lesions.  He also still gets bumps on the feet are doing much better.  They are smaller not causing any discomfort.  He is asking if he still needs to get inserts.  No recent injuries or changes otherwise.   Review of Systems  All other systems reviewed and are negative.      Objective:  Physical Exam  General: AAO x3, NAD  Dermatological: Nails are hypertrophic, dystrophic, brittle, discolored, elongated 10. No surrounding redness or drainage. Tenderness nails 1-5 bilaterally. No open lesions or pre-ulcerative lesions are identified today.    Vascular: Dorsalis Pedis artery and Posterior Tibial artery pedal pulses are 2/4 bilateral with immedate capillary fill time. There is no pain with calf compression, swelling, warmth, erythema.   Neruologic: Grossly intact via light touch bilateral.    Musculoskeletal: On the plantar medial aspect of the arch of the foot along the plantar fascia and the arch of the foot are firm nodules consistent with plantar fibromas.  They appear to be smaller today they are not causing any discomfort.  There is no pain in the area.  No edema, erythema.  Gait: Unassisted, Nonantalgic.       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 - Discussed medications for nail fungus.  Previously prescribed Lamisil .  Discussed another round of Lamisil .  Will check CBC, LFT.  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  I did recommend continue with the compound cream through [employer].  Either did better reason hold off on custom inserts for now.  Discussed continue stretching exercises daily.    Donnice JONELLE Fees DPM

## 2023-12-13 NOTE — Patient Instructions (Signed)

## 2023-12-14 LAB — CBC WITH DIFFERENTIAL/PLATELET
Basophils Absolute: 0 x10E3/uL (ref 0.0–0.2)
Basos: 1 %
EOS (ABSOLUTE): 0.2 x10E3/uL (ref 0.0–0.4)
Eos: 3 %
Hematocrit: 45.9 % (ref 37.5–51.0)
Hemoglobin: 14.3 g/dL (ref 13.0–17.7)
Immature Grans (Abs): 0 x10E3/uL (ref 0.0–0.1)
Immature Granulocytes: 0 %
Lymphocytes Absolute: 2.4 x10E3/uL (ref 0.7–3.1)
Lymphs: 40 %
MCH: 26 pg — ABNORMAL LOW (ref 26.6–33.0)
MCHC: 31.2 g/dL — ABNORMAL LOW (ref 31.5–35.7)
MCV: 83 fL (ref 79–97)
Monocytes Absolute: 0.4 x10E3/uL (ref 0.1–0.9)
Monocytes: 7 %
Neutrophils Absolute: 2.8 x10E3/uL (ref 1.4–7.0)
Neutrophils: 49 %
Platelets: 267 x10E3/uL (ref 150–450)
RBC: 5.51 x10E6/uL (ref 4.14–5.80)
RDW: 14.2 % (ref 11.6–15.4)
WBC: 5.9 x10E3/uL (ref 3.4–10.8)

## 2023-12-14 LAB — HEPATIC FUNCTION PANEL
ALT: 20 IU/L (ref 0–44)
AST: 23 IU/L (ref 0–40)
Albumin: 4.6 g/dL (ref 3.8–4.9)
Alkaline Phosphatase: 52 IU/L (ref 44–121)
Bilirubin Total: <0.2 mg/dL (ref 0.0–1.2)
Bilirubin, Direct: <0.08 mg/dL (ref 0.00–0.40)
Total Protein: 6.7 g/dL (ref 6.0–8.5)

## 2023-12-16 ENCOUNTER — Other Ambulatory Visit: Payer: Self-pay | Admitting: Podiatry

## 2023-12-16 ENCOUNTER — Ambulatory Visit: Payer: Self-pay | Admitting: Podiatry

## 2023-12-16 DIAGNOSIS — Z79899 Other long term (current) drug therapy: Secondary | ICD-10-CM

## 2023-12-16 MED ORDER — TERBINAFINE HCL 250 MG PO TABS: 250.0000 mg | ORAL_TABLET | Freq: Every day | ORAL | 0 refills | Status: DC

## 2023-12-23 ENCOUNTER — Telehealth: Payer: Self-pay | Admitting: Internal Medicine

## 2023-12-23 DIAGNOSIS — F17200 Nicotine dependence, unspecified, uncomplicated: Secondary | ICD-10-CM

## 2023-12-23 NOTE — Telephone Encounter (Signed)
*  STAT* If patient is at the pharmacy, call can be transferred to refill team.   1. Which medications need to be refilled? (please list name of each medication and dose if known)   varenicline  (CHANTIX ) 1 MG tablet     2. Would you like to learn more about the convenience, safety, & potential cost savings by using the [facility]? No   3. Are you open to using the [facility] (Type [facility]. ) No   4. Which pharmacy/location (including street and city if local pharmacy) is medication to be sent to? [id number] - [place], [place] - [address] AT [facility] [address]    5. Do they need a 30 day or 90 day supply? 90 day  Pt is out of medication

## 2023-12-23 NOTE — Telephone Encounter (Signed)
 Pt of Dr. Okey. Requesting a refill of Chantix . Please advise.

## 2023-12-23 NOTE — Telephone Encounter (Signed)
 OK to fill

## 2023-12-24 MED ORDER — VARENICLINE TARTRATE 1 MG PO TABS: ORAL_TABLET | ORAL | 0 refills | Status: DC

## 2023-12-24 NOTE — Telephone Encounter (Signed)
Pt's medication was sent to pt's pharmacy as requested confirmation received.  °

## 2023-12-31 ENCOUNTER — Other Ambulatory Visit: Payer: Self-pay | Admitting: Internal Medicine

## 2024-03-13 NOTE — Progress Notes (Incomplete)
 Assessment/Plan:    Mild cognitive impairment of uncertain or unknown etiology   Wyatt Sandoval is a very pleasant 63 y.o. RH male with a history oflabile hypertension, hyperlipidemia, DM2,  CAD, ASCVD s/p L CEA (RICAs 1-39%), Hep C (treated 2018), prior alcohol and tobacco abuse, anxiety, depression and a diagnosis of mild cognitive impairment  of uncertain or unknown etiology, perhaps with a vascular component, but unlikely due to neurodegenerative process, per neuropsych evaluation presenting today in follow-up for evaluation of memory loss. Patient is on donepezil  5 mg daily, tolerating well. ***.     Recommendations:   Follow up in   months. Continue pain control at pain clinic, monitor medicines, as this could affect his memory Recommend good control of cardiovascular risk factors, follow asymptomatic oH and labile HTN Continue to control mood as per PCP    Subjective:   This patient is accompanied in the office by ***  who supplements the history. Previous records as well as any outside records available were reviewed prior to todays visit.   Patient was last seen on 03/16/2023 ***.    Any changes in memory since last visit? . Able to retain parts of the conversation, not all of the content. Sometimes high anxiety may interfere with his memory.  repeats oneself?  Endorsed Disoriented when walking into a room?  Patient denies    Misplacing objects?  Patient denies   Wandering behavior?   Denies. Any personality changes since last visit? As before, he reports some anxiety and moments of irritability. Any worsening depression?: denies.   Hallucinations or paranoia?  Denies.   Seizures?   Denies.    Any sleep changes? Sleeps well***. Does not sleep very well***.   Denies vivid dreams, REM behavior or sleepwalking   Sleep apnea?   denies ***  Any hygiene concerns?   Denies.   Independent of bathing and dressing?  Endorsed  Does the patient needs help with medications?  Patient is in charge *** Who is in charge of the finances?  Patient is in charge   *** Any changes in appetite?  denies ***   Patient have trouble swallowing?  Denies.   Does the patient cook?  Any kitchen accidents such as leaving the stove on?   Denies.   Any headaches?  HE has chronic tension headaches, especially if  BP is high   Vision changes? Denies. Chronic pain?  Denies.   Ambulates with difficulty?    Denies. ***  Recent falls or head injuries?    Denies.      Unilateral weakness, numbness or tingling?  Denies.   Any tremors?  Denies.   Any anosmia?    Denies.   Any incontinence of urine?  Denies.   Any bowel dysfunction?  Denies.      Patient lives with fiancee .*** Does the patient drive? Yes, denies getting lost*** Works as a engineer, water 3 times a week ***   Initial evaluation 03/12/2022  How long did patient have memory difficulties? About 5 years when he began forgetting how to do tasks, needs to be re-instructed to do something.  He reports both short-term and long-term memory difficulties.  He also is able to remember parts of the conversation, but not all of it.   He also reports that high anxiety interferes with his thinking. repeats oneself?  Not so much  Disoriented when walking into a room?  Patient denies   Leaving objects in unusual places?  I don't remember where I  put stuff, but not in unusual places sometimes I lose my wallet and I find it 2 days later  Ambulates  with difficulty?  Sometimes, due to [facility]  Recent falls?  Patient denies   Any head injuries?  Patient denies   History of seizures?   Patient denies   Wandering behavior?  Patient denies                                    Patient drives?   Occasionally he gets lost, needs more than before his GPS. Any mood changes such irritability agitation? A little bit Admits to being too mad sometimes.  If somebody has a superior degree or jaw, he becomes very anxious and lose my memory Any history of  depression?:  Patient denies although everybody gets a little down sometimes  Hallucinations?  Patient denies   Paranoia? A little bit, sometimes I am afraid my wallet would be stolen Patient reports that has insomnia, and takes 3 medications to help with sleep.  He had a recent sleep study, with borderline sleep apnea , he reports that does not require CPAP.  He denies any vivid dreams, REM behavior or sleepwalking.  History of sleep apnea? Borderline sleep apnea  Any hygiene concerns?  Patient denies   Independent of bathing and dressing?  Endorsed Does the patient needs help with medications? Patient in charge   Who is in charge of the finances?  Patient is in charge  Any changes in appetite?  Patient denies    Patient have trouble swallowing? Patient denies   Does the patient cook?  Patient denies   Any kitchen accidents such as leaving the stove on? Patient denies   Any headaches?  Endorsed, chronic, tension type, worse with higher BP.  The double vision? Patient denies   Any focal numbness or tingling?  Patient denies   Chronic back pain endorsed, chronic lower back pain from prior work accidents in the 1990s, followed at the pain clinic.  Unilateral weakness?  Patient denies   Any tremors?  Patient denies   Any history of anosmia?  Patient denies   Any incontinence of urine?  Patient denies   Any bowel dysfunction?  Constipation due to pain pills  History of heavy alcohol intake? Gets drunk once a week a fifth, sometimes more and black out, some liquor during the week .  He is thinking about rejoining AA    History of heavy tobacco use?  Endorsed, trying to quit. 1 cig a day  Family history of dementia? Mother has dementia of unknown type Patient lives alone     Neuropsych evaluation 01/2023 Briefly, scores across stand-alone and embedded performance validity measures were variable. I do not believe that there were attempts to perform poorly or evidence for poor  engagement throughout testing. Rather, Wyatt Sandoval appeared quite anxious and overwhelmed at times, to the extent that the testing battery had to be adjusted to ensure a completed battery could be obtained given concerns for testing discontinuation. The acute and significant nature of these experiences could explain variable validity concerns. If taken at face value, Wyatt Sandoval pattern of performance is suggestive of an isolated impairment surrounding executive functioning. Additionally, notable performance variability was exhibited across nearly all assessed cognitive domains. This included processing speed, attention/concentration, phonemic fluency, visuospatial abilities, and all aspects of learning and memory. Developmentally, Wyatt Sandoval described longstanding academic difficulties and regular enrollment  in special education courses prior to leaving school after completing the 10th grade. There is a somewhat remote history of what is likely prominent alcohol dependence. As stated above, acute anxiety appeared quite elevated throughout the testing process. While he denied anxiety during the current interview, he has acknowledged that anxiety will worsen day-to-day cognitive functioning to other medical providers recently. He reported acute levels of mild depression across related questionnaires. He also reported ongoing sleep dysfunction during interview and across a related questionnaire. Furthermore, his most recent neuroimaging revealed mild microvascular ischemic disease and potential lacunar infarctions within the bilateral caudate nuclei. The combination of these factors remain a plausible explanation for both performance variability across testing and subjective day-to-day difficulties. This would appear more likely than a purely neurological cause at the present time.     MRI of the brain 03/18/2022  personally reviewed, remarkable for  chronic small vessel ischemia, prominent perivascular spaces  versus small chronic lacunar infarcts within the bilateral caudate nuclei.      Past Medical History:  Diagnosis Date   Bilateral carotid artery disease 10/01/2015   L-CEA 11/07/2015 > 07/27/2016, 1-39% R-ICA stenosis and no left ICA stenosis > 08/20/2017 L-ICA 40-59% stenosis and R-ICA unchanged 1-39%     Carpal tunnel syndrome, bilateral 02/17/2011   Chronic back pain 09/15/2010   Seen by pain Clinic in [facility]     Chronic obstructive pulmonary disease 05/09/2020   Contact dermatitis 02/14/2015   Diabetic neuropathy 08/09/2020   Dizziness 08/24/2017   Dyshidrotic eczema 07/08/2012   Essential hypertension 02/12/2012   Gallbladder polyp 03/13/2013   4 mm polyp on gallbladder ultrasound September 2014, repeat ultrasound September 2015     Gastroesophageal reflux disease 11/07/2018   Hepatitis C    Insomnia    Joint pain 11/07/2018   Left-sided carotid artery disease    Mild cognitive impairment of uncertain or unknown etiology 01/08/2023   Mixed hyperlipidemia 07/01/2006   Open-angle glaucoma 05/11/2013   Bilateral eyes. Mild (new). Lumigan  0.01% 1 drop HS bilateral eyes. Check up every 6 months. Managed by Dr. Arley Ruder.  Nuclear sclerosis Cataract OU (stable)  Dependent DM OU-- Stable     Skin lesion of left arm 11/23/2016   Type 2 diabetes mellitus 07/01/2006   Wears glasses      Past Surgical History:  Procedure Laterality Date   ANTERIOR CERVICAL DECOMP/DISCECTOMY FUSION  2001; 2002   BACK SURGERY     CARDIAC CATHETERIZATION N/A 08/26/2015   Procedure: Left Heart Cath and Coronary Angiography;  Surgeon: Candyce GORMAN Reek, MD;  Location: [facility] INVASIVE CV LAB;  Service: Cardiovascular;  Laterality: N/A;   CAROTID ENDARTERECTOMY Left 11/07/2015   CARPAL TUNNEL RELEASE Right ~ 2015   COLONOSCOPY     ENDARTERECTOMY Left 11/07/2015   Procedure: LEFT CAROTID ENDARTERECTOMY ;  Surgeon: Gaile LELON New, MD;  Location: [facility] OR;  Service: Vascular;  Laterality: Left;      PREVIOUS MEDICATIONS:   CURRENT MEDICATIONS:  Outpatient Encounter Medications as of 03/15/2024  Medication Sig   terbinafine  (LAMISIL ) 250 MG tablet Take 1 tablet (250 mg total) by mouth daily.   albuterol (VENTOLIN HFA) 108 (90 Base) MCG/ACT inhaler 2 puffs every 4 (four) hours as needed for wheezing or shortness of breath.   amitriptyline  (ELAVIL ) 150 MG tablet TAKE 1 TABLET(150 MG) BY MOUTH AT BEDTIME   cloNIDine  (CATAPRES ) 0.1 MG tablet TAKE 1 TABLET BY MOUTH AS NEEDED FOR BLOOD PRESSURE GREATER THAN 165/95   donepezil  (ARICEPT ) 5 MG  tablet Take 1 tablet (5 mg total) by mouth every morning.   enalapril  (VASOTEC ) 5 MG tablet TAKE 1 TABLET(5 MG) BY MOUTH DAILY   gabapentin  (NEURONTIN ) 300 MG capsule Take 1 capsule (300 mg total) by mouth daily.   hydrOXYzine (ATARAX) 10 MG tablet hydroxyzine HCl 10 mg tablet  TAKE 1 TABLET BY MOUTH EVERY DAY   ketoconazole  (NIZORAL ) 2 % cream Apply 1 Application topically daily.   metFORMIN  (GLUCOPHAGE ) 1000 MG tablet Take one tablet by mouth twice daily   methocarbamol  (ROBAXIN ) 500 MG tablet Take 2 tablets (1,000 mg total) by mouth every 8 (eight) hours as needed for muscle spasms.   metoprolol  tartrate (LOPRESSOR ) 25 MG tablet Take 1 tablet (25 mg total) by mouth 2 (two) times daily.   nitroGLYCERIN  (NITROSTAT ) 0.4 MG SL tablet DISSOLVE 1 TABLET UNDER THE TONGUE EVERY 5 MINUTES AS NEEDED FOR CHEST PAIN. TAKE UP TO 3 DOSES AS DIRECTED   omeprazole  (PRILOSEC) 40 MG capsule Take 1 capsule (40 mg total) by mouth daily.   oxyCODONE  (ROXICODONE ) 15 MG immediate release tablet Take 1 tablet (15 mg total) by mouth every 4 (four) hours as needed for pain.   predniSONE  (DELTASONE ) 20 MG tablet 3 Tabs PO Days 1-3, then 2 tabs PO Days 4-6, then 1 tab PO Day 7-9, then Half Tab PO Day 10-12   rosuvastatin  (CRESTOR ) 20 MG tablet TAKE 1 TABLET(20 MG) BY MOUTH DAILY   traZODone  (DESYREL ) 100 MG tablet Take 100 mg by mouth at bedtime.   triamcinolone  cream (KENALOG )  0.5 % Apply 1 application topically 2 (two) times daily.   varenicline  (CHANTIX ) 1 MG tablet TAKE 1 TABLET(1 MG) BY MOUTH TWICE DAILY WITH A MEAL   No facility-administered encounter medications on file as of 03/15/2024.     Objective:     PHYSICAL EXAMINATION:    VITALS:  There were no vitals filed for this visit.  GEN:  The patient appears stated age and is in NAD. HEENT:  Normocephalic, atraumatic.   Neurological examination:  General: NAD, well-groomed, appears stated age. Orientation: The patient is alert. Oriented to person, place and not to date.*** Cranial nerves: There is good facial symmetry. Anxious appearing. The speech is fluent and clear. No aphasia or dysarthria. Fund of knowledge is appropriate. Recent memory and remote memory is normal.  Attention and concentration are normal.  Able to name objects and repeat phrases.  Hearing is intact to conversational tone ***.   Delayed recall *** Sensation: Sensation is intact to light touch throughout Motor: Strength is at least antigravity x4. DTR's 2/4 in UE/LE      03/12/2022   10:00 AM  Montreal Cognitive Assessment   Visuospatial/ Executive (0/5) 2  Naming (0/3) 3  Attention: Read list of digits (0/2) 2  Attention: Read list of letters (0/1) 1  Attention: Serial 7 subtraction starting at 100 (0/3) 1  Language: Repeat phrase (0/2) 0  Language : Fluency (0/1) 1  Abstraction (0/2) 2  Delayed Recall (0/5) 2  Orientation (0/6) 6  Total 20  Adjusted Score (based on education) 21       08/27/2010    2:00 PM  MMSE - Mini Mental State Exam  Orientation to time 5   Orientation to Place 5   Registration 3   Attention/ Calculation 5   Recall 3   Language- name 2 objects 2   Language- repeat 1  Language- follow 3 step command 3   Language- read & follow direction 1  Write a sentence 1   Copy design 1   Total score 30      Data saved with a previous flowsheet row definition       Movement  examination: Tone: There is normal tone in the UE/LE Abnormal movements:  no tremor.  No myoclonus.  No asterixis.   Coordination:  There is no decremation with RAM's. Normal finger to nose  Gait and Station: The patient has no difficulty arising out of a deep-seated chair without the use of the hands. The patient's stride length is good.  Gait is cautious and narrow.   Thank you for allowing us  the opportunity to participate in the care of this nice patient. Please do not hesitate to contact us  for any questions or concerns.   Total time spent on today's visit was *** minutes dedicated to this patient today, preparing to see patient, examining the patient, ordering tests and/or medications and counseling the patient, documenting clinical information in the EHR or other health record, independently interpreting results and communicating results to the patient/family, discussing treatment and goals, answering patient's questions and coordinating care.  Cc:  Morene Rouse, PA  [facility] 03/13/2024 10:28 AM

## 2024-03-14 ENCOUNTER — Ambulatory Visit: Admitting: Podiatry

## 2024-03-14 ENCOUNTER — Encounter: Payer: Self-pay | Admitting: Podiatry

## 2024-03-14 DIAGNOSIS — Z79899 Other long term (current) drug therapy: Secondary | ICD-10-CM

## 2024-03-14 DIAGNOSIS — E1149 Type 2 diabetes mellitus with other diabetic neurological complication: Secondary | ICD-10-CM

## 2024-03-14 DIAGNOSIS — M79675 Pain in left toe(s): Secondary | ICD-10-CM | POA: Diagnosis not present

## 2024-03-14 DIAGNOSIS — M79674 Pain in right toe(s): Secondary | ICD-10-CM | POA: Diagnosis not present

## 2024-03-14 DIAGNOSIS — M722 Plantar fascial fibromatosis: Secondary | ICD-10-CM | POA: Diagnosis not present

## 2024-03-14 DIAGNOSIS — B351 Tinea unguium: Secondary | ICD-10-CM

## 2024-03-14 NOTE — Patient Instructions (Signed)
 Plantar Fasciitis (Heel Spur Syndrome) with Rehab The plantar fascia is a fibrous, ligament-like, soft-tissue structure that spans the bottom of the foot. Plantar fasciitis is a condition that causes pain in the foot due to inflammation of the tissue. SYMPTOMS  Pain and tenderness on the underneath side of the foot. Pain that worsens with standing or walking. CAUSES  Plantar fasciitis is caused by irritation and injury to the plantar fascia on the underneath side of the foot. Common mechanisms of injury include: Direct trauma to bottom of the foot. Damage to a small nerve that runs under the foot where the main fascia attaches to the heel bone. Stress placed on the plantar fascia due to bone spurs. RISK INCREASES WITH:  Activities that place stress on the plantar fascia (running, jumping, pivoting, or cutting). Poor strength and flexibility. Improperly fitted shoes. Tight calf muscles. Flat feet. Failure to warm-up properly before activity. Obesity. PREVENTION Warm up and stretch properly before activity. Allow for adequate recovery between workouts. Maintain physical fitness: Strength, flexibility, and endurance. Cardiovascular fitness. Maintain a health body weight. Avoid stress on the plantar fascia. Wear properly fitted shoes, including arch supports for individuals who have flat feet.  PROGNOSIS  If treated properly, then the symptoms of plantar fasciitis usually resolve without surgery. However, occasionally surgery is necessary.  RELATED COMPLICATIONS  Recurrent symptoms that may result in a chronic condition. Problems of the lower back that are caused by compensating for the injury, such as limping. Pain or weakness of the foot during push-off following surgery. Chronic inflammation, scarring, and partial or complete fascia tear, occurring more often from repeated injections.  TREATMENT  Treatment initially involves the use of ice and medication to help reduce pain and  inflammation. The use of strengthening and stretching exercises may help reduce pain with activity, especially stretches of the Achilles tendon. These exercises may be performed at home or with a therapist. Your caregiver may recommend that you use heel cups of arch supports to help reduce stress on the plantar fascia. Occasionally, corticosteroid injections are given to reduce inflammation. If symptoms persist for greater than 6 months despite non-surgical (conservative), then surgery may be recommended.   MEDICATION  If pain medication is necessary, then nonsteroidal anti-inflammatory medications, such as aspirin and ibuprofen, or other minor pain relievers, such as acetaminophen, are often recommended. Do not take pain medication within 7 days before surgery. Prescription pain relievers may be given if deemed necessary by your caregiver. Use only as directed and only as much as you need. Corticosteroid injections may be given by your caregiver. These injections should be reserved for the most serious cases, because they may only be given a certain number of times.  HEAT AND COLD Cold treatment (icing) relieves pain and reduces inflammation. Cold treatment should be applied for 10 to 15 minutes every 2 to 3 hours for inflammation and pain and immediately after any activity that aggravates your symptoms. Use ice packs or massage the area with a piece of ice (ice massage). Heat treatment may be used prior to performing the stretching and strengthening activities prescribed by your caregiver, physical therapist, or athletic trainer. Use a heat pack or soak the injury in warm water.  SEEK IMMEDIATE MEDICAL CARE IF: Treatment seems to offer no benefit, or the condition worsens. Any medications produce adverse side effects.  EXERCISES- RANGE OF MOTION (ROM) AND STRETCHING EXERCISES - Plantar Fasciitis (Heel Spur Syndrome) These exercises may help you when beginning to rehabilitate your injury. Your  symptoms may resolve with or without further involvement from your physician, physical therapist or athletic trainer. While completing these exercises, remember:  Restoring tissue flexibility helps normal motion to return to the joints. This allows healthier, less painful movement and activity. An effective stretch should be held for at least 30 seconds. A stretch should never be painful. You should only feel a gentle lengthening or release in the stretched tissue.  RANGE OF MOTION - Toe Extension, Flexion Sit with your right / left leg crossed over your opposite knee. Grasp your toes and gently pull them back toward the top of your foot. You should feel a stretch on the bottom of your toes and/or foot. Hold this stretch for 10 seconds. Now, gently pull your toes toward the bottom of your foot. You should feel a stretch on the top of your toes and or foot. Hold this stretch for 10 seconds. Repeat  times. Complete this stretch 3 times per day.   RANGE OF MOTION - Ankle Dorsiflexion, Active Assisted Remove shoes and sit on a chair that is preferably not on a carpeted surface. Place right / left foot under knee. Extend your opposite leg for support. Keeping your heel down, slide your right / left foot back toward the chair until you feel a stretch at your ankle or calf. If you do not feel a stretch, slide your bottom forward to the edge of the chair, while still keeping your heel down. Hold this stretch for 10 seconds. Repeat 3 times. Complete this stretch 2 times per day.   STRETCH  Gastroc, Standing Place hands on wall. Extend right / left leg, keeping the front knee somewhat bent. Slightly point your toes inward on your back foot. Keeping your right / left heel on the floor and your knee straight, shift your weight toward the wall, not allowing your back to arch. You should feel a gentle stretch in the right / left calf. Hold this position for 10 seconds. Repeat 3 times. Complete this  stretch 2 times per day.  STRETCH  Soleus, Standing Place hands on wall. Extend right / left leg, keeping the other knee somewhat bent. Slightly point your toes inward on your back foot. Keep your right / left heel on the floor, bend your back knee, and slightly shift your weight over the back leg so that you feel a gentle stretch deep in your back calf. Hold this position for 10 seconds. Repeat 3 times. Complete this stretch 2 times per day.  STRETCH  Gastrocsoleus, Standing  Note: This exercise can place a lot of stress on your foot and ankle. Please complete this exercise only if specifically instructed by your caregiver.  Place the ball of your right / left foot on a step, keeping your other foot firmly on the same step. Hold on to the wall or a rail for balance. Slowly lift your other foot, allowing your body weight to press your heel down over the edge of the step. You should feel a stretch in your right / left calf. Hold this position for 10 seconds. Repeat this exercise with a slight bend in your right / left knee. Repeat 3 times. Complete this stretch 2 times per day.   STRENGTHENING EXERCISES - Plantar Fasciitis (Heel Spur Syndrome)  These exercises may help you when beginning to rehabilitate your injury. They may resolve your symptoms with or without further involvement from your physician, physical therapist or athletic trainer. While completing these exercises, remember:  Muscles can  gain both the endurance and the strength needed for everyday activities through controlled exercises. Complete these exercises as instructed by your physician, physical therapist or athletic trainer. Progress the resistance and repetitions only as guided.  STRENGTH - Towel Curls Sit in a chair positioned on a non-carpeted surface. Place your foot on a towel, keeping your heel on the floor. Pull the towel toward your heel by only curling your toes. Keep your heel on the floor. Repeat 3 times.  Complete this exercise 2 times per day.  STRENGTH - Ankle Inversion Secure one end of a rubber exercise band/tubing to a fixed object (table, pole). Loop the other end around your foot just before your toes. Place your fists between your knees. This will focus your strengthening at your ankle. Slowly, pull your big toe up and in, making sure the band/tubing is positioned to resist the entire motion. Hold this position for 10 seconds. Have your muscles resist the band/tubing as it slowly pulls your foot back to the starting position. Repeat 3 times. Complete this exercises 2 times per day.  Document Released: 04/20/2005 Document Revised: 07/13/2011 Document Reviewed: 08/02/2008 [facility] Patient Information 2014 [employer], [place].  --  Terbinafine Tablets What is this medication? TERBINAFINE (TER bin a feen) treats fungal infections of the nails. It belongs to a group of medications called antifungals. It will not treat infections caused by bacteria or viruses. This medicine may be used for other purposes; ask your health care provider or pharmacist if you have questions. COMMON BRAND NAME(S): Lamisil, Terbinex What should I tell my care team before I take this medication? They need to know if you have any of these conditions: Liver disease An unusual or allergic reaction to terbinafine, other medications, foods, dyes, or preservatives Pregnant or trying to get pregnant Breast-feeding How should I use this medication? Take this medication by mouth with water. Take it as directed on the prescription label at the same time every day. You can take it with or without food. If it upsets your stomach, take it with food. Keep taking it unless your care team tells you to stop. A special MedGuide will be given to you by the pharmacist with each prescription and refill. Be sure to read this information carefully each time. Talk to your care team about the use of this medication in children. Special  care may be needed. Overdosage: If you think you have taken too much of this medicine contact a poison control center or emergency room at once. NOTE: This medicine is only for you. Do not share this medicine with others. What if I miss a dose? If you miss a dose, take it as soon as you can unless it is more than 4 hours late. If it is more than 4 hours late, skip the missed dose. Take the next dose at the normal time. What may interact with this medication? Do not take this medication with any of the following: Pimozide Thioridazine This medication may also interact with the following: Beta blockers Caffeine Certain medications for mental health conditions Cimetidine Cyclosporine Medications for fungal infections, such as fluconazole or ketoconazole Medications for irregular heartbeat, such as amiodarone, flecainide, propafenone Rifampin Warfarin This list may not describe all possible interactions. Give your health care provider a list of all the medicines, herbs, non-prescription drugs, or dietary supplements you use. Also tell them if you smoke, drink alcohol, or use illegal drugs. Some items may interact with your medicine. What should I watch for while using  this medication? Visit your care team for regular checks on your progress. Tell your care team if your symptoms do not start to get better or if they get worse. This medication may cause serious skin reactions. They can happen weeks to months after starting the medication. Contact your care team right away if you notice fevers or flu-like symptoms with a rash. The rash may be red or purple and then turn into blisters or peeling of the skin. You may also notice a red rash with swelling of the face, lips, or lymph nodes in your neck or under your arms. This medication can make you more sensitive to the sun. Keep out of the sun. If you cannot avoid being in the sun, wear protective clothing and sunscreen. Do not use sun lamps, tanning  beds, or tanning booths. What side effects may I notice from receiving this medication? Side effects that you should report to your care team as soon as possible: Allergic reactions--skin rash, itching, hives, swelling of the face, lips, tongue, or throat Change in sense of smell Change in taste Infection--fever, chills, cough, or sore throat Liver injury--right upper belly pain, loss of appetite, nausea, light-colored stool, dark yellow or brown urine, yellowing skin or eyes, unusual weakness or fatigue Low red blood cell level--unusual weakness or fatigue, dizziness, headache, trouble breathing Lupus-like syndrome--joint pain, swelling, or stiffness, butterfly-shaped rash on the face, rashes that get worse in the sun, fever, unusual weakness or fatigue Rash, fever, and swollen lymph nodes Redness, blistering, peeling, or loosening of the skin, including inside the mouth Unusual bruising or bleeding Worsening mood, feelings of depression Side effects that usually do not require medical attention (report to your care team if they continue or are bothersome): Diarrhea Gas Headache Nausea Stomach pain Upset stomach This list may not describe all possible side effects. Call your doctor for medical advice about side effects. You may report side effects to FDA at 1-800-FDA-1088. Where should I keep my medication? Keep out of the reach of children and pets. Store between 20 and 25 degrees C (68 and 77 degrees F). Protect from light. Get rid of any unused medication after the expiration date. To get rid of medications that are no longer needed or have expired: Take the medication to a medication take-back program. Check with your pharmacy or law enforcement to find a location. If you cannot return the medication, check the label or package insert to see if the medication should be thrown out in the garbage or flushed down the toilet. If you are not sure, ask your care team. If it is safe to put  it in the trash, take the medication out of the container. Mix the medication with cat litter, dirt, coffee grounds, or other unwanted substance. Seal the mixture in a bag or container. Put it in the trash. NOTE: This sheet is a summary. It may not cover all possible information. If you have questions about this medicine, talk to your doctor, pharmacist, or health care provider.  2024 Elsevier/Gold Standard (2022-11-06 00:00:00)

## 2024-03-14 NOTE — Progress Notes (Signed)
 Subjective:   Patient ID: Wyatt Sandoval, male   DOB: 63 y.o.   MRN: [id number]   HPI Chief Complaint  Patient presents with   [facility]    Rm13 Diabetic foot care/ A68c 43     63 year old male presents the office today for the above concerns.  He presents today for thick, elongated nails that he is not able to trim himself. Does not report any drainage or open lesions.  He has been using the Lamisil  that any side effects.  He feels the nails may be getting better as far as the color.  He has no swelling or redness or drainage.  No open lesions or other concerns.  Patient also get discomfort to the arches of the feet.  He does not report any injuries or changes.  He does not feel the masses have come back.   Review of Systems  All other systems reviewed and are negative.      Objective:  Physical Exam  General: AAO x3, NAD  Dermatological: Nails are hypertrophic, dystrophic, brittle, discolored, elongated 10.  There are some clearing along the proximal nail folds.  No surrounding redness or drainage. Tenderness nails 1-5 bilaterally. No open lesions or pre-ulcerative lesions are identified today.    Vascular: Dorsalis Pedis artery and Posterior Tibial artery pedal pulses are 2/4 bilateral with immedate capillary fill time. There is no pain with calf compression, swelling, warmth, erythema.   Neruologic: Grossly intact via light touch bilateral.    Musculoskeletal: Plantar aspect of the arch of both feet are not able to palpate any nodules present.  There is no tenderness.  He states at times he still gets discomfort in the arches of the feet.  I am not able to appreciate any area of pinpoint tenderness.  MMT 5/5.  Gait: Unassisted, Nonantalgic.       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 - Discussed medications for nail fungus.   Previously prescribed Lamisil .  Discussed another round of Lamisil , likely additional 30 days.  Will check CBC, LFT.  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  No recurrence of this but still gets discomfort in the arch of the foot at times.  Discussed continue stretching exercises regularly as well as supportive shoe gear.  We also discussed rolling a frozen water bottle or something harder on the arches of the feet to help as well.  Return in 3 months (on 06/14/2024).  Donnice JONELLE Fees DPM   Donnice JONELLE Fees DPM

## 2024-03-15 ENCOUNTER — Ambulatory Visit: Payer: Medicare HMO | Admitting: Physician Assistant

## 2024-03-15 ENCOUNTER — Encounter: Payer: Self-pay | Admitting: Physician Assistant

## 2024-03-15 VITALS — BP 113/73 | HR 80 | Resp 20 | Ht 67.0 in | Wt 200.0 lb

## 2024-03-15 DIAGNOSIS — G3184 Mild cognitive impairment, so stated: Secondary | ICD-10-CM

## 2024-03-15 MED ORDER — DONEPEZIL HCL 10 MG PO TABS: 10.0000 mg | ORAL_TABLET | Freq: Every morning | ORAL | 3 refills | Status: AC

## 2024-03-15 NOTE — Patient Instructions (Signed)
 Take donepezil  in the morning 10 mg daily

## 2024-03-16 ENCOUNTER — Ambulatory Visit: Payer: Self-pay | Admitting: Podiatry

## 2024-03-16 ENCOUNTER — Other Ambulatory Visit: Payer: Self-pay | Admitting: Podiatry

## 2024-03-16 LAB — CBC WITH DIFFERENTIAL/PLATELET
Basophils Absolute: 0 x10E3/uL (ref 0.0–0.2)
Basos: 1 %
EOS (ABSOLUTE): 0.3 x10E3/uL (ref 0.0–0.4)
Eos: 4 %
Hematocrit: 45.6 % (ref 37.5–51.0)
Hemoglobin: 14.4 g/dL (ref 13.0–17.7)
Immature Grans (Abs): 0 x10E3/uL (ref 0.0–0.1)
Immature Granulocytes: 0 %
Lymphocytes Absolute: 2.4 x10E3/uL (ref 0.7–3.1)
Lymphs: 34 %
MCH: 25.2 pg — ABNORMAL LOW (ref 26.6–33.0)
MCHC: 31.6 g/dL (ref 31.5–35.7)
MCV: 80 fL (ref 79–97)
Monocytes Absolute: 0.8 x10E3/uL (ref 0.1–0.9)
Monocytes: 11 %
Neutrophils Absolute: 3.6 x10E3/uL (ref 1.4–7.0)
Neutrophils: 50 %
Platelets: 324 x10E3/uL (ref 150–450)
RBC: 5.72 x10E6/uL (ref 4.14–5.80)
RDW: 14.4 % (ref 11.6–15.4)
WBC: 7.2 x10E3/uL (ref 3.4–10.8)

## 2024-03-16 LAB — HEPATIC FUNCTION PANEL
ALT: 14 IU/L (ref 0–44)
AST: 20 IU/L (ref 0–40)
Albumin: 4.5 g/dL (ref 3.9–4.9)
Alkaline Phosphatase: 64 IU/L (ref 47–123)
Bilirubin Total: <0.2 mg/dL (ref 0.0–1.2)
Bilirubin, Direct: 0.09 mg/dL (ref 0.00–0.40)
Total Protein: 6.5 g/dL (ref 6.0–8.5)

## 2024-03-16 MED ORDER — TERBINAFINE HCL 250 MG PO TABS: 250.0000 mg | ORAL_TABLET | Freq: Every day | ORAL | 0 refills | Status: AC

## 2024-03-27 ENCOUNTER — Other Ambulatory Visit: Payer: Self-pay | Admitting: Internal Medicine

## 2024-04-03 ENCOUNTER — Other Ambulatory Visit: Payer: Self-pay | Admitting: Internal Medicine

## 2024-05-01 ENCOUNTER — Other Ambulatory Visit: Payer: Self-pay | Admitting: Internal Medicine

## 2024-05-21 ENCOUNTER — Other Ambulatory Visit: Payer: Self-pay | Admitting: Internal Medicine

## 2024-05-23 ENCOUNTER — Other Ambulatory Visit: Payer: Self-pay | Admitting: Internal Medicine

## 2024-05-23 DIAGNOSIS — F17200 Nicotine dependence, unspecified, uncomplicated: Secondary | ICD-10-CM

## 2024-06-13 ENCOUNTER — Ambulatory Visit: Admitting: Podiatry

## 2024-08-25 ENCOUNTER — Ambulatory Visit: Admitting: Internal Medicine

## 2025-03-19 ENCOUNTER — Ambulatory Visit: Admitting: Physician Assistant
